# Patient Record
Sex: Female | Born: 1990 | Race: White | Hispanic: No | Marital: Married | Smoking: Never smoker
Health system: Southern US, Community
[De-identification: ages and names within clinical notes are randomized; demographics above are authoritative.]

## PROBLEM LIST (undated history)

## (undated) ENCOUNTER — Inpatient Hospital Stay (HOSPITAL_COMMUNITY): Payer: Self-pay

## (undated) DIAGNOSIS — N809 Endometriosis, unspecified: Secondary | ICD-10-CM

## (undated) DIAGNOSIS — H8090 Unspecified otosclerosis, unspecified ear: Secondary | ICD-10-CM

## (undated) DIAGNOSIS — E039 Hypothyroidism, unspecified: Secondary | ICD-10-CM

## (undated) DIAGNOSIS — R112 Nausea with vomiting, unspecified: Secondary | ICD-10-CM

## (undated) DIAGNOSIS — T8859XA Other complications of anesthesia, initial encounter: Secondary | ICD-10-CM

## (undated) DIAGNOSIS — Z9889 Other specified postprocedural states: Secondary | ICD-10-CM

## (undated) DIAGNOSIS — R000 Tachycardia, unspecified: Secondary | ICD-10-CM

## (undated) DIAGNOSIS — I471 Supraventricular tachycardia, unspecified: Secondary | ICD-10-CM

## (undated) DIAGNOSIS — Z789 Other specified health status: Secondary | ICD-10-CM

## (undated) DIAGNOSIS — R748 Abnormal levels of other serum enzymes: Secondary | ICD-10-CM

## (undated) DIAGNOSIS — R7303 Prediabetes: Secondary | ICD-10-CM

## (undated) DIAGNOSIS — F419 Anxiety disorder, unspecified: Secondary | ICD-10-CM

## (undated) DIAGNOSIS — M1990 Unspecified osteoarthritis, unspecified site: Secondary | ICD-10-CM

## (undated) HISTORY — DX: Nausea with vomiting, unspecified: R11.2

## (undated) HISTORY — DX: Tachycardia, unspecified: R00.0

## (undated) HISTORY — PX: NO PAST SURGERIES: SHX2092

## (undated) HISTORY — DX: Endometriosis, unspecified: N80.9

## (undated) HISTORY — PX: OTHER SURGICAL HISTORY: SHX169

## (undated) HISTORY — PX: LAPAROSCOPIC REMOVAL OF MESENTERIC MASS: SHX5917

## (undated) HISTORY — DX: Unspecified otosclerosis, unspecified ear: H80.90

---
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
## 2013-12-12 LAB — OB RESULTS CONSOLE HEPATITIS B SURFACE ANTIGEN: Hepatitis B Surface Ag: NEGATIVE

## 2013-12-12 LAB — OB RESULTS CONSOLE HIV ANTIBODY (ROUTINE TESTING): HIV: NONREACTIVE

## 2013-12-12 LAB — OB RESULTS CONSOLE GC/CHLAMYDIA
Chlamydia: NEGATIVE
Gonorrhea: NEGATIVE

## 2013-12-12 LAB — OB RESULTS CONSOLE ABO/RH: RH TYPE: POSITIVE

## 2013-12-12 LAB — OB RESULTS CONSOLE ANTIBODY SCREEN: ANTIBODY SCREEN: NEGATIVE

## 2013-12-12 LAB — OB RESULTS CONSOLE RPR: RPR: NONREACTIVE

## 2013-12-12 LAB — OB RESULTS CONSOLE RUBELLA ANTIBODY, IGM: Rubella: IMMUNE

## 2014-03-08 ENCOUNTER — Inpatient Hospital Stay (HOSPITAL_COMMUNITY): Admission: AD | Admit: 2014-03-08 | Payer: Self-pay | Source: Ambulatory Visit | Admitting: Obstetrics and Gynecology

## 2014-05-22 LAB — OB RESULTS CONSOLE GBS: STREP GROUP B AG: NEGATIVE

## 2014-06-12 ENCOUNTER — Encounter (HOSPITAL_COMMUNITY): Payer: Self-pay | Admitting: *Deleted

## 2014-06-12 ENCOUNTER — Inpatient Hospital Stay (HOSPITAL_COMMUNITY)
Admission: AD | Admit: 2014-06-12 | Discharge: 2014-06-16 | DRG: 765 | Disposition: A | Discharge status: Home / Self Care | Payer: PRIVATE HEALTH INSURANCE | Source: Ambulatory Visit | Attending: Obstetrics and Gynecology | Admitting: Obstetrics and Gynecology

## 2014-06-12 DIAGNOSIS — Z3A38 38 weeks gestation of pregnancy: Secondary | ICD-10-CM | POA: Diagnosis present

## 2014-06-12 DIAGNOSIS — O1493 Unspecified pre-eclampsia, third trimester: Secondary | ICD-10-CM | POA: Diagnosis present

## 2014-06-12 DIAGNOSIS — O133 Gestational [pregnancy-induced] hypertension without significant proteinuria, third trimester: Secondary | ICD-10-CM | POA: Diagnosis present

## 2014-06-12 DIAGNOSIS — Z9889 Other specified postprocedural states: Secondary | ICD-10-CM

## 2014-06-12 DIAGNOSIS — O139 Gestational [pregnancy-induced] hypertension without significant proteinuria, unspecified trimester: Secondary | ICD-10-CM | POA: Diagnosis present

## 2014-06-12 DIAGNOSIS — I10 Essential (primary) hypertension: Secondary | ICD-10-CM | POA: Diagnosis present

## 2014-06-12 HISTORY — DX: Other specified health status: Z78.9

## 2014-06-12 LAB — LACTATE DEHYDROGENASE: LDH: 141 U/L (ref 98–192)

## 2014-06-12 LAB — COMPREHENSIVE METABOLIC PANEL
ALBUMIN: 3.2 g/dL — AB (ref 3.5–5.0)
ALT: 14 U/L (ref 14–54)
ANION GAP: 10 (ref 5–15)
AST: 23 U/L (ref 15–41)
Alkaline Phosphatase: 172 U/L — ABNORMAL HIGH (ref 38–126)
BUN: 8 mg/dL (ref 6–20)
CALCIUM: 9.1 mg/dL (ref 8.9–10.3)
CO2: 20 mmol/L — AB (ref 22–32)
CREATININE: 0.71 mg/dL (ref 0.44–1.00)
Chloride: 105 mmol/L (ref 101–111)
GFR calc non Af Amer: >60 mL/min (ref >60)
Glucose, Bld: 127 mg/dL — ABNORMAL HIGH (ref 70–99)
Potassium: 3.7 mmol/L (ref 3.5–5.1)
Sodium: 135 mmol/L (ref 135–145)
Total Bilirubin: 0.1 mg/dL — ABNORMAL LOW (ref 0.3–1.2)
Total Protein: 6.7 g/dL (ref 6.5–8.1)

## 2014-06-12 LAB — CBC
HEMATOCRIT: 37.9 % (ref 36.0–46.0)
Hemoglobin: 13 g/dL (ref 12.0–15.0)
MCH: 31.6 pg (ref 26.0–34.0)
MCHC: 34.3 g/dL (ref 30.0–36.0)
MCV: 92 fL (ref 78.0–100.0)
Platelets: 160 10*3/uL (ref 150–400)
RBC: 4.12 MIL/uL (ref 3.87–5.11)
RDW: 13.5 % (ref 11.5–15.5)
WBC: 13.3 10*3/uL — ABNORMAL HIGH (ref 4.0–10.5)

## 2014-06-12 LAB — PROTEIN / CREATININE RATIO, URINE
Creatinine, Urine: 88 mg/dL
PROTEIN CREATININE RATIO: 0.31 mg/mg{creat} — AB (ref 0.00–0.15)
Total Protein, Urine: 27 mg/dL

## 2014-06-12 LAB — URIC ACID: Uric Acid, Serum: 6.2 mg/dL (ref 2.3–6.6)

## 2014-06-12 LAB — ABO/RH: ABO/RH(D): A POS

## 2014-06-12 LAB — TYPE AND SCREEN
ABO/RH(D): A POS
Antibody Screen: NEGATIVE

## 2014-06-12 MED ORDER — DIPHENHYDRAMINE HCL 50 MG/ML IJ SOLN: 12.5000 mg | INTRAMUSCULAR | Status: DC | PRN

## 2014-06-12 MED ORDER — OXYTOCIN 40 UNITS IN LACTATED RINGERS INFUSION - SIMPLE MED
1.0000 m[IU]/min | INTRAVENOUS | Status: DC
  Administered 2014-06-13: 2 m[IU]/min via INTRAVENOUS
  Administered 2014-06-13: 4 m[IU]/min via INTRAVENOUS
  Administered 2014-06-13: 8 m[IU]/min via INTRAVENOUS
  Administered 2014-06-13: 6 m[IU]/min via INTRAVENOUS
  Filled 2014-06-12: qty 1000

## 2014-06-12 MED ORDER — TERBUTALINE SULFATE 1 MG/ML IJ SOLN: 0.2500 mg | Freq: Once | INTRAMUSCULAR | Status: AC | PRN

## 2014-06-12 MED ORDER — OXYCODONE-ACETAMINOPHEN 5-325 MG PO TABS: 1.0000 | ORAL_TABLET | ORAL | Status: DC | PRN

## 2014-06-12 MED ORDER — LACTATED RINGERS IV SOLN
500.0000 mL | INTRAVENOUS | Status: DC | PRN
  Administered 2014-06-13: 500 mL via INTRAVENOUS

## 2014-06-12 MED ORDER — LIDOCAINE HCL (PF) 1 % IJ SOLN: 30.0000 mL | INTRAMUSCULAR | Status: DC | PRN

## 2014-06-12 MED ORDER — BUTORPHANOL TARTRATE 1 MG/ML IJ SOLN
1.0000 mg | INTRAMUSCULAR | Status: DC | PRN
  Administered 2014-06-13 (×2): 1 mg via INTRAVENOUS
  Filled 2014-06-12 (×2): qty 1

## 2014-06-12 MED ORDER — ACETAMINOPHEN 325 MG PO TABS
650.0000 mg | ORAL_TABLET | ORAL | Status: DC | PRN
  Administered 2014-06-13: 650 mg via ORAL
  Filled 2014-06-12: qty 2

## 2014-06-12 MED ORDER — OXYTOCIN BOLUS FROM INFUSION: 500.0000 mL | INTRAVENOUS | Status: DC

## 2014-06-12 MED ORDER — FENTANYL 2.5 MCG/ML BUPIVACAINE 1/10 % EPIDURAL INFUSION (WH - ANES)
14.0000 mL/h | INTRAMUSCULAR | Status: DC | PRN
  Administered 2014-06-13 (×3): 14 mL/h via EPIDURAL
  Filled 2014-06-12 (×2): qty 125

## 2014-06-12 MED ORDER — BUTORPHANOL TARTRATE 1 MG/ML IJ SOLN: 1.0000 mg | Freq: Once | INTRAMUSCULAR | Status: DC

## 2014-06-12 MED ORDER — EPHEDRINE 5 MG/ML INJ: 10.0000 mg | INTRAVENOUS | Status: DC | PRN

## 2014-06-12 MED ORDER — LACTATED RINGERS IV SOLN
INTRAVENOUS | Status: DC
  Administered 2014-06-12 – 2014-06-13 (×3): via INTRAVENOUS
  Administered 2014-06-13: 125 mL via INTRAVENOUS

## 2014-06-12 MED ORDER — CITRIC ACID-SODIUM CITRATE 334-500 MG/5ML PO SOLN
30.0000 mL | ORAL | Status: DC | PRN
  Administered 2014-06-14: 30 mL via ORAL
  Filled 2014-06-12: qty 15

## 2014-06-12 MED ORDER — ONDANSETRON HCL 4 MG/2ML IJ SOLN: 4.0000 mg | Freq: Four times a day (QID) | INTRAMUSCULAR | Status: DC | PRN

## 2014-06-12 MED ORDER — PHENYLEPHRINE 40 MCG/ML (10ML) SYRINGE FOR IV PUSH (FOR BLOOD PRESSURE SUPPORT)
80.0000 ug | PREFILLED_SYRINGE | INTRAVENOUS | Status: DC | PRN
  Filled 2014-06-12: qty 20

## 2014-06-12 MED ORDER — MISOPROSTOL 25 MCG QUARTER TABLET
25.0000 ug | ORAL_TABLET | ORAL | Status: DC | PRN
  Administered 2014-06-12: 25 ug via VAGINAL
  Filled 2014-06-12: qty 0.25

## 2014-06-12 MED ORDER — OXYCODONE-ACETAMINOPHEN 5-325 MG PO TABS: 2.0000 | ORAL_TABLET | ORAL | Status: DC | PRN

## 2014-06-12 MED ORDER — OXYTOCIN 40 UNITS IN LACTATED RINGERS INFUSION - SIMPLE MED: 62.5000 mL/h | INTRAVENOUS | Status: DC

## 2014-06-12 NOTE — Progress Notes (Signed)
Raven [name] is a 24 y.o. G1P0 at [redacted]w[redacted]d by LMP admitted for induction of labor due to Hypertension.  Subjective: Patient starting to feel uncomfortable  Objective: BP 125/84 mmHg  Pulse 95  Temp(Src) 98.4 F (36.9 C) (Oral)  Resp 20  Ht 5\' 4"  (1.626 m)  Wt 101.152 kg (223 lb)  BMI 38.26 kg/m2  LMP 09/16/2013      FHT:  FHR: 130 bpm, variability: moderate,  accelerations:  Present,  decelerations:  Absent UC:   regular, every 23 minutes SVE:   Dilation: Fingertip Effacement (%): 50 Station: -3 Exam by:: Ronni Rumble, RN  Labs: Lab Results  Component Value Date   WBC 13.3* 06/12/2014   HGB 13.0 06/12/2014   HCT 37.9 06/12/2014   MCV 92.0 06/12/2014   PLT 160 06/12/2014    Assessment / Plan: Induction of labor due to gestational hypertension Patient contracting too much to place another cytotec, so foley bulb  Placed. Will start pitocin if her ctx space out  Labor: latent stage Preeclampsia:  no signs or symptoms of toxicity Fetal Wellbeing:  Category I Pain Control:  Labor support without medications I/D:  n/a Anticipated MOD:  NSVD  Raven [name] STACIA 06/12/2014, 11:21 PM

## 2014-06-12 NOTE — Progress Notes (Signed)
Dr Alwyn Pea made aware of Protein/Creat. Ration results and other labs. No orders received. Will continue to monitor.

## 2014-06-12 NOTE — Progress Notes (Signed)
Dr Alwyn Pea made aware of patient contracting too much for next dose of cytotec. Will come to bedside to assess.

## 2014-06-12 NOTE — H&P (Signed)
Raven [name] is a 24 y.o. female G1P0 at 57 weeks and 3 days presenting from office for direct admission for elevated BPs  To 150s/ 100's.  Patient with elevated BPs on several prenatal visits 130's / 90s. Today she reports Feeling like "I am dying".  She reports a headache, no blurry vision, no scotomata, no RUQ pain.  She is having irregular ctx, no vaginal bleeding no leaking of fluid, normal fetal movements.  Maternal Medical History:  Reason for admission: Nausea.  Contractions: Onset was 3-5 hours ago.   Frequency: irregular.   Duration is approximately 60 seconds.   Perceived severity is moderate.    Fetal activity: Perceived fetal activity is normal.   Last perceived fetal movement was within the past 12 hours.    Prenatal complications: PIH.   Prenatal Complications - Diabetes: none.    OB History    Gravida Para Term Preterm AB TAB SAB Ectopic Multiple Living   1              Past Medical History  Diagnosis Date  . Medical history non-contributory    Past Surgical History  Procedure Laterality Date  . No past surgeries     Family History: family history is not on file. Social History:  reports that she has never smoked. She has never used smokeless tobacco. She reports that she does not drink alcohol or use illicit drugs.   Prenatal Transfer Tool  Maternal Diabetes: No Genetic Screening: Normal Maternal Ultrasounds/Referrals: Normal Fetal Ultrasounds or other Referrals:  Other:  Anatomy US normal  Maternal Substance Abuse:  No Significant Maternal Medications:  None Significant Maternal Lab Results:  Lab values include: Group B Strep negative Other Comments:  None  Review of Systems  Constitutional: Negative for fever and chills.  Eyes: Negative for blurred vision and double vision.  Respiratory: Negative for hemoptysis.   Cardiovascular: Negative for chest pain and palpitations.  Gastrointestinal: Negative for heartburn and nausea.   Genitourinary: Negative for dysuria and urgency.  Skin: Negative for itching and rash.  Neurological: Positive for headaches. Negative for dizziness.  All other systems reviewed and are negative.   Dilation: Fingertip Effacement (%): 50 Station: -3 Exam by:: L. Cresenzo, RN Blood pressure 130/94, pulse 99, temperature 99.1 F (37.3 C), temperature source Oral, resp. rate 18, height 5\' 4"  (1.626 m), weight 101.152 kg (223 lb), last menstrual period 09/16/2013. Maternal Exam:  Uterine Assessment: Contraction strength is mild.  Contraction duration is 40 seconds. Contraction frequency is irregular.   Abdomen: Patient reports no abdominal tenderness. Fundal height is 38 cm.   Estimated fetal weight is 3200 grams.   Fetal presentation: vertex  Introitus: Normal vulva. Normal vagina.  Ferning test: not done.  Nitrazine test: not done. Amniotic fluid character: not assessed.  Pelvis: adequate for delivery.   Cervix: Cervix evaluated by digital exam.   Finger tip / long / posterior  Fetal Exam Fetal Monitor Review: Mode: hand-held doppler probe.   Baseline rate: 130.  Variability: moderate (6-25 bpm).   Pattern: accelerations present and no decelerations.    Fetal State Assessment: Category I - tracings are normal.     Physical Exam  Vitals reviewed. Constitutional: She is oriented to person, place, and time. She appears well-developed and well-nourished.  HENT:  Head: Normocephalic and atraumatic.  Eyes: Pupils are equal, round, and reactive to light.  Neck: Normal range of motion.  Cardiovascular: Normal rate and regular rhythm.   Respiratory: Effort normal.  GI:  Soft.  Genitourinary: Vagina normal.  Musculoskeletal: Normal range of motion.  Neurological: She is alert and oriented to person, place, and time. She has normal reflexes.  Skin: Skin is warm.  Psychiatric: She has a normal mood and affect. Her behavior is normal.    Prenatal labs: ABO, Rh: --/--/A POS  (05/02 1645) Antibody: NEG (05/02 1645) Rubella: Immune (11/02 0000) RPR: Nonreactive (11/02 0000)  HBsAg: Negative (11/02 0000)  HIV: Non-reactive (11/02 0000)  GBS: Negative (04/11 0000)   Assessment/Plan: 24 yo G1P0 SIUP at 38 weeks 3 days with gestational hypertension, possible preeclampsia with severe features.  Admit to labor and delivery for induction of labor Misoprostol for cervical ripening then pitocin for augmentation.  PIH labs normal except for protein / creatinine ration 0.31 Will hold on magnesium sulfate for seizure prophylaxis for now.  Epidural on demand.    Raven [name], [place] 06/12/2014, 7:32 PM

## 2014-06-13 ENCOUNTER — Inpatient Hospital Stay (HOSPITAL_COMMUNITY): Payer: PRIVATE HEALTH INSURANCE | Admitting: Anesthesiology

## 2014-06-13 LAB — CBC WITH DIFFERENTIAL/PLATELET
BASOS ABS: 0 10*3/uL (ref 0.0–0.1)
BASOS PCT: 0 % (ref 0–1)
Eosinophils Absolute: 0 10*3/uL (ref 0.0–0.7)
Eosinophils Relative: 0 % (ref 0–5)
HEMATOCRIT: 35.8 % — AB (ref 36.0–46.0)
HEMOGLOBIN: 12 g/dL (ref 12.0–15.0)
Lymphocytes Relative: 15 % (ref 12–46)
Lymphs Abs: 1.8 10*3/uL (ref 0.7–4.0)
MCH: 31.4 pg (ref 26.0–34.0)
MCHC: 33.5 g/dL (ref 30.0–36.0)
MCV: 93.7 fL (ref 78.0–100.0)
MONO ABS: 0.9 10*3/uL (ref 0.1–1.0)
Monocytes Relative: 7 % (ref 3–12)
Neutro Abs: 9.9 10*3/uL — ABNORMAL HIGH (ref 1.7–7.7)
Neutrophils Relative %: 78 % — ABNORMAL HIGH (ref 43–77)
Platelets: 139 10*3/uL — ABNORMAL LOW (ref 150–400)
RBC: 3.82 MIL/uL — ABNORMAL LOW (ref 3.87–5.11)
RDW: 13.8 % (ref 11.5–15.5)
WBC: 12.6 10*3/uL — ABNORMAL HIGH (ref 4.0–10.5)

## 2014-06-13 LAB — RPR: RPR Ser Ql: NONREACTIVE

## 2014-06-13 MED ORDER — ZOLPIDEM TARTRATE 5 MG PO TABS
5.0000 mg | ORAL_TABLET | Freq: Once | ORAL | Status: AC
  Administered 2014-06-13: 5 mg via ORAL
  Filled 2014-06-13: qty 1

## 2014-06-13 MED ORDER — FENTANYL 2.5 MCG/ML BUPIVACAINE 1/10 % EPIDURAL INFUSION (WH - ANES): 14.0000 mL/h | INTRAMUSCULAR | Status: DC | PRN

## 2014-06-13 MED ORDER — LIDOCAINE HCL (PF) 1 % IJ SOLN
INTRAMUSCULAR | Status: DC | PRN
  Administered 2014-06-13: 10 mL

## 2014-06-13 NOTE — Anesthesia Procedure Notes (Signed)
Epidural Patient location during procedure: OB Start time: 06/13/2014 12:02 PM End time: 06/13/2014 12:17 PM  Staffing Anesthesiologist: Alexis Frock  Preanesthetic Checklist Completed: patient identified, site marked, surgical consent, pre-op evaluation, timeout performed, IV checked, risks and benefits discussed and monitors and equipment checked  Epidural Patient position: sitting Prep: site prepped and draped and DuraPrep Patient monitoring: heart rate, continuous pulse ox and blood pressure Approach: midline Location: L3-L4 Injection technique: LOR saline and LOR air  Needle:  Needle type: Tuohy  Needle gauge: 17 G Needle length: 9 cm and 9 Needle insertion depth: 6 cm Catheter type: closed end flexible Catheter size: 19 Gauge Catheter at skin depth: 14 cm Test dose: negative  Assessment Events: blood not aspirated, injection not painful, no injection resistance, negative IV test and no paresthesia  Additional Notes   Patient tolerated the insertion well without complications.Reason for block:procedure for pain

## 2014-06-13 NOTE — Progress Notes (Signed)
Removed foley bulb-  SVE ft to 1/50/high [place] bedside VTX  FHTs 120s gSTV, NST R Toco q 5  Will start pitocin.

## 2014-06-13 NOTE — Progress Notes (Signed)
RN Assuming care of patient

## 2014-06-13 NOTE — Progress Notes (Signed)
3/70/-2 SROM earlier at 10 am, clear NST R

## 2014-06-13 NOTE — Anesthesia Preprocedure Evaluation (Signed)
Anesthesia Evaluation  Patient identified by MRN, date of birth, ID band Patient awake and Patient confused    Reviewed: Allergy & Precautions, H&P , NPO status , Patient's Chart, lab work & pertinent test results  Airway Mallampati: II       Dental   Pulmonary  breath sounds clear to auscultation  Pulmonary exam normal       Cardiovascular Exercise Tolerance: Good hypertension, Rhythm:regular Rate:Normal     Neuro/Psych    GI/Hepatic   Endo/Other    Renal/GU      Musculoskeletal   Abdominal Normal abdominal exam  (+)   Peds  Hematology   Anesthesia Other Findings   Reproductive/Obstetrics (+) Pregnancy Mild HTN                             Anesthesia Physical Anesthesia Plan  ASA: II  Anesthesia Plan: Epidural   Post-op Pain Management:    Induction:   Airway Management Planned:   Additional Equipment:   Intra-op Plan:   Post-operative Plan:   Informed Consent: I have reviewed the patients History and Physical, chart, labs and discussed the procedure including the risks, benefits and alternatives for the proposed anesthesia with the patient or authorized representative who has indicated his/her understanding and acceptance.     Plan Discussed with:   Anesthesia Plan Comments:         Anesthesia Quick Evaluation

## 2014-06-14 ENCOUNTER — Encounter (HOSPITAL_COMMUNITY): Payer: Self-pay | Admitting: *Deleted

## 2014-06-14 ENCOUNTER — Encounter (HOSPITAL_COMMUNITY)
Admission: AD | Disposition: A | Discharge status: Home / Self Care | Payer: Self-pay | Source: Ambulatory Visit | Attending: Obstetrics and Gynecology

## 2014-06-14 DIAGNOSIS — Z9889 Other specified postprocedural states: Secondary | ICD-10-CM

## 2014-06-14 LAB — CBC
HCT: 33.3 % — ABNORMAL LOW (ref 36.0–46.0)
HCT: 29.8 % — ABNORMAL LOW (ref 36.0–46.0)
HEMOGLOBIN: 10.9 g/dL — AB (ref 12.0–15.0)
HEMOGLOBIN: 9.9 g/dL — AB (ref 12.0–15.0)
MCH: 31 pg (ref 26.0–34.0)
MCH: 31.4 pg (ref 26.0–34.0)
MCHC: 32.7 g/dL (ref 30.0–36.0)
MCHC: 33.2 g/dL (ref 30.0–36.0)
MCV: 94.6 fL (ref 78.0–100.0)
MCV: 94.6 fL (ref 78.0–100.0)
Platelets: 128 10*3/uL — ABNORMAL LOW (ref 150–400)
Platelets: 172 10*3/uL (ref 150–400)
RBC: 3.52 MIL/uL — AB (ref 3.87–5.11)
RBC: 3.15 MIL/uL — ABNORMAL LOW (ref 3.87–5.11)
RDW: 13.5 % (ref 11.5–15.5)
RDW: 13.6 % (ref 11.5–15.5)
WBC: 16.8 10*3/uL — AB (ref 4.0–10.5)
WBC: 18.8 10*3/uL — ABNORMAL HIGH (ref 4.0–10.5)

## 2014-06-14 SURGERY — Surgical Case
Anesthesia: Regional

## 2014-06-14 MED ORDER — FLEET ENEMA 7-19 GM/118ML RE ENEM: 1.0000 | ENEMA | Freq: Every day | RECTAL | Status: DC | PRN

## 2014-06-14 MED ORDER — NALBUPHINE HCL 10 MG/ML IJ SOLN: 5.0000 mg | Freq: Once | INTRAMUSCULAR | Status: AC | PRN

## 2014-06-14 MED ORDER — IBUPROFEN 600 MG PO TABS
600.0000 mg | ORAL_TABLET | Freq: Four times a day (QID) | ORAL | Status: DC
  Administered 2014-06-14 – 2014-06-16 (×9): 600 mg via ORAL
  Filled 2014-06-14 (×9): qty 1

## 2014-06-14 MED ORDER — MEPERIDINE HCL 25 MG/ML IJ SOLN
INTRAMUSCULAR | Status: AC
  Filled 2014-06-14: qty 1

## 2014-06-14 MED ORDER — ACETAMINOPHEN 325 MG PO TABS: 650.0000 mg | ORAL_TABLET | ORAL | Status: DC | PRN

## 2014-06-14 MED ORDER — OXYTOCIN 10 UNIT/ML IJ SOLN
40.0000 [IU] | INTRAVENOUS | Status: DC | PRN
  Administered 2014-06-14: 40 [IU] via INTRAVENOUS

## 2014-06-14 MED ORDER — DEXAMETHASONE SODIUM PHOSPHATE 10 MG/ML IJ SOLN
INTRAMUSCULAR | Status: DC | PRN
  Administered 2014-06-14: 10 mg via INTRAVENOUS

## 2014-06-14 MED ORDER — SIMETHICONE 80 MG PO CHEW
80.0000 mg | CHEWABLE_TABLET | ORAL | Status: DC
  Administered 2014-06-14 – 2014-06-16 (×2): 80 mg via ORAL
  Filled 2014-06-14 (×2): qty 1

## 2014-06-14 MED ORDER — ONDANSETRON HCL 4 MG/2ML IJ SOLN
INTRAMUSCULAR | Status: DC | PRN
  Administered 2014-06-14: 4 mg via INTRAVENOUS

## 2014-06-14 MED ORDER — LANOLIN HYDROUS EX OINT: 1.0000 "application " | TOPICAL_OINTMENT | CUTANEOUS | Status: DC | PRN

## 2014-06-14 MED ORDER — ONDANSETRON HCL 4 MG/2ML IJ SOLN: 4.0000 mg | Freq: Three times a day (TID) | INTRAMUSCULAR | Status: DC | PRN

## 2014-06-14 MED ORDER — SENNOSIDES-DOCUSATE SODIUM 8.6-50 MG PO TABS
2.0000 | ORAL_TABLET | ORAL | Status: DC
  Administered 2014-06-14 – 2014-06-16 (×2): 2 via ORAL
  Filled 2014-06-14 (×2): qty 2

## 2014-06-14 MED ORDER — NALBUPHINE HCL 10 MG/ML IJ SOLN: 5.0000 mg | INTRAMUSCULAR | Status: DC | PRN

## 2014-06-14 MED ORDER — MORPHINE SULFATE 0.5 MG/ML IJ SOLN
INTRAMUSCULAR | Status: AC
  Filled 2014-06-14: qty 10

## 2014-06-14 MED ORDER — SODIUM BICARBONATE 8.4 % IV SOLN
INTRAVENOUS | Status: AC
  Filled 2014-06-14: qty 50

## 2014-06-14 MED ORDER — OXYTOCIN 40 UNITS IN LACTATED RINGERS INFUSION - SIMPLE MED
62.5000 mL/h | INTRAVENOUS | Status: AC
Start: 2014-06-14 — End: 2014-06-14

## 2014-06-14 MED ORDER — MEPERIDINE HCL 25 MG/ML IJ SOLN
INTRAMUSCULAR | Status: DC | PRN
  Administered 2014-06-14 (×2): 12.5 mg via INTRAVENOUS

## 2014-06-14 MED ORDER — SIMETHICONE 80 MG PO CHEW
80.0000 mg | CHEWABLE_TABLET | Freq: Three times a day (TID) | ORAL | Status: DC
  Administered 2014-06-14 – 2014-06-16 (×6): 80 mg via ORAL
  Filled 2014-06-14 (×7): qty 1

## 2014-06-14 MED ORDER — OXYCODONE-ACETAMINOPHEN 5-325 MG PO TABS: 2.0000 | ORAL_TABLET | ORAL | Status: DC | PRN

## 2014-06-14 MED ORDER — METHYLERGONOVINE MALEATE 0.2 MG PO TABS: 0.2000 mg | ORAL_TABLET | ORAL | Status: DC | PRN

## 2014-06-14 MED ORDER — LIDOCAINE-EPINEPHRINE (PF) 2 %-1:200000 IJ SOLN
INTRAMUSCULAR | Status: AC
  Filled 2014-06-14: qty 20

## 2014-06-14 MED ORDER — OXYTOCIN 10 UNIT/ML IJ SOLN
INTRAMUSCULAR | Status: AC
  Filled 2014-06-14: qty 4

## 2014-06-14 MED ORDER — PHENYLEPHRINE 40 MCG/ML (10ML) SYRINGE FOR IV PUSH (FOR BLOOD PRESSURE SUPPORT)
PREFILLED_SYRINGE | INTRAVENOUS | Status: AC
  Filled 2014-06-14: qty 20

## 2014-06-14 MED ORDER — DIPHENHYDRAMINE HCL 25 MG PO CAPS: 25.0000 mg | ORAL_CAPSULE | Freq: Four times a day (QID) | ORAL | Status: DC | PRN

## 2014-06-14 MED ORDER — DEXTROSE 5 % IV SOLN
1.0000 ug/kg/h | INTRAVENOUS | Status: DC | PRN
  Filled 2014-06-14: qty 2

## 2014-06-14 MED ORDER — SODIUM CHLORIDE 0.9 % IJ SOLN: 3.0000 mL | INTRAMUSCULAR | Status: DC | PRN

## 2014-06-14 MED ORDER — DIPHENHYDRAMINE HCL 25 MG PO CAPS
25.0000 mg | ORAL_CAPSULE | ORAL | Status: DC | PRN
  Administered 2014-06-16: 25 mg via ORAL
  Filled 2014-06-14: qty 1

## 2014-06-14 MED ORDER — MENTHOL 3 MG MT LOZG: 1.0000 | LOZENGE | OROMUCOSAL | Status: DC | PRN

## 2014-06-14 MED ORDER — NALOXONE HCL 0.4 MG/ML IJ SOLN: 0.4000 mg | INTRAMUSCULAR | Status: DC | PRN

## 2014-06-14 MED ORDER — DEXAMETHASONE SODIUM PHOSPHATE 10 MG/ML IJ SOLN
INTRAMUSCULAR | Status: AC
  Filled 2014-06-14: qty 1

## 2014-06-14 MED ORDER — ONDANSETRON HCL 4 MG/2ML IJ SOLN
INTRAMUSCULAR | Status: AC
  Filled 2014-06-14: qty 2

## 2014-06-14 MED ORDER — MEPERIDINE HCL 25 MG/ML IJ SOLN: 6.2500 mg | INTRAMUSCULAR | Status: DC | PRN

## 2014-06-14 MED ORDER — OXYCODONE-ACETAMINOPHEN 5-325 MG PO TABS
1.0000 | ORAL_TABLET | ORAL | Status: DC | PRN
  Administered 2014-06-15 – 2014-06-16 (×4): 1 via ORAL
  Filled 2014-06-14 (×4): qty 1

## 2014-06-14 MED ORDER — SODIUM BICARBONATE 8.4 % IV SOLN
INTRAVENOUS | Status: DC | PRN
  Administered 2014-06-14: 6 mL via EPIDURAL
  Administered 2014-06-14: 4 mL via EPIDURAL
  Administered 2014-06-14: 5 mL via EPIDURAL

## 2014-06-14 MED ORDER — FENTANYL CITRATE (PF) 100 MCG/2ML IJ SOLN
25.0000 ug | INTRAMUSCULAR | Status: DC | PRN
  Administered 2014-06-14 (×2): 50 ug via INTRAVENOUS

## 2014-06-14 MED ORDER — LACTATED RINGERS IV SOLN
INTRAVENOUS | Status: DC | PRN
  Administered 2014-06-14: 03:00:00 via INTRAVENOUS

## 2014-06-14 MED ORDER — SCOPOLAMINE 1 MG/3DAYS TD PT72
MEDICATED_PATCH | TRANSDERMAL | Status: DC | PRN
  Administered 2014-06-14: 1 via TRANSDERMAL

## 2014-06-14 MED ORDER — FENTANYL CITRATE (PF) 100 MCG/2ML IJ SOLN
INTRAMUSCULAR | Status: AC
  Administered 2014-06-14: 50 ug via INTRAVENOUS
  Filled 2014-06-14: qty 2

## 2014-06-14 MED ORDER — LACTATED RINGERS IV SOLN
INTRAVENOUS | Status: DC
  Administered 2014-06-14: 17:00:00 via INTRAVENOUS

## 2014-06-14 MED ORDER — WITCH HAZEL-GLYCERIN EX PADS: 1.0000 "application " | MEDICATED_PAD | CUTANEOUS | Status: DC | PRN

## 2014-06-14 MED ORDER — TETANUS-DIPHTH-ACELL PERTUSSIS 5-2.5-18.5 LF-MCG/0.5 IM SUSP: 0.5000 mL | Freq: Once | INTRAMUSCULAR | Status: DC

## 2014-06-14 MED ORDER — PRENATAL MULTIVITAMIN CH
1.0000 | ORAL_TABLET | Freq: Every day | ORAL | Status: DC
  Administered 2014-06-14 – 2014-06-15 (×2): 1 via ORAL
  Filled 2014-06-14 (×3): qty 1

## 2014-06-14 MED ORDER — FERROUS SULFATE 325 (65 FE) MG PO TABS
325.0000 mg | ORAL_TABLET | Freq: Two times a day (BID) | ORAL | Status: DC
  Administered 2014-06-14 – 2014-06-15 (×4): 325 mg via ORAL
  Filled 2014-06-14 (×5): qty 1

## 2014-06-14 MED ORDER — MEASLES, MUMPS & RUBELLA VAC ~~LOC~~ INJ
0.5000 mL | INJECTION | Freq: Once | SUBCUTANEOUS | Status: DC
  Filled 2014-06-14: qty 0.5

## 2014-06-14 MED ORDER — ONDANSETRON HCL 4 MG/2ML IJ SOLN: 4.0000 mg | Freq: Once | INTRAMUSCULAR | Status: DC | PRN

## 2014-06-14 MED ORDER — METHYLERGONOVINE MALEATE 0.2 MG/ML IJ SOLN: 0.2000 mg | INTRAMUSCULAR | Status: DC | PRN

## 2014-06-14 MED ORDER — SCOPOLAMINE 1 MG/3DAYS TD PT72
MEDICATED_PATCH | TRANSDERMAL | Status: AC
  Filled 2014-06-14: qty 1

## 2014-06-14 MED ORDER — CEFAZOLIN SODIUM-DEXTROSE 2-3 GM-% IV SOLR
INTRAVENOUS | Status: AC
  Filled 2014-06-14: qty 50

## 2014-06-14 MED ORDER — PHENYLEPHRINE HCL 10 MG/ML IJ SOLN
INTRAMUSCULAR | Status: DC | PRN
  Administered 2014-06-14 (×2): 80 ug via INTRAVENOUS

## 2014-06-14 MED ORDER — LACTATED RINGERS IV SOLN
INTRAVENOUS | Status: DC | PRN
  Administered 2014-06-14 (×3): via INTRAVENOUS

## 2014-06-14 MED ORDER — MORPHINE SULFATE (PF) 0.5 MG/ML IJ SOLN
INTRAMUSCULAR | Status: DC | PRN
  Administered 2014-06-14: 1 mg via INTRAVENOUS
  Administered 2014-06-14: 4 mg via EPIDURAL

## 2014-06-14 MED ORDER — SIMETHICONE 80 MG PO CHEW: 80.0000 mg | CHEWABLE_TABLET | ORAL | Status: DC | PRN

## 2014-06-14 MED ORDER — DIPHENHYDRAMINE HCL 50 MG/ML IJ SOLN: 12.5000 mg | INTRAMUSCULAR | Status: DC | PRN

## 2014-06-14 MED ORDER — FAMOTIDINE 20 MG PO TABS
20.0000 mg | ORAL_TABLET | Freq: Two times a day (BID) | ORAL | Status: DC
  Administered 2014-06-14 – 2014-06-15 (×4): 20 mg via ORAL
  Filled 2014-06-14 (×4): qty 1

## 2014-06-14 MED ORDER — PHENYLEPHRINE 8 MG IN D5W 100 ML (0.08MG/ML) PREMIX OPTIME
INJECTION | INTRAVENOUS | Status: DC | PRN
  Administered 2014-06-14: 50 ug/min via INTRAVENOUS

## 2014-06-14 MED ORDER — DIBUCAINE 1 % RE OINT: 1.0000 "application " | TOPICAL_OINTMENT | RECTAL | Status: DC | PRN

## 2014-06-14 MED ORDER — BISACODYL 10 MG RE SUPP: 10.0000 mg | Freq: Every day | RECTAL | Status: DC | PRN

## 2014-06-14 MED ORDER — SCOPOLAMINE 1 MG/3DAYS TD PT72
1.0000 | MEDICATED_PATCH | Freq: Once | TRANSDERMAL | Status: DC
  Filled 2014-06-14: qty 1

## 2014-06-14 MED ORDER — ZOLPIDEM TARTRATE 5 MG PO TABS: 5.0000 mg | ORAL_TABLET | Freq: Every evening | ORAL | Status: DC | PRN

## 2014-06-14 SURGICAL SUPPLY — 33 items
APL SKNCLS STERI-STRIP NONHPOA (GAUZE/BANDAGES/DRESSINGS) ×1
BENZOIN TINCTURE PRP APPL 2/3 (GAUZE/BANDAGES/DRESSINGS) ×3 IMPLANT
CLAMP CORD UMBIL (MISCELLANEOUS) IMPLANT
CLOSURE STERI STRIP 1/2 X4 (GAUZE/BANDAGES/DRESSINGS) ×2 IMPLANT
CLOTH BEACON ORANGE TIMEOUT ST (SAFETY) ×3 IMPLANT
DRAPE SHEET LG 3/4 BI-LAMINATE (DRAPES) IMPLANT
DRSG OPSITE POSTOP 4X10 (GAUZE/BANDAGES/DRESSINGS) ×3 IMPLANT
DURAPREP 26ML APPLICATOR (WOUND CARE) ×3 IMPLANT
ELECT REM PT RETURN 9FT ADLT (ELECTROSURGICAL) ×3
ELECTRODE REM PT RTRN 9FT ADLT (ELECTROSURGICAL) ×1 IMPLANT
EXTRACTOR VACUUM BELL STYLE (SUCTIONS) IMPLANT
GLOVE BIO SURGEON STRL SZ7 (GLOVE) ×3 IMPLANT
GOWN STRL REUS W/TWL LRG LVL3 (GOWN DISPOSABLE) ×6 IMPLANT
KIT ABG SYR 3ML LUER SLIP (SYRINGE) IMPLANT
NDL HYPO 25X5/8 SAFETYGLIDE (NEEDLE) IMPLANT
NEEDLE HYPO 25X5/8 SAFETYGLIDE (NEEDLE) IMPLANT
NS IRRIG 1000ML POUR BTL (IV SOLUTION) ×3 IMPLANT
PACK C SECTION WH (CUSTOM PROCEDURE TRAY) ×3 IMPLANT
PAD OB MATERNITY 4.3X12.25 (PERSONAL CARE ITEMS) ×3 IMPLANT
RTRCTR C-SECT PINK 25CM LRG (MISCELLANEOUS) ×3 IMPLANT
STAPLER VISISTAT 35W (STAPLE) IMPLANT
STRIP CLOSURE SKIN 1/2X4 (GAUZE/BANDAGES/DRESSINGS) ×2 IMPLANT
SUT MNCRL 0 VIOLET CTX 36 (SUTURE) ×2 IMPLANT
SUT MONOCRYL 0 CTX 36 (SUTURE) ×4
SUT PDS AB 0 CTX 60 (SUTURE) IMPLANT
SUT PLAIN 2 0 XLH (SUTURE) IMPLANT
SUT VIC AB 0 CT1 27 (SUTURE) ×6
SUT VIC AB 0 CT1 27XBRD ANBCTR (SUTURE) ×2 IMPLANT
SUT VIC AB 2-0 CT1 27 (SUTURE) ×3
SUT VIC AB 2-0 CT1 TAPERPNT 27 (SUTURE) ×1 IMPLANT
SUT VIC AB 4-0 KS 27 (SUTURE) ×3 IMPLANT
TOWEL OR 17X24 6PK STRL BLUE (TOWEL DISPOSABLE) ×3 IMPLANT
TRAY FOLEY CATH SILVER 14FR (SET/KITS/TRAYS/PACK) ×3 IMPLANT

## 2014-06-14 NOTE — Progress Notes (Signed)
FHTs had been reassuring but for last hour have changed.  Now 150s with decrease long term variability and persistent late decels.  SVE is still 3-4.  All R/B/Alt d/w pt about LTCS for nonreassuing FHTs remote from vaginal delivery and she agrees to proceed.

## 2014-06-14 NOTE — Lactation Note (Signed)
This note was copied from the chart of Raven [name]. Lactation Consultation Note  Patient Name: [name] [id number] Date: 06/14/2014 Reason for consult: Initial assessment;Difficult latch RN reports baby is not sustaining a latch. LC assisted mom with positioning and latching baby. Baby is tongue thrusting and sucking his tongue/lips. Baby has difficulty sustaining a good suckling pattern. Mom nipple/aerola is very compressible and with tea cup hold baby would latch off/on and give few good suckling bursts then come off the breast. Tried #20 nipple shield but baby could not obtain depth with nipple shield. Tried several positions including laid back but baby still had trouble sustaining depth with the latch. Baby nursed off/on for 15-20 minutes.  Suck training demonstrated, Mom given shells to wear and advised to pre-pump/hand express prior to latch. Mom has colostrum present with hand expression. Encouraged to BF with feeding ques. If baby is not latching advised Mom to start pumping, she has her own DEBP to use. Advised to pump every 3 hours for 15 minutes. Mom did report this feeding baby showed more interest at the breast. Advised Mom to keep baby STS when she can. Ask for assist with feedings till baby latching consistently. Lactation brochure left for review, advised of OP services and support group. RN advised Mom will need assist tonight.   Maternal Data Has patient been taught Hand Expression?: Yes Does the patient have breastfeeding experience prior to this delivery?: No  Feeding Feeding Type: Breast Fed Length of feed: 15 min (off/on)  LATCH Score/Interventions Latch: Repeated attempts needed to sustain latch, nipple held in mouth throughout feeding, stimulation needed to elicit sucking reflex. Intervention(s): Adjust position;Assist with latch;Breast massage;Breast compression  Audible Swallowing: None  Type of Nipple: Everted at rest and after stimulation (short  shaft bilateral, left slightly inverted) Intervention(s): Shells;Hand pump  Comfort (Breast/Nipple): Soft / non-tender     Hold (Positioning): Full assist, staff holds infant at breast Intervention(s): Breastfeeding basics reviewed;Support Pillows;Position options;Skin to skin  LATCH Score: 5  Lactation Tools Discussed/Used Tools: Pump;Shells;Nipple Shields Nipple shield size: 20 Shell Type: Inverted Breast pump type: Manual WIC Program: No   Consult Status Consult Status: Follow-up Date: 06/15/14 Follow-up type: In-patient    Katrine Coho 06/14/2014, 8:28 PM

## 2014-06-14 NOTE — Progress Notes (Addendum)
Called Dr. Rogue Bussing d/t trying to get patient up to bathroom and pt is still dizzy after 14 hours post delivery.  Patient had PIH during this pregnancy and her BPs have been between the 120s/70s all day.  Reported this to Dr. Rogue Bussing and she ordered a stat CBC for now.  Urine output is good, patient is not orthostatic and has good feeling in her legs, just dizzy and unsteady on her feet. Patient has no other concerns at this time.  Will call Dr. Rogue Bussing with stat CBC results. Explained to Dr. Rogue Bussing that I did not pull foley d/t unsteady gait and dizziness, she stated it was okay to leave in until morning if need be.

## 2014-06-14 NOTE — Addendum Note (Signed)
Addendum  created 06/14/14 1413 by Asher Muir, CRNA   Modules edited: Charges VN

## 2014-06-14 NOTE — Transfer of Care (Signed)
Immediate Anesthesia Transfer of Care Note  Patient: Raven [name]  Procedure(s) Performed: Procedure(s): CESAREAN SECTION (N/A)  Patient Location: PACU  Anesthesia Type:Epidural  Level of Consciousness: awake, alert , oriented and patient cooperative  Airway & Oxygen Therapy: Patient Spontanous Breathing  Post-op Assessment: Report given to RN and Post -op Vital signs reviewed and stable  Post vital signs: Reviewed and stable  Last Vitals:  Filed Vitals:   06/14/14 0251  BP: 117/71  Pulse: 116  Temp:   Resp:     Complications: No apparent anesthesia complications

## 2014-06-14 NOTE — Op Note (Signed)
06/12/2014 - 06/14/2014  3:41 AM  PATIENT:  Raven [name]  24 y.o. female  PRE-OPERATIVE DIAGNOSIS:  Non reassuring fetal heart tone remote from vaginal delivery  POST-OPERATIVE DIAGNOSIS:  Primary Cesearan Section  PROCEDURE:  Procedure(s): CESAREAN SECTION (N/A)  SURGEON:  Surgeon(s) and Role:    * Bobbye Charleston, MD - Primary   ANESTHESIA:   epidural  EBL:  Total I/O In: 1700 [I.V.:1700] Out: 650 [Urine:100; Blood:550]  SPECIMEN:  No Specimen  DISPOSITION OF SPECIMEN:  N/A  COUNTS:  YES  TOURNIQUET:  * No tourniquets in log *  DICTATION: .Note written in EPIC  PLAN OF CARE: Admit to inpatient   PATIENT DISPOSITION:  PACU - hemodynamically stable.   Delay start of Pharmacological VTE agent (>24hrs) due to surgical blood loss or risk of bleeding: not applicable  Complications:  none Medications:  Ancef, Pitocin Findings:  Baby female, Apgars 8,9, weight P.   Normal tubes, ovaries and uterus seen.  Baby was skin to skin with mother after birth in the OR.  Technique:  After adequate epidural anesthesia was achieved, the patient was prepped and draped in usual sterile fashion.  A foley catheter was used to drain the bladder.  A pfannanstiel incision was made with the scalpel and carried down to the fascia with the bovie cautery. The fascia was incised in the midline with the scalpel and carried in a transverse curvilinear manner bilaterally.  The fascia was reflected superiorly and inferiorly off the rectus muscles and the muscles split in the midline.  A bowel free portion of the peritoneum was entered bluntly and then extended in a superior and inferior manner with good visualization of the bowel and bladder.  The Alexis instrument was then placed and the vesico-uterine fascia tented up and incised in a transverse curvilinear manner.  A 2 cm transverse incision was made in the upper portion of the lower uterine segment until the amnion was exposed.   The incision was  extended transversely in a blunt manner.  Clear fluid was noted and the baby delivered in the vertex presentation without complication.  The baby was bulb suctioned and the cord was clamped and cut.  The baby was then handed to awaiting Neonatology.  The placenta was then delivered manually and the uterus cleared of all debris.  The uterine incision was then closed with a running lock stitch of 0 monocryl.  An imbricating layer of 0 monocryl was closed as well. Excellent hemostasis of the uterine incision was achieved and the abdomen was cleared with irrigation.  The peritoneum was closed with a running stitch of 2-0 vicryl.  This incorporated the rectus muscles as a separate layer.  The fascia was then closed with a running stitch of 0 vicryl.  The subcutaneous layer was closed with interrupted  stitches of 2-0 plain gut.  The skin was closed with 4-0 vicryl on a Keith needle and steri-strips.  The patient tolerated the procedure well and was returned to the recovery room in stable condition.  All counts were correct times three.  Tron Flythe A

## 2014-06-14 NOTE — Brief Op Note (Signed)
06/12/2014 - 06/14/2014  3:41 AM  PATIENT:  Raven [name]  24 y.o. female  PRE-OPERATIVE DIAGNOSIS:  Non reassuring fetal heart tone remote from vaginal delivery  POST-OPERATIVE DIAGNOSIS:  Primary Cesearan Section  PROCEDURE:  Procedure(s): CESAREAN SECTION (N/A)  SURGEON:  Surgeon(s) and Role:    * Bobbye Charleston, MD - Primary   ANESTHESIA:   epidural  EBL:  Total I/O In: 1700 [I.V.:1700] Out: 650 [Urine:100; Blood:550]  SPECIMEN:  No Specimen  DISPOSITION OF SPECIMEN:  N/A  COUNTS:  YES  TOURNIQUET:  * No tourniquets in log *  DICTATION: .Note written in EPIC  PLAN OF CARE: Admit to inpatient   PATIENT DISPOSITION:  PACU - hemodynamically stable.   Delay start of Pharmacological VTE agent (>24hrs) due to surgical blood loss or risk of bleeding: not applicable  Complications:  none Medications:  Ancef, Pitocin Findings:  Baby female, Apgars 8,9, weight P.   Normal tubes, ovaries and uterus seen.  Baby was skin to skin with mother after birth in the OR.  Technique:  After adequate epidural anesthesia was achieved, the patient was prepped and draped in usual sterile fashion.  A foley catheter was used to drain the bladder.  A pfannanstiel incision was made with the scalpel and carried down to the fascia with the bovie cautery. The fascia was incised in the midline with the scalpel and carried in a transverse curvilinear manner bilaterally.  The fascia was reflected superiorly and inferiorly off the rectus muscles and the muscles split in the midline.  A bowel free portion of the peritoneum was entered bluntly and then extended in a superior and inferior manner with good visualization of the bowel and bladder.  The Alexis instrument was then placed and the vesico-uterine fascia tented up and incised in a transverse curvilinear manner.  A 2 cm transverse incision was made in the upper portion of the lower uterine segment until the amnion was exposed.   The incision was  extended transversely in a blunt manner.  Clear fluid was noted and the baby delivered in the vertex presentation without complication.  The baby was bulb suctioned and the cord was clamped and cut.  The baby was then handed to awaiting Neonatology.  The placenta was then delivered manually and the uterus cleared of all debris.  The uterine incision was then closed with a running lock stitch of 0 monocryl.  An imbricating layer of 0 monocryl was closed as well. Excellent hemostasis of the uterine incision was achieved and the abdomen was cleared with irrigation.  The peritoneum was closed with a running stitch of 2-0 vicryl.  This incorporated the rectus muscles as a separate layer.  The fascia was then closed with a running stitch of 0 vicryl.  The subcutaneous layer was closed with interrupted  stitches of 2-0 plain gut.  The skin was closed with 4-0 vicryl on a Keith needle and steri-strips.  The patient tolerated the procedure well and was returned to the recovery room in stable condition.  All counts were correct times three.  Berdie Malter A

## 2014-06-14 NOTE — Addendum Note (Signed)
Addendum  created 06/14/14 1412 by Asher Muir, CRNA   Modules edited: Notes Section   Notes Section:  File: [id number]

## 2014-06-14 NOTE — Progress Notes (Signed)
POD#0 Pt sleeping, spoke with family members, they state she is doing well. Advised to call RN who will call me if any issues today.  Filed Vitals:   06/14/14 0530 06/14/14 0635 06/14/14 0730 06/14/14 0830  BP: 120/86 122/76 117/67 115/69  Pulse: 104 122 122 111  Temp: 98.7 F (37.1 C) 98.6 F (37 C) 99.5 F (37.5 C) 100 F (37.8 C)  TempSrc: Oral Oral Oral Oral  Resp: 20 18 18 20   Height:      Weight:      SpO2: 96% 96% 96% 95%    Lab Results  Component Value Date   WBC 16.8* 06/14/2014   HGB 10.9* 06/14/2014   HCT 33.3* 06/14/2014   MCV 94.6 06/14/2014   PLT 128* 06/14/2014    [name]

## 2014-06-14 NOTE — Anesthesia Postprocedure Evaluation (Signed)
  Anesthesia Post-op Note  Patient: Raven [name]  Procedure(s) Performed: Procedure(s) (LRB): CESAREAN SECTION (N/A)  Patient Location: PACU  Anesthesia Type: Epidural  Level of Consciousness: awake and alert   Airway and Oxygen Therapy: Patient Spontanous Breathing  Post-op Pain: mild  Post-op Assessment: Post-op Vital signs reviewed, Patient's Cardiovascular Status Stable, Respiratory Function Stable, Patent Airway and No signs of Nausea or vomiting  Last Vitals:  Filed Vitals:   06/14/14 0530  BP: 120/86  Pulse: 104  Temp: 37.1 C  Resp: 20    Post-op Vital Signs: stable   Complications: No apparent anesthesia complications

## 2014-06-14 NOTE — Anesthesia Postprocedure Evaluation (Signed)
Anesthesia Post Note  Patient: Raven [name]  Procedure(s) Performed: Procedure(s) (LRB): CESAREAN SECTION (N/A)  Anesthesia type: Epidural  Patient location: Mother/Baby  Post pain: Pain level controlled  Post assessment: Post-op Vital signs reviewed  Last Vitals:  Filed Vitals:   06/14/14 0830  BP: 115/69  Pulse: 111  Temp: 37.8 C  Resp: 20    Post vital signs: Reviewed  Level of consciousness:alert  Complications: No apparent anesthesia complications

## 2014-06-15 ENCOUNTER — Encounter (HOSPITAL_COMMUNITY): Payer: Self-pay | Admitting: Obstetrics and Gynecology

## 2014-06-15 LAB — BIRTH TISSUE RECOVERY COLLECTION (PLACENTA DONATION)

## 2014-06-15 NOTE — Progress Notes (Signed)
Subjective: Postpartum Day 1: Cesarean Delivery Patient reports incisional pain, tolerating PO, + flatus and no problems voiding.    Objective: Vital signs in last 24 hours: Temp:  [97.7 F (36.5 C)-99.1 F (37.3 C)] 98 F (36.7 C) (05/05 0507) Pulse Rate:  [87-115] 100 (05/05 0507) Resp:  [18-20] 20 (05/05 0507) BP: (105-129)/(52-82) 110/52 mmHg (05/05 0507) SpO2:  [97 %-100 %] 97 % (05/05 0507)  Physical Exam:  General: alert, cooperative and no distress Lochia: appropriate Uterine Fundus: firm Incision: healing well, small amount of serous drainage on dressing, no dehiscence, no significant erythema DVT Evaluation: No evidence of DVT seen on physical exam.   Recent Labs  06/14/14 0415 06/14/14 1804  HGB 10.9* 9.9*  HCT 33.3* 29.8*    Assessment/Plan: Status post Cesarean section. Doing well postoperatively.  Continue current care. Will have honeycomb dressing changed Circumcision for baby today if cleared by Peds or tomorrow  [place], [place] 06/15/2014, 9:11 AM

## 2014-06-15 NOTE — Progress Notes (Signed)
Attended 'Well After Birth' group class which presented discharge care information for both Mom and Baby. 

## 2014-06-15 NOTE — Lactation Note (Signed)
This note was copied from the chart of [place]. Lactation Consultation Note  Patient Name: Raven [name] [id number] Date: 06/15/2014 Reason for consult: Follow-up assessment;Difficult latch  RN asked for LC to observe baby at the breast.  Mom seated leaning back in chair with baby in football hold.  Little pillow support under baby, and he was almost in an upright position.  No breast support, and baby sucking on nipple shield, pushing shield tip in and out of mouth.  Moved Mom to bed, and adding more pillow support with explanation.  Rolled up cloth diaper placed under breast for support.  Manual pumped for a couple minutes, nipple pulled out, and good amount of colostrum expressed.  Attempted to latch baby without a nipple shield, but baby slipping onto nipple.  Told Mom that [facility] felt that baby would grow into being able to latch without using a nipple shield.  Initiated the 20 mm nipple shield, and helped Mom to use firm breast support and compression.  Showed FOB how to un-tuck lower lip.  Baby fed with multiple swallowing.  Mom stated that the latch was much more comfortable.  Lots of teaching done.  To follow up in am.  Mom to call prn.  Encouraged continued skin to skin, and cue based feedings. Consult Status Consult Status: Follow-up Date: 06/16/14 Follow-up type: In-patient    Broadus John 06/15/2014, 5:01 PM

## 2014-06-15 NOTE — Lactation Note (Signed)
This note was copied from the chart of [place]. Lactation Consultation Note Baby wouldn't latch d/t tongue thrusting. Mom has flat nipples, has shells, and hand pump. Has own personal DEBP. Showed them how it worked and cleaning. Mom has large breast. Has been doing lots of STS. Hand expression demonstrated and hand expressed 19ml. Fitted mom w/#20 NS Inserted colostrum into NS and encouraged the "C" hold for latching/ mom has large breast. Baby latched well w/o tongue thrusting. Heard swallows. Mom had worried baby hadn't been feeding.  Applied shells to assist nipple to evert.  Patient Name: [name] [id number] Date: 06/15/2014 Reason for consult: Follow-up assessment;Difficult latch   Maternal Data    Feeding Feeding Type: Breast Fed Length of feed: 15 min (still BF)  LATCH Score/Interventions Latch: Grasps breast easily, tongue down, lips flanged, rhythmical sucking. Intervention(s): Adjust position;Assist with latch;Breast massage;Breast compression  Audible Swallowing: A few with stimulation Intervention(s): Skin to skin;Hand expression Intervention(s): Hand expression;Alternate breast massage  Type of Nipple: Flat Intervention(s): Hand pump;Double electric pump;Shells  Comfort (Breast/Nipple): Soft / non-tender     Hold (Positioning): Assistance needed to correctly position infant at breast and maintain latch. Intervention(s): Breastfeeding basics reviewed;Support Pillows;Position options;Skin to skin  LATCH Score: 7  Lactation Tools Discussed/Used Tools: Shells;Nipple [place] Fuel;Pump Nipple shield size: 20;16 Shell Type: Inverted Breast pump type: Double-Electric Breast Pump (personal DEBP) Pump Review: Setup, frequency, and cleaning;Milk Storage Initiated by:: RN Date initiated:: 06/15/14   Consult Status Consult Status: Follow-up Date: 06/16/14 Follow-up type: In-patient    Raven [name], Elta Gu[place]oupe 06/15/2014, 6:18 AM

## 2014-06-16 MED ORDER — OXYCODONE-ACETAMINOPHEN 5-325 MG PO TABS: 2.0000 | ORAL_TABLET | ORAL | Status: DC | PRN

## 2014-06-16 NOTE — Progress Notes (Signed)
POD#2 Pt without complaints. She would like to go home today. B/Ps are good.  IMP/ Doing well Plan./ Will discharge to home.

## 2014-06-16 NOTE — Discharge Summary (Signed)
Obstetric Discharge Summary Reason for Admission: induction of labor Prenatal Procedures: NST and ultrasound Intrapartum Procedures: cesarean: low cervical, transverse Postpartum Procedures: none Complications-Operative and Postpartum: none HEMOGLOBIN  Date Value Ref Range Status  06/14/2014 9.9* 12.0 - 15.0 g/dL Final   HCT  Date Value Ref Range Status  06/14/2014 29.8* 36.0 - 46.0 % Final    Physical Exam:  General: alert Lochia: appropriate Uterine Fundus: firm Incision: healing well DVT Evaluation: No evidence of DVT seen on physical exam.  Discharge Diagnoses: Preelampsia and non reassuring FHTS  Discharge Information: Date: 06/16/2014 Activity: pelvic rest Diet: routine Medications: PNV, Ibuprofen and Percocet Condition: stable Instructions: refer to practice specific booklet Discharge to: home Follow-up Information    Follow up with HORVATH,MICHELLE A, MD In 1 month.   Specialty:  Obstetrics and Gynecology   Contact information:   [address] [place] [address] [phone]       Newborn Data: Live born female  Birth Weight: 6 lb 11.1 oz (3036 g) APGAR: 8, 9  Home with mother.  [place] E 06/16/2014, 8:23 AM

## 2014-06-16 NOTE — Lactation Note (Signed)
This note was copied from the chart of [place]. Called to Mom's room for inquiry into supplementation.  Mom very upset and tearful about baby's weight-loss, unsuccessful feedings, & worry that he isn't getting anything.  Mom's DEBP from home had been partially set up earlier in the day & we finished setting that up earlier in the night.  I had instructed Mom to try and latch the baby and then to post-pump with each feeding followed by hand expression, giving any colostrum to the baby.  Mom states she tried that earlier and that the baby wouldn't latch.  I explained LEAD, and the different options for supplementing, including finger feeding and putting formula into the nipple shield with a curved tip syringe.  She stated "I just want to give him a bottle".  I explained formula feeding amounts and guidelines and encouraged Mom to continue to try and latch the baby to the breast first, then pump and give whatever colostrum she gets back to the baby, with formula being the last option.

## 2014-06-16 NOTE — Lactation Note (Signed)
This note was copied from the chart of [facility]. Lactation Consultation Note: Mother states that infant is feeding well. She states she is using a #20 nipple shield. She is seeing small amts of colostrum in the shield. Mother is pumping every 2-3 hours with her own electric pump. Mother is supplementing infant with formula. Parents given supplemental guidelines. Advised to increase amts of formula given until mothers milk is in . Reviewed need to breastfeed infant 8-12 times in 24 hours. Advised in cue base feeding and cluster feeding. Mother informed of outpatient visit. Mother states she will phone when she gets home. Discussed treatment for severe engorgement. Mother receptive to all teaching.   Patient Name: Raven [name] SELTR'V Date: 06/16/2014     Maternal Data    Feeding Feeding Type: Formula  LATCH Score/Interventions                      Lactation Tools Discussed/Used     Consult Status      Darla Lesches 06/16/2014, 12:36 PM

## 2014-06-17 ENCOUNTER — Inpatient Hospital Stay (HOSPITAL_COMMUNITY)
Admission: AD | Admit: 2014-06-17 | Discharge: 2014-06-17 | Disposition: A | Discharge status: Home / Self Care | Payer: PRIVATE HEALTH INSURANCE | Source: Ambulatory Visit | Attending: Obstetrics and Gynecology | Admitting: Obstetrics and Gynecology

## 2014-06-17 ENCOUNTER — Encounter (HOSPITAL_COMMUNITY): Payer: Self-pay | Admitting: *Deleted

## 2014-06-17 DIAGNOSIS — D649 Anemia, unspecified: Secondary | ICD-10-CM | POA: Diagnosis not present

## 2014-06-17 DIAGNOSIS — N99821 Postprocedural hemorrhage and hematoma of a genitourinary system organ or structure following other procedure: Secondary | ICD-10-CM | POA: Diagnosis not present

## 2014-06-17 DIAGNOSIS — O9081 Anemia of the puerperium: Secondary | ICD-10-CM | POA: Insufficient documentation

## 2014-06-17 DIAGNOSIS — IMO0002 Reserved for concepts with insufficient information to code with codable children: Secondary | ICD-10-CM

## 2014-06-17 LAB — CBC
HCT: 26.6 % — ABNORMAL LOW (ref 36.0–46.0)
Hemoglobin: 8.6 g/dL — ABNORMAL LOW (ref 12.0–15.0)
MCH: 30.7 pg (ref 26.0–34.0)
MCHC: 32.3 g/dL (ref 30.0–36.0)
MCV: 95 fL (ref 78.0–100.0)
Platelets: 155 10*3/uL (ref 150–400)
RBC: 2.8 MIL/uL — ABNORMAL LOW (ref 3.87–5.11)
RDW: 13.4 % (ref 11.5–15.5)
WBC: 8.6 10*3/uL (ref 4.0–10.5)

## 2014-06-17 MED ORDER — KETOROLAC TROMETHAMINE 60 MG/2ML IM SOLN
60.0000 mg | Freq: Once | INTRAMUSCULAR | Status: AC
  Administered 2014-06-17: 60 mg via INTRAMUSCULAR
  Filled 2014-06-17: qty 2

## 2014-06-17 MED ORDER — METHYLERGONOVINE MALEATE 0.2 MG PO TABS: 0.2000 mg | ORAL_TABLET | Freq: Four times a day (QID) | ORAL | Status: DC

## 2014-06-17 MED ORDER — OXYCODONE-ACETAMINOPHEN 5-325 MG PO TABS
2.0000 | ORAL_TABLET | Freq: Once | ORAL | Status: AC
  Administered 2014-06-17: 2 via ORAL
  Filled 2014-06-17: qty 2

## 2014-06-17 MED ORDER — METHYLERGONOVINE MALEATE 0.2 MG PO TABS
0.2000 mg | ORAL_TABLET | Freq: Once | ORAL | Status: AC
  Administered 2014-06-17: 0.2 mg via ORAL
  Filled 2014-06-17: qty 1

## 2014-06-17 MED ORDER — OXYCODONE-ACETAMINOPHEN 5-325 MG PO TABS: 1.0000 | ORAL_TABLET | Freq: Once | ORAL | Status: DC

## 2014-06-17 MED ORDER — IBUPROFEN 600 MG PO TABS: 600.0000 mg | ORAL_TABLET | Freq: Once | ORAL | Status: DC

## 2014-06-17 MED ORDER — LACTATED RINGERS IV BOLUS (SEPSIS)
1000.0000 mL | Freq: Once | INTRAVENOUS | Status: AC
  Administered 2014-06-17: 1000 mL via INTRAVENOUS

## 2014-06-17 NOTE — MAU Provider Note (Signed)
History     CSN: [id number]  Arrival date and time: 06/17/14 0175   First Provider Initiated Contact with Patient 06/17/14 0304      Chief Complaint  Patient presents with  . Vaginal Bleeding   HPI   Ms Raven [name] is a 24 y.o. female G1P1000 status post primary cesarean section on 5/4 presents with vaginal bleeding that she noticed 1 hour prior to coming in. She has had very minimal bleeding since the surgery. She got up to use the bathroom and noted several blood clots in the toilet.  Since the bleeding she has felt very dizzy and light headed; laying on her back makes it worse.  She is due to take pain medication and states that her pain is getting really intense in her lower abdomen. She has been taking ibuprofen and percocet at home with relief.     OB History    Gravida Para Term Preterm AB TAB SAB Ectopic Multiple Living   1 1 1       0 0      Past Medical History  Diagnosis Date  . Medical history non-contributory     Past Surgical History  Procedure Laterality Date  . No past surgeries    . Cesarean section N/A 06/14/2014    Procedure: CESAREAN SECTION;  Surgeon: Bobbye Charleston, MD;  Location: [facility];  Service: Obstetrics;  Laterality: N/A;    History reviewed. No pertinent family history.  History  Substance Use Topics  . Smoking status: Never Smoker   . Smokeless tobacco: Never Used  . Alcohol Use: No    Allergies:  Allergies  Allergen Reactions  . Sulfamethoxazole-Trimethoprim Rash    Prescriptions prior to admission  Medication Sig Dispense Refill Last Dose  . ibuprofen (ADVIL,MOTRIN) 800 MG tablet Take 800 mg by mouth every 8 (eight) hours as needed.   06/16/2014 at Unknown time  . oxyCODONE-acetaminophen (PERCOCET/ROXICET) 5-325 MG per tablet Take 2 tablets by mouth every 4 (four) hours as needed (for pain scale greater than 7). 30 tablet 0 06/16/2014 at Unknown time  . Prenatal Vit-Fe Fumarate-FA (MULTIVITAMIN-PRENATAL) 27-0.8 MG TABS tablet  Take 1 tablet by mouth daily at 12 noon.   06/16/2014 at Unknown time  . ranitidine (ZANTAC) 150 MG tablet Take 150 mg by mouth 2 (two) times daily.   06/12/2014 at 0700   Results for orders placed or performed during the hospital encounter of 06/17/14 (from the past 48 hour(s))  CBC     Status: Abnormal   Collection Time: 06/17/14  3:55 AM  Result Value Ref Range   WBC 8.6 4.0 - 10.5 K/uL   RBC 2.80 (L) 3.87 - 5.11 MIL/uL   Hemoglobin 8.6 (L) 12.0 - 15.0 g/dL   HCT 26.6 (L) 36.0 - 46.0 %   MCV 95.0 78.0 - 100.0 fL   MCH 30.7 26.0 - 34.0 pg   MCHC 32.3 30.0 - 36.0 g/dL   RDW 13.4 11.5 - 15.5 %   Platelets 155 150 - 400 K/uL     Review of Systems  Constitutional: Positive for malaise/fatigue. Negative for fever and chills.  Gastrointestinal: Positive for abdominal pain (Lower abdominal pain; bilateral ).  Musculoskeletal: Positive for back pain (Lower back pain; bilateral ).  Neurological: Positive for dizziness and weakness.   Physical Exam   Blood pressure 124/85, pulse 129, temperature 98.4 F (36.9 C), temperature source Oral, resp. rate 20, last menstrual period 09/16/2013, unknown if currently breastfeeding.  Physical Exam  Constitutional:  She is oriented to person, place, and time. She appears well-developed and well-nourished. No distress.  HENT:  Head: Normocephalic.  Respiratory: Effort normal.  GI: Soft.  Genitourinary:  Cervix closed, small amount of dark red blood noted on exam glove. 1/4 of pad with dark red blood   Musculoskeletal: Normal range of motion.  Neurological: She is alert and oriented to person, place, and time.  Skin: She is not diaphoretic.  Psychiatric: Her mood appears anxious.    MAU Course  Procedures  None  MDM Percocet 2 tabs Toradol 60 mg   Discussed patients hgb with Dr. Ouida Sills at [phone]: Methergin 0.2 in MAU 1 liter bolus of LR   Significant pain relief from medication per the patient.   Assessment and Plan   A:  1.  Postoperative vaginal bleeding   2. Anemia, unspecified anemia type     P:  Discharge home in stable condition Continue pain medication at home RX: Methergin times 5 doses only Bleeding precautions Return to MAU as needed, if symptoms worsen  Follow up with OB as scheduled  Continue prenatal vitamins with Iron  Lezlie Lye, NP 06/19/2014 8:38 AM

## 2014-06-17 NOTE — MAU Note (Signed)
Pt had primary c section on 06/14/14 and was [facility]14.  Pt presents to MAU now with c/o heavy vaginal bleeding that started around 0200 this morning when she went to the bathroom and noticed large clots.  Pt states feeling dizzy as she walked to room 7 in MAU.

## 2014-06-25 ENCOUNTER — Encounter (HOSPITAL_COMMUNITY): Payer: Self-pay | Admitting: Emergency Medicine

## 2014-06-25 ENCOUNTER — Emergency Department (HOSPITAL_COMMUNITY)
Admission: EM | Admit: 2014-06-25 | Discharge: 2014-06-26 | Disposition: A | Discharge status: Home / Self Care | Payer: PRIVATE HEALTH INSURANCE | Attending: Emergency Medicine | Admitting: Emergency Medicine

## 2014-06-25 DIAGNOSIS — R112 Nausea with vomiting, unspecified: Secondary | ICD-10-CM | POA: Diagnosis present

## 2014-06-25 DIAGNOSIS — K2970 Gastritis, unspecified, without bleeding: Secondary | ICD-10-CM | POA: Diagnosis not present

## 2014-06-25 DIAGNOSIS — Z3202 Encounter for pregnancy test, result negative: Secondary | ICD-10-CM | POA: Insufficient documentation

## 2014-06-25 DIAGNOSIS — Z79899 Other long term (current) drug therapy: Secondary | ICD-10-CM | POA: Diagnosis not present

## 2014-06-25 LAB — CBC WITH DIFFERENTIAL/PLATELET
Basophils Absolute: 0 10*3/uL (ref 0.0–0.1)
Basophils Relative: 0 % (ref 0–1)
Eosinophils Absolute: 0.1 10*3/uL (ref 0.0–0.7)
Eosinophils Relative: 1 % (ref 0–5)
HCT: 38.5 % (ref 36.0–46.0)
Hemoglobin: 12.3 g/dL (ref 12.0–15.0)
LYMPHS ABS: 2.8 10*3/uL (ref 0.7–4.0)
Lymphocytes Relative: 25 % (ref 12–46)
MCH: 29.6 pg (ref 26.0–34.0)
MCHC: 31.9 g/dL (ref 30.0–36.0)
MCV: 92.8 fL (ref 78.0–100.0)
MONO ABS: 0.5 10*3/uL (ref 0.1–1.0)
Monocytes Relative: 5 % (ref 3–12)
Neutro Abs: 7.5 10*3/uL (ref 1.7–7.7)
Neutrophils Relative %: 69 % (ref 43–77)
Platelets: 407 10*3/uL — ABNORMAL HIGH (ref 150–400)
RBC: 4.15 MIL/uL (ref 3.87–5.11)
RDW: 13.1 % (ref 11.5–15.5)
WBC: 11 10*3/uL — ABNORMAL HIGH (ref 4.0–10.5)

## 2014-06-25 LAB — COMPREHENSIVE METABOLIC PANEL
ALK PHOS: 106 U/L (ref 38–126)
ALT: 17 U/L (ref 14–54)
ANION GAP: 12 (ref 5–15)
AST: 16 U/L (ref 15–41)
Albumin: 3.6 g/dL (ref 3.5–5.0)
BILIRUBIN TOTAL: 0.2 mg/dL — AB (ref 0.3–1.2)
BUN: 12 mg/dL (ref 6–20)
CALCIUM: 9.4 mg/dL (ref 8.9–10.3)
CO2: 26 mmol/L (ref 22–32)
CREATININE: 0.95 mg/dL (ref 0.44–1.00)
Chloride: 98 mmol/L — ABNORMAL LOW (ref 101–111)
GFR calc Af Amer: >60 mL/min (ref >60)
GFR calc non Af Amer: >60 mL/min (ref >60)
Glucose, Bld: 98 mg/dL (ref 65–99)
Potassium: 3.8 mmol/L (ref 3.5–5.1)
Sodium: 136 mmol/L (ref 135–145)
Total Protein: 7.5 g/dL (ref 6.5–8.1)

## 2014-06-25 LAB — URINALYSIS, ROUTINE W REFLEX MICROSCOPIC
Bilirubin Urine: NEGATIVE
Glucose, UA: NEGATIVE mg/dL
KETONES UR: NEGATIVE mg/dL
Nitrite: NEGATIVE
Protein, ur: 30 mg/dL — AB
SPECIFIC GRAVITY, URINE: 1.025 (ref 1.005–1.030)
Urobilinogen, UA: 0.2 mg/dL (ref 0.0–1.0)
pH: 7.5 (ref 5.0–8.0)

## 2014-06-25 LAB — URINE MICROSCOPIC-ADD ON

## 2014-06-25 LAB — POC URINE PREG, ED: Preg Test, Ur: NEGATIVE

## 2014-06-25 LAB — LIPASE, BLOOD: Lipase: 24 U/L (ref 22–51)

## 2014-06-25 MED ORDER — SODIUM CHLORIDE 0.9 % IV SOLN
1000.0000 mL | Freq: Once | INTRAVENOUS | Status: AC
  Administered 2014-06-25: 1000 mL via INTRAVENOUS

## 2014-06-25 MED ORDER — SODIUM CHLORIDE 0.9 % IV SOLN
1000.0000 mL | Freq: Once | INTRAVENOUS | Status: AC
  Administered 2014-06-26: 1000 mL via INTRAVENOUS

## 2014-06-25 MED ORDER — SODIUM CHLORIDE 0.9 % IV SOLN
1000.0000 mL | INTRAVENOUS | Status: DC
  Administered 2014-06-26: 1000 mL via INTRAVENOUS

## 2014-06-25 NOTE — ED Notes (Addendum)
Patient with abdominal pain, nausea and vomiting for the last 5 days.  She is 11 days post partum via cesarean.  The pain is on the right upper abdomen.  Patient seen at [facility] two days.

## 2014-06-25 NOTE — ED Provider Notes (Signed)
CSN: [id number]     Arrival date & time 06/25/14  1927 History  This chart was scribed for Linton Flemings, MD by Mercy Moore, ED scribe.  This patient was seen in room D30C/D30C and the patient's care was started at 11:36 PM.   Chief Complaint  Patient presents with  . Nausea  . Emesis   The history is provided by the patient. No language interpreter was used.   HPI Comments: Raven [name] is a 24 y.o. female who presents to the Emergency Department complaining of nausea and vomiting since a C section 11 days ago. Patient states she has been unable to tolerate solids or liquids without vomiting for the past 5 days. Patient reports constant stabbing pain in her right ribs with vomiting and reports radiation of her pain into her shoulder blades intermittently. Patient reports fever of 101F six days ago, none since. Patient shares that she has been taking Zofran, without relief. Patient evaluated previously at [facility]; at visit patient given injection of Rocephin for suspicion of bladder/kidney infection due to blood in her urine. Patient additionally treated with Phenergan: prescription called in by her GYN, who advised that she report to ED for evaluation.  Patient denies urinary symptoms. Patient denies family history of gall bladder disease.   Past Medical History  Diagnosis Date  . Medical history non-contributory    Past Surgical History  Procedure Laterality Date  . No past surgeries    . Cesarean section N/A 06/14/2014    Procedure: CESAREAN SECTION;  Surgeon: Bobbye Charleston, MD;  Location: [facility];  Service: Obstetrics;  Laterality: N/A;   No family history on file. History  Substance Use Topics  . Smoking status: Never Smoker   . Smokeless tobacco: Never Used  . Alcohol Use: No   OB History    Gravida Para Term Preterm AB TAB SAB Ectopic Multiple Living   1 1 1       0 0     Review of Systems  Constitutional: Negative for fever.  HENT: Negative for congestion and sore throat.    Eyes: Negative for pain.  Respiratory: Negative for cough, shortness of breath and wheezing.   Cardiovascular: Negative for chest pain.  Gastrointestinal: Positive for nausea, vomiting and abdominal pain. Negative for diarrhea.  Genitourinary: Negative for dysuria.  Musculoskeletal: Negative for neck pain.  Skin: Negative for rash.  Allergic/Immunologic: Negative for immunocompromised state.  Neurological: Negative for headaches.  Hematological: Negative for adenopathy.  Psychiatric/Behavioral: Negative for behavioral problems.      Allergies  Sulfamethoxazole-trimethoprim  Home Medications   Prior to Admission medications   Medication Sig Start Date End Date Taking? Authorizing Provider  ibuprofen (ADVIL,MOTRIN) 800 MG tablet Take 800 mg by mouth every 8 (eight) hours as needed.    Historical Provider, MD  methylergonovine (METHERGINE) 0.2 MG tablet Take 1 tablet (0.2 mg total) by mouth every 6 (six) hours. 06/17/14   Lezlie Lye, NP  oxyCODONE-acetaminophen (PERCOCET/ROXICET) 5-325 MG per tablet Take 2 tablets by mouth every 4 (four) hours as needed (for pain scale greater than 7). 06/16/14   Evely Gainey Millers, MD  Prenatal Vit-Fe Fumarate-FA (MULTIVITAMIN-PRENATAL) 27-0.8 MG TABS tablet Take 1 tablet by mouth daily at 12 noon.    Historical Provider, MD  ranitidine (ZANTAC) 150 MG tablet Take 150 mg by mouth 2 (two) times daily.    Historical Provider, MD   Triage Vitals: BP 125/87 mmHg  Pulse 111  Temp(Src) 98.4 F (36.9 C) (Oral)  Resp  18  Ht 5\' 4"  (1.626 m)  Wt 195 lb 2 oz (88.508 kg)  BMI 33.48 kg/m2  SpO2 100%  LMP 09/16/2013 Physical Exam  Constitutional: She is oriented to person, place, and time. She appears well-developed and well-nourished.  HENT:  Head: Normocephalic and atraumatic.  Nose: Nose normal.  Dry mucous membranes  Eyes: Conjunctivae and EOM are normal. Pupils are equal, round, and reactive to light.  Neck: Normal range of motion. Neck supple. No  JVD present. No tracheal deviation present. No thyromegaly present.  Cardiovascular: Normal rate, regular rhythm, normal heart sounds and intact distal pulses.  Exam reveals no gallop and no friction rub.   No murmur heard. Pulmonary/Chest: Effort normal and breath sounds normal. No stridor. No respiratory distress. She has no wheezes. She has no rales. She exhibits no tenderness.  Abdominal: Soft. Bowel sounds are normal. She exhibits no distension and no mass. There is no tenderness. There is no rebound and no guarding.  C-section incision clean, dry and intact.  Patient has moderate tenderness across lower abdomen.  No right upper quadrant tenderness.  Musculoskeletal: Normal range of motion. She exhibits no edema or tenderness.  Lymphadenopathy:    She has no cervical adenopathy.  Neurological: She is alert and oriented to person, place, and time. She displays normal reflexes. She exhibits normal muscle tone. Coordination normal.  Skin: Skin is warm and dry. No rash noted. No erythema. No pallor.  Psychiatric: She has a normal mood and affect. Her behavior is normal. Judgment and thought content normal.  Nursing note and vitals reviewed.   ED Course  Procedures (including critical care time)  COORDINATION OF CARE: 11:41 PM- Discussed treatment plan with patient at bedside and patient agreed to plan.   Labs Review Labs Reviewed  CBC WITH DIFFERENTIAL/PLATELET - Abnormal; Notable for the following:    WBC 11.0 (*)    Platelets 407 (*)    All other components within normal limits  COMPREHENSIVE METABOLIC PANEL - Abnormal; Notable for the following:    Chloride 98 (*)    Total Bilirubin 0.2 (*)    All other components within normal limits  URINALYSIS, ROUTINE W REFLEX MICROSCOPIC - Abnormal; Notable for the following:    APPearance CLOUDY (*)    Hgb urine dipstick LARGE (*)    Protein, ur 30 (*)    Leukocytes, UA MODERATE (*)    All other components within normal limits  URINE  MICROSCOPIC-ADD ON - Abnormal; Notable for the following:    Squamous Epithelial / LPF FEW (*)    Bacteria, UA FEW (*)    All other components within normal limits  URINE CULTURE  LIPASE, BLOOD  POC URINE PREG, ED    Imaging Review US Abdomen Limited  06/26/2014   CLINICAL DATA:  Nausea, vomiting, RIGHT upper quadrant pain for 11 days. Status post Caesarean section 11 days ago.  EXAM: US ABDOMEN LIMITED - RIGHT UPPER QUADRANT  COMPARISON:  None.  FINDINGS: Gallbladder:  No gallstones or wall thickening visualized. No sonographic Murphy sign noted.  Common bile duct:  Diameter: 4.3 mm  Liver:  No focal lesion identified. Within normal limits in parenchymal echogenicity. Hepatopetal portal vein.  IMPRESSION: Normal RIGHT upper quadrant ultrasound.   Electronically Signed   By: Elon Alas   On: 06/26/2014 02:39     EKG Interpretation None      MDM   Final diagnoses:  Nausea and vomiting, vomiting of unspecified type  Gastritis   24 year old female  11 days postpartum from C-section who presents with nausea and vomiting with eating, right upper quadrant pain.  Concern for cholelithiasis versus cholecystitis.  Patient appears clinically dehydrated.  Urine is contaminated.  Given that she is currently having lochia.  Will get in andout for culture.  Patient was seen by urgent care and given a shot of Rocephin no antibiotics since that time.  No fevers or chills.  Plan for right upper quadrant ultrasound for further evaluation.   I personally performed the services described in this documentation, which was scribed in my presence. The recorded information has been reviewed and is accurate.   U/s without gallbladder pathology.  Pt has tolerated PO here.  Will start on protonix, carafate.   Linton Flemings, MD 06/26/14 [phone]

## 2014-06-26 ENCOUNTER — Emergency Department (HOSPITAL_COMMUNITY): Payer: PRIVATE HEALTH INSURANCE

## 2014-06-26 LAB — URINE CULTURE
COLONY COUNT: NO GROWTH
CULTURE: NO GROWTH

## 2014-06-26 MED ORDER — ONDANSETRON 8 MG PO TBDP: 8.0000 mg | ORAL_TABLET | Freq: Three times a day (TID) | ORAL | Status: DC | PRN

## 2014-06-26 MED ORDER — PANTOPRAZOLE SODIUM 40 MG IV SOLR
40.0000 mg | Freq: Once | INTRAVENOUS | Status: AC
  Administered 2014-06-26: 40 mg via INTRAVENOUS
  Filled 2014-06-26: qty 40

## 2014-06-26 MED ORDER — SUCRALFATE 1 G PO TABS: 1.0000 g | ORAL_TABLET | Freq: Four times a day (QID) | ORAL | Status: DC

## 2014-06-26 MED ORDER — PANTOPRAZOLE SODIUM 20 MG PO TBEC: 40.0000 mg | DELAYED_RELEASE_TABLET | Freq: Every day | ORAL | Status: DC

## 2014-06-26 NOTE — ED Notes (Signed)
Protonix given as ordered, food given, pt feeling very nauseated at this time, MD to be notified.

## 2014-06-26 NOTE — ED Notes (Signed)
Patient transported to Ultrasound 

## 2014-06-26 NOTE — Discharge Instructions (Signed)
Take medications as prescribed.  Stick to a bland diet until feeling better.  Eat small meals until you are feeling better.  Plain pasta, chicken, no spicy or acidic foods.   Gastritis, Adult Gastritis is soreness and swelling (inflammation) of the lining of the stomach. Gastritis can develop as a sudden onset (acute) or long-term (chronic) condition. If gastritis is not treated, it can lead to stomach bleeding and ulcers. CAUSES  Gastritis occurs when the stomach lining is weak or damaged. Digestive juices from the stomach then inflame the weakened stomach lining. The stomach lining may be weak or damaged due to viral or bacterial infections. One common bacterial infection is the Helicobacter pylori infection. Gastritis can also result from excessive alcohol consumption, taking certain medicines, or having too much acid in the stomach.  SYMPTOMS  In some cases, there are no symptoms. When symptoms are present, they may include:  Pain or a burning sensation in the upper abdomen.  Nausea.  Vomiting.  An uncomfortable feeling of fullness after eating. DIAGNOSIS  Your caregiver may suspect you have gastritis based on your symptoms and a physical exam. To determine the cause of your gastritis, your caregiver may perform the following:  Blood or stool tests to check for the H pylori bacterium.  Gastroscopy. A thin, flexible tube (endoscope) is passed down the esophagus and into the stomach. The endoscope has a light and camera on the end. Your caregiver uses the endoscope to view the inside of the stomach.  Taking a tissue sample (biopsy) from the stomach to examine under a microscope. TREATMENT  Depending on the cause of your gastritis, medicines may be prescribed. If you have a bacterial infection, such as an H pylori infection, antibiotics may be given. If your gastritis is caused by too much acid in the stomach, H2 blockers or antacids may be given. Your caregiver may recommend that you stop  taking aspirin, ibuprofen, or other nonsteroidal anti-inflammatory drugs (NSAIDs). HOME CARE INSTRUCTIONS  Only take over-the-counter or prescription medicines as directed by your caregiver.  If you were given antibiotic medicines, take them as directed. Finish them even if you start to feel better.  Drink enough fluids to keep your urine clear or pale yellow.  Avoid foods and drinks that make your symptoms worse, such as:  Caffeine or alcoholic drinks.  Chocolate.  Peppermint or mint flavorings.  Garlic and onions.  Spicy foods.  Citrus fruits, such as oranges, lemons, or limes.  Tomato-based foods such as sauce, chili, salsa, and pizza.  Fried and fatty foods.  Eat small, frequent meals instead of large meals. SEEK IMMEDIATE MEDICAL CARE IF:   You have black or dark red stools.  You vomit blood or material that looks like coffee grounds.  You are unable to keep fluids down.  Your abdominal pain gets worse.  You have a fever.  You do not feel better after 1 week.  You have any other questions or concerns. MAKE SURE YOU:  Understand these instructions.  Will watch your condition.  Will get help right away if you are not doing well or get worse. Document Released: 01/21/2001 Document Revised: 07/29/2011 Document Reviewed: 03/12/2011 [facility] Patient Information 2015 [place], [place]. This information is not intended to replace advice given to you by your health care provider. Make sure you discuss any questions you have with your health care provider.  Nausea and Vomiting Nausea is a sick feeling that often comes before throwing up (vomiting). Vomiting is a reflex where stomach  contents come out of your mouth. Vomiting can cause severe loss of body fluids (dehydration). Children and elderly adults can become dehydrated quickly, especially if they also have diarrhea. Nausea and vomiting are symptoms of a condition or disease. It is important to find the cause of  your symptoms. CAUSES   Direct irritation of the stomach lining. This irritation can result from increased acid production (gastroesophageal reflux disease), infection, food poisoning, taking certain medicines (such as nonsteroidal anti-inflammatory drugs), alcohol use, or tobacco use.  Signals from the brain.These signals could be caused by a headache, heat exposure, an inner ear disturbance, increased pressure in the brain from injury, infection, a tumor, or a concussion, pain, emotional stimulus, or metabolic problems.  An obstruction in the gastrointestinal tract (bowel obstruction).  Illnesses such as diabetes, hepatitis, gallbladder problems, appendicitis, kidney problems, cancer, sepsis, atypical symptoms of a heart attack, or eating disorders.  Medical treatments such as chemotherapy and radiation.  Receiving medicine that makes you sleep (general anesthetic) during surgery. DIAGNOSIS Your caregiver may ask for tests to be done if the problems do not improve after a few days. Tests may also be done if symptoms are severe or if the reason for the nausea and vomiting is not clear. Tests may include:  Urine tests.  Blood tests.  Stool tests.  Cultures (to look for evidence of infection).  X-rays or other imaging studies. Test results can help your caregiver make decisions about treatment or the need for additional tests. TREATMENT You need to stay well hydrated. Drink frequently but in small amounts.You may wish to drink water, sports drinks, clear broth, or eat frozen ice pops or gelatin dessert to help stay hydrated.When you eat, eating slowly may help prevent nausea.There are also some antinausea medicines that may help prevent nausea. HOME CARE INSTRUCTIONS   Take all medicine as directed by your caregiver.  If you do not have an appetite, do not force yourself to eat. However, you must continue to drink fluids.  If you have an appetite, eat a normal diet unless your  caregiver tells you differently.  Eat a variety of complex carbohydrates (rice, wheat, potatoes, bread), lean meats, yogurt, fruits, and vegetables.  Avoid high-fat foods because they are more difficult to digest.  Drink enough water and fluids to keep your urine clear or pale yellow.  If you are dehydrated, ask your caregiver for specific rehydration instructions. Signs of dehydration may include:  Severe thirst.  Dry lips and mouth.  Dizziness.  Dark urine.  Decreasing urine frequency and amount.  Confusion.  Rapid breathing or pulse. SEEK IMMEDIATE MEDICAL CARE IF:   You have blood or brown flecks (like coffee grounds) in your vomit.  You have black or bloody stools.  You have a severe headache or stiff neck.  You are confused.  You have severe abdominal pain.  You have chest pain or trouble breathing.  You do not urinate at least once every 8 hours.  You develop cold or clammy skin.  You continue to vomit for longer than 24 to 48 hours.  You have a fever. MAKE SURE YOU:   Understand these instructions.  Will watch your condition.  Will get help right away if you are not doing well or get worse. Document Released: 01/27/2005 Document Revised: 04/21/2011 Document Reviewed: 06/26/2010 [facility] Patient Information 2015 [place], [place]. This information is not intended to replace advice given to you by your health care provider. Make sure you discuss any questions you have with  your health care provider.

## 2015-08-05 DIAGNOSIS — M25572 Pain in left ankle and joints of left foot: Secondary | ICD-10-CM | POA: Diagnosis not present

## 2015-08-15 DIAGNOSIS — M25572 Pain in left ankle and joints of left foot: Secondary | ICD-10-CM | POA: Diagnosis not present

## 2015-09-14 DIAGNOSIS — M25572 Pain in left ankle and joints of left foot: Secondary | ICD-10-CM | POA: Diagnosis not present

## 2015-12-05 DIAGNOSIS — M25572 Pain in left ankle and joints of left foot: Secondary | ICD-10-CM | POA: Diagnosis not present

## 2016-01-21 ENCOUNTER — Encounter (HOSPITAL_COMMUNITY): Payer: Self-pay | Admitting: Emergency Medicine

## 2016-01-21 ENCOUNTER — Ambulatory Visit (HOSPITAL_COMMUNITY)
Admission: EM | Admit: 2016-01-21 | Discharge: 2016-01-21 | Disposition: A | Discharge status: Home / Self Care | Payer: 59 | Attending: Family Medicine | Admitting: Family Medicine

## 2016-01-21 DIAGNOSIS — J040 Acute laryngitis: Secondary | ICD-10-CM | POA: Diagnosis not present

## 2016-01-21 MED ORDER — CHLORHEXIDINE GLUCONATE 0.12 % MT SOLN: 15.0000 mL | Freq: Two times a day (BID) | OROMUCOSAL | 0 refills | Status: DC

## 2016-01-21 NOTE — ED Provider Notes (Signed)
CSN: [id number]     Arrival date & time 01/21/16  1226 History   First MD Initiated Contact with Patient 01/21/16 1411     Chief Complaint  Patient presents with  . URI   (Consider location/radiation/quality/duration/timing/severity/associated sxs/prior Treatment) HPI  [name] is a 25 yo female with no significant PMH who presents with los of voice and general fatigue. Reports she had a cough about 2 weeks ago which resolved. But is still having some nasal congestion with yellow/green nasal discharge. About three days ago, she started to lose her voice and reports of feeling very tired. She also has some chest congestion and "had not had a good cough to cough up sputum"; sometime she is able to cough up some material, but no blood in her sputum. She has been using Muccinex DM but more recently Muccinex every 4 hours which has not been helping. No fevers, persistent cough, or shortness of breath, or sinus pressure/pain. Sometimes she gets a "tickle in her throat".  Eating and drinking well. She has an infant who is currently ill with bronchiolitis in the hospital. No other sick contacts.   Past Medical History:  Diagnosis Date  . Medical history non-contributory    Past Surgical History:  Procedure Laterality Date  . CESAREAN SECTION N/A 06/14/2014   Procedure: CESAREAN SECTION;  Surgeon: Bobbye Charleston, MD;  Location: [facility];  Service: Obstetrics;  Laterality: N/A;  . NO PAST SURGERIES     No family history on file. Social History  Substance Use Topics  . Smoking status: Never Smoker  . Smokeless tobacco: Never Used  . Alcohol use No   OB History    Gravida Para Term Preterm AB Living   1 1 1      0   SAB TAB Ectopic Multiple Live Births         0       Review of Systems: as noted above  Allergies  Sulfamethoxazole-trimethoprim  Home Medications   Prior to Admission medications   Medication Sig Start Date End Date Taking? Authorizing Provider  chlorhexidine (PERIDEX)  0.12 % solution Use as directed 15 mLs in the mouth or throat 2 (two) times daily. 01/21/16   Smiley Houseman, MD  pantoprazole (PROTONIX) 20 MG tablet Take 2 tablets (40 mg total) by mouth daily. 06/26/14   Linton Flemings, MD  Prenatal Vit-Fe Fumarate-FA (MULTIVITAMIN-PRENATAL) 27-0.8 MG TABS tablet Take 1 tablet by mouth daily at 12 noon.    Historical Provider, MD  sucralfate (CARAFATE) 1 G tablet Take 1 tablet (1 g total) by mouth 4 (four) times daily. 06/26/14   Linton Flemings, MD   Meds Ordered and Administered this Visit  Medications - No data to display  BP 130/93 (BP Location: Left Arm)   Pulse 105   Temp 99 F (37.2 C) (Oral)   SpO2 99%  No data found.   Physical Exam  Constitutional: She is oriented to person, place, and time. She appears well-developed and well-nourished. No distress.  HENT:  Nose: Nose normal.  Mouth/Throat: Oropharynx is clear and moist. No oropharyngeal exudate.  TMs normal bilaterally   Eyes: Conjunctivae are normal.  Neck: Normal range of motion. Neck supple.  Cardiovascular: Normal rate, regular rhythm and normal heart sounds.  Exam reveals no gallop and no friction rub.   No murmur heard. Pulmonary/Chest: Effort normal and breath sounds normal. No respiratory distress. She has no wheezes. She has no rales.  Lymphadenopathy:    She has no  cervical adenopathy.  Neurological: She is alert and oriented to person, place, and time.  Skin: Skin is warm and dry. Capillary refill takes less than 2 seconds. She is not diaphoretic.  Psychiatric: She has a normal mood and affect. Her behavior is normal.    Urgent Care Course   Clinical Course    Symptoms likely consistent with laryngitis which is likely viral in nature. Vitals are stable and no symptoms/signs of pneumonia on physical exam. Will prescribe Peridex BID x 5 days.   Procedures   Labs Review Labs Reviewed - No data to display  Imaging Review No results found.   MDM   1. Laryngitis    Likely viral in etiology. Vitals are stable and no symptoms/signs of pneumonia on physical exam. Will prescribe Peridex BID x 5 days. Return precautions discussed.   Presentation, exam, assessment and plan discussed with Dr. Joseph Art.   Smiley Houseman, MD PGY 2 Family Medicine    Smiley Houseman, MD 01/21/16 [phone]

## 2016-01-21 NOTE — Discharge Instructions (Signed)
Your symptoms are likely consistent with laryngitis which is most commonly caused by a viral infection. You can try Peridex (gargle and spit out twice a day for 5 days) which will help with loosening mucus. If you have difficulty breathing, please seek immediate medical attention.

## 2016-01-21 NOTE — ED Triage Notes (Signed)
Patient has had a cough for 2 weeks.  Non-productive cough.  Patient has lost voice for 2 days.  Denies fever.   Feeling bad in general .    Patient has a child admitted for bronchiolitis.

## 2016-03-15 IMAGING — US US ABDOMEN LIMITED
1 series · 14 of 25 positions shown · non-contrast
Comparison: None.

CLINICAL DATA: Nausea, vomiting, RIGHT upper quadrant pain for 11
days. Status post Caesarean section 11 days ago.

EXAM:
US ABDOMEN LIMITED - RIGHT UPPER QUADRANT

[Series 1: us abdomen limited · 0.27mm/px · 14 of 38 slices shown]
[im 1/38]
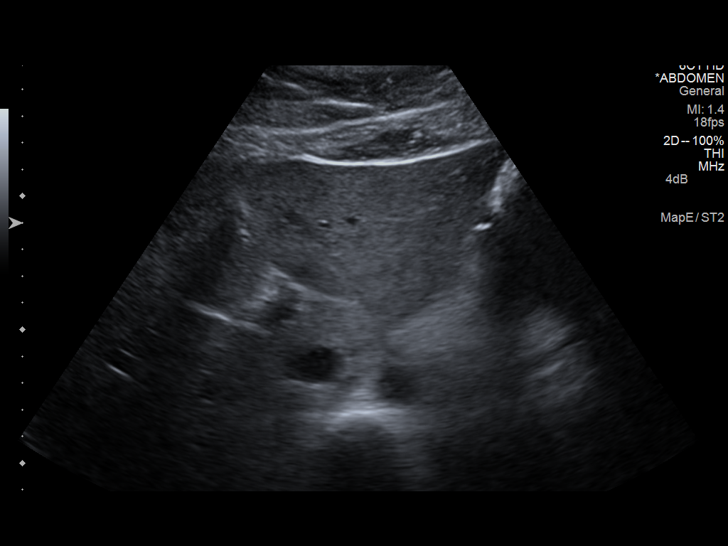
[im 4/38]
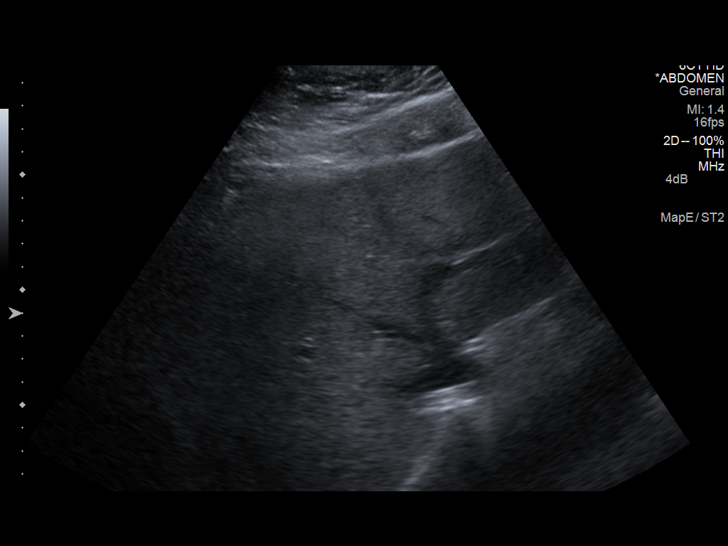
[im 7/38]
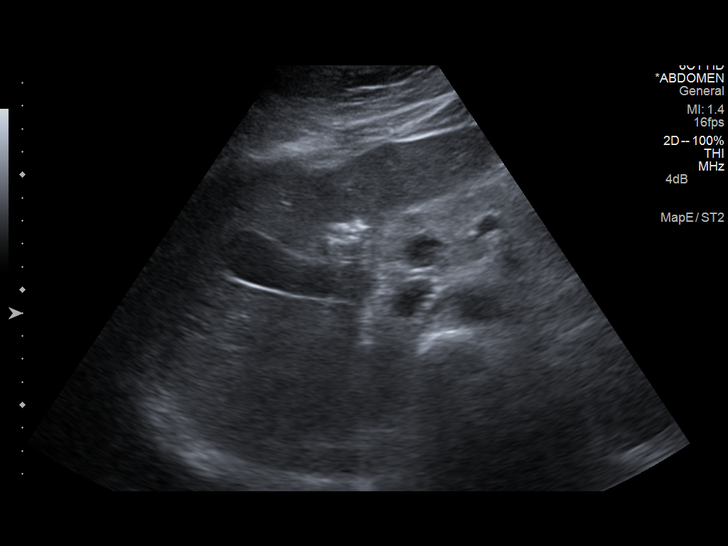
[im 10/38]
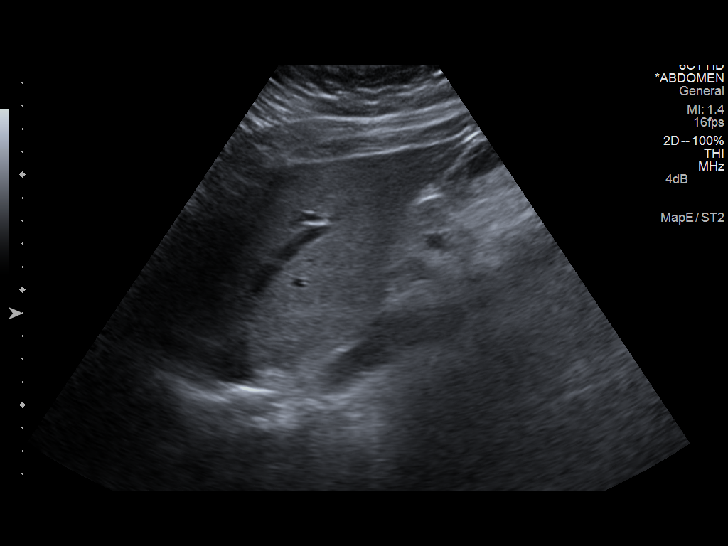
[im 13/38]
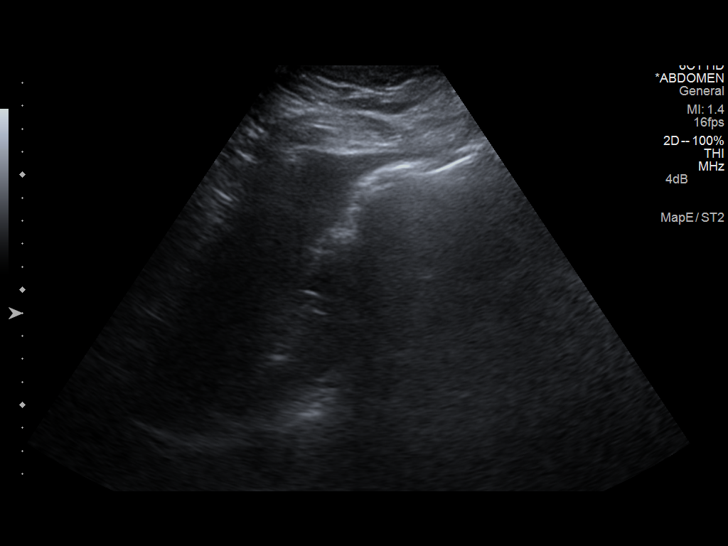
[im 14/38]
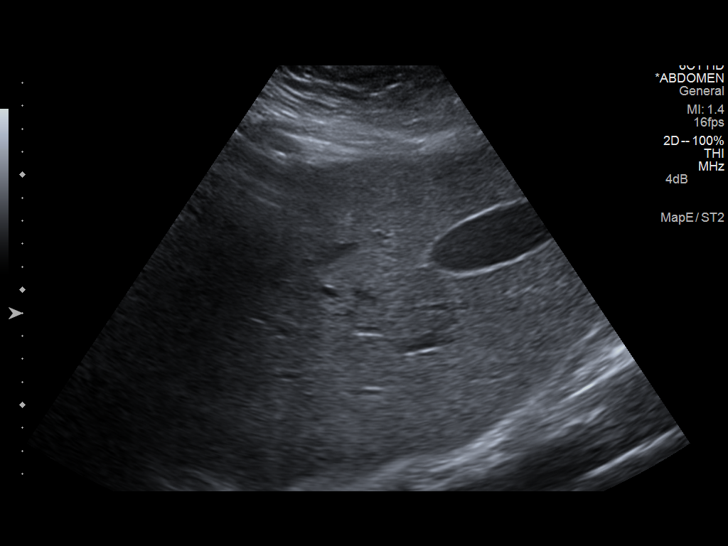
[im 17/38]
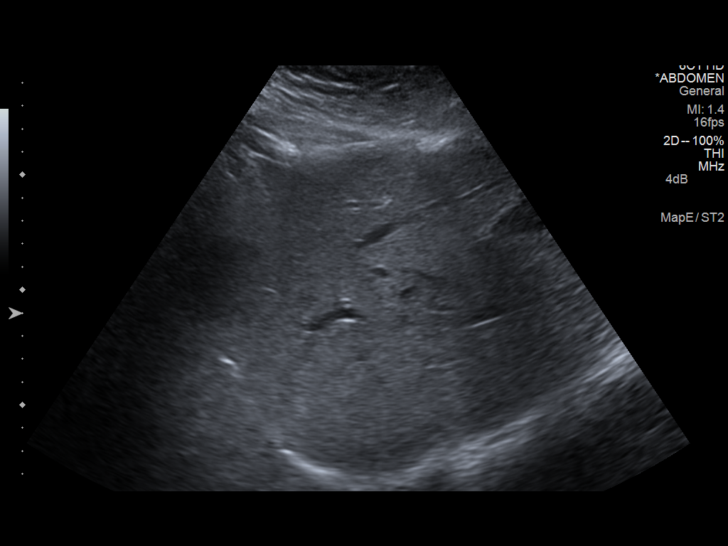
[im 21/38]
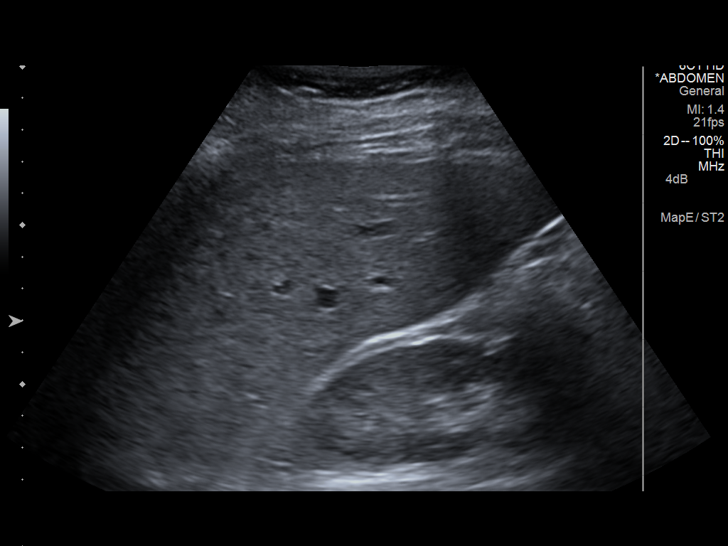
[im 24/38]
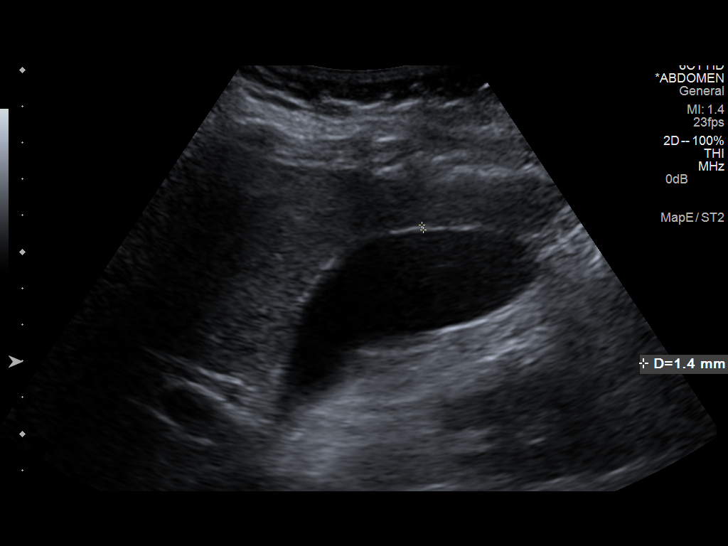
[im 25/38]
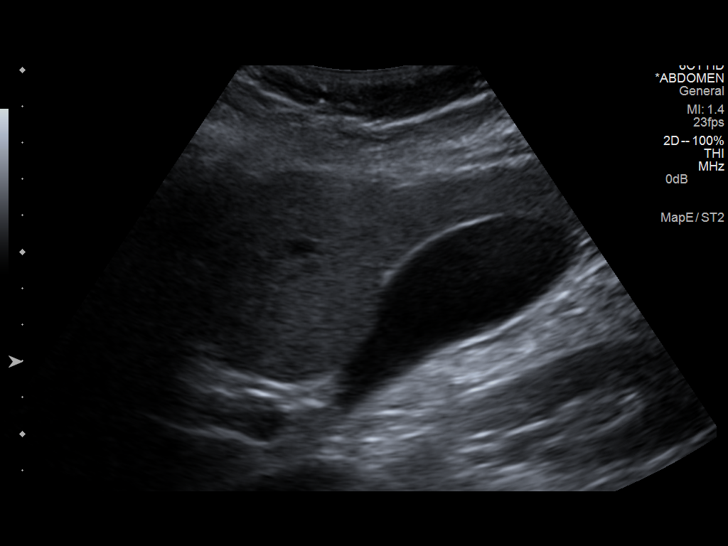
[im 28/38]
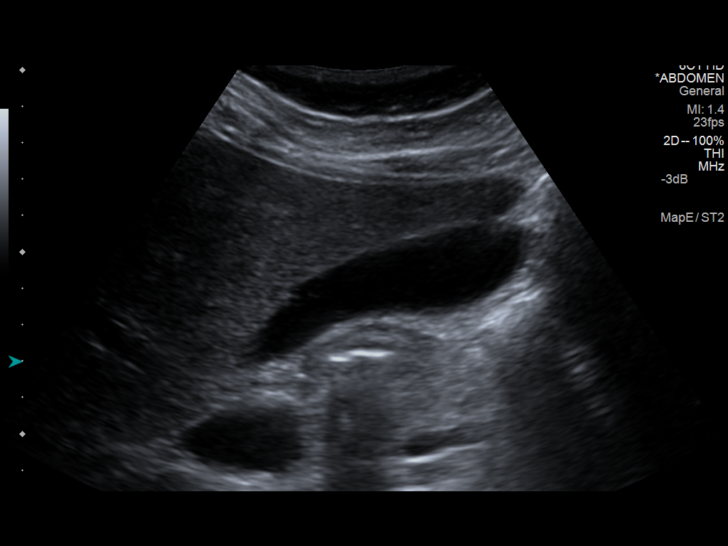
[im 31/38]
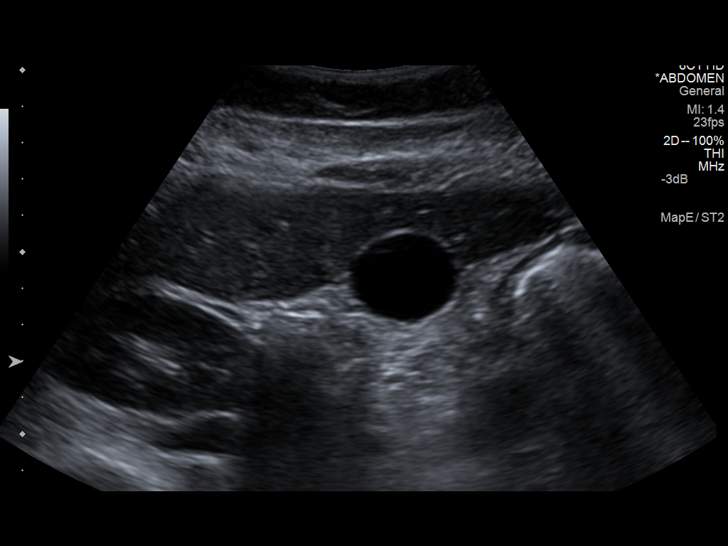
[im 34/38]
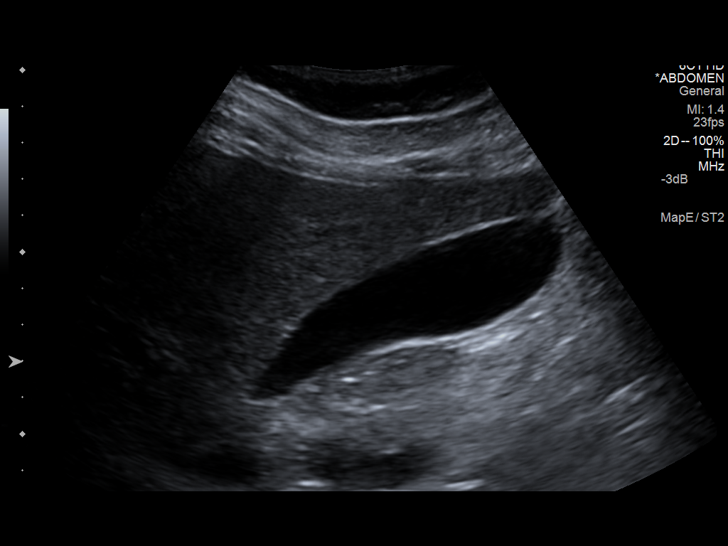
[im 38/38]
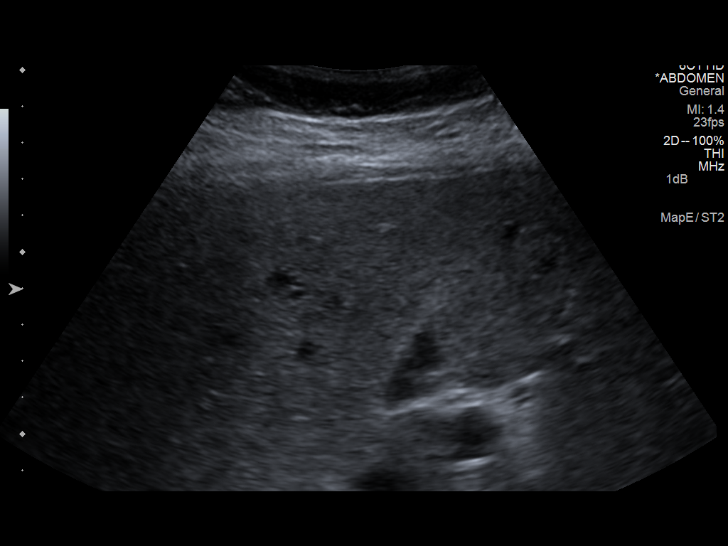

[14 of 25 positions shown; findings below may reference images not displayed]

FINDINGS: Gallbladder:

No gallstones or wall thickening visualized. No sonographic Murphy
sign noted.

Common bile duct:

Diameter: 4.3 mm

Liver:

No focal lesion identified. Within normal limits in parenchymal
echogenicity. Hepatopetal portal vein.
IMPRESSION: Normal RIGHT upper quadrant ultrasound.

By: Maelane Hichem

## 2016-05-08 ENCOUNTER — Ambulatory Visit (INDEPENDENT_AMBULATORY_CARE_PROVIDER_SITE_OTHER): Payer: 59

## 2016-05-08 ENCOUNTER — Encounter: Payer: Self-pay | Admitting: Podiatry

## 2016-05-08 ENCOUNTER — Ambulatory Visit (INDEPENDENT_AMBULATORY_CARE_PROVIDER_SITE_OTHER): Payer: 59 | Admitting: Podiatry

## 2016-05-08 DIAGNOSIS — M779 Enthesopathy, unspecified: Secondary | ICD-10-CM

## 2016-05-08 DIAGNOSIS — D3613 Benign neoplasm of peripheral nerves and autonomic nervous system of lower limb, including hip: Secondary | ICD-10-CM | POA: Diagnosis not present

## 2016-05-08 NOTE — Progress Notes (Signed)
   Subjective:    Patient ID: Raven [name], female    DOB: 05-Oct-1990, 26 y.o.   MRN: [id number]  HPI: She presents today for chief complaint of forefoot left she's is been her for about a year name injuries sometimes is sensitive to touch dorsally and along the anterior ankle she states that it radiates from her toes up to her leg. She is tracked she was treated with a cortisone injection in the surgical type shoe she's tried Voltaren gel to axis she states that it feels like he continues to get worse. She really makes it worse.    Review of Systems  Musculoskeletal: Positive for arthralgias and gait problem.  All other systems reviewed and are negative.      Objective:   Physical Exam: Vital signs are stable she is alert and oriented 3. Pulses are palpable. Neurologic sensorium is intact. Deep tendon reflexes are intact. Muscle strength is 5 over 5 dorsiflexion plantar flexors and inverters everters MUSCULATURES intact. Orthopedic evaluation and strength all joints distal to the ankle range of motion without crepitation. She has a palpable Mulder's click to the third interdigital space of the left foot with pain and range of motion of the second metatarsophalangeal joint. Radiographs taken today demonstrate elongated metatarsal otherwise no major osseous abnormalities or fractures no malalignments.          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  Plan: At this point I feel that we should start with dehydrated alcohol to the third interdigital space of the left foot. This is performed today she tolerated the procedure well and I will follow-up with her in 3 weeks for her second dose.

## 2016-06-03 ENCOUNTER — Ambulatory Visit: Payer: 59 | Admitting: Podiatry

## 2016-06-05 ENCOUNTER — Encounter (INDEPENDENT_AMBULATORY_CARE_PROVIDER_SITE_OTHER): Payer: 59 | Admitting: Podiatry

## 2016-06-05 NOTE — Progress Notes (Signed)
This encounter was created in error - please disregard.

## 2016-07-08 ENCOUNTER — Ambulatory Visit: Payer: 59 | Admitting: Podiatry

## 2016-07-18 ENCOUNTER — Telehealth: Payer: 59 | Admitting: Nurse Practitioner

## 2016-07-18 DIAGNOSIS — S20469A Insect bite (nonvenomous) of unspecified back wall of thorax, initial encounter: Secondary | ICD-10-CM

## 2016-07-18 DIAGNOSIS — W57XXXA Bitten or stung by nonvenomous insect and other nonvenomous arthropods, initial encounter: Secondary | ICD-10-CM

## 2016-07-18 MED ORDER — DOXYCYCLINE HYCLATE 100 MG PO TABS: 100.0000 mg | ORAL_TABLET | Freq: Two times a day (BID) | ORAL | 0 refills | Status: DC

## 2016-07-18 NOTE — Progress Notes (Signed)
Thank you for describing your tick bite, Here is how we plan to help! Based on the information that you shared with me it looks like you have An infected or complicated tick bite that requires a longer course of antibiotics and will need for you to schedule a follow-up visit with a provider.  In most cases a tick bite is painless and does not itch.  Most tick bites in which the tick is quickly removed do not require prescriptions. Ticks can transmit several diseases if they are infected and remain attacked to your skin. Therefore the length that the tick was attached and any symptoms you have experienced after the bite are import to accurately develop your custom treatment plan. In most cases a single dose of doxycycline may prevent the development of a more serious condition.  Based on your information I have Provided a home care guide for tick bites  and  instructions on when to call for help. and Your symptoms indicate that you need a longer course of antibiotics and a follow up visit with a provider. I have sent doxycycline 100 mg twice a day for 21 days to the pharmacy that you selected. You will need to schedule a follow up visit with your provider. If you do not have a primary care provider you may use our telehealth physicians on the web at [facility].  Which ticks  are associated with illness?  The Wood Tick (dog tick) is the size of a watermelon seed and can sometimes transmit [facility] spotted fever and [place] tick fever.   The Deer Tick (black-legged tick) is between the size of a poppy seed (pin head) and an apple seed, and can sometimes transmit Lyme disease.  A brown to black tick with a white splotch on its back is likely a female Amblyomma americanum ([place] tick). This tick has been associated with [place] Tick Associated illness ( STARI)  Lyme disease has become the most common tick-borne illness in the [place]. The risk of Lyme disease following a  recognized deer tick bite is estimated to be 1%.  The majority of cases of Lyme disease start with a bull's eye rash at the site of the tick bite. The rash can occur days to weeks (typically 7-10 days) after a tick bite. Treatment with antibiotics is indicated if this rash appears. Flu-like symptoms may accompany the rash, including: fever, chills, headaches, muscle aches, and fatigue. Removing ticks promptly may prevent tick borne disease.  What can be used to prevent Tick Bites?   Insect repellant with at leas 20% DEET.  Wearing long pants with sock and shoes.  Avoiding tall grass and heavily wooded areas.  Checking your skin after being outdoors.  Shower with a washcloth after outdoor exposures.  HOME CARE ADVICE FOR TICK BITE  1. Wood Tick Removal:  o Use a pair of tweezers and grasp the wood tick close to the skin (on its head). Pull the wood tick straight upward without twisting or crushing it. Maintain a steady pressure until it releases its grip.   o If tweezers aren't available, use fingers, a loop of thread around the jaws, or a needle between the jaws for traction.  o Note: covering the tick with petroleum jelly, nail polish or rubbing alcohol doesn't work. Neither does touching the tick with a hot or cold object. 2. Tiny Deer Tick Removal:   o Needs to be scraped off with a knife blade or credit card edge. o Place  tick in a sealed container (e.g. glass jar, zip lock plastic bag), in case your doctor wants to see it. 3. Tick's Head Removal:  o If the wood tick's head breaks off in the skin, it must be removed. Clean the skin. Then use a sterile needle to uncover the head and lift it out or scrape it off.  o If a very small piece of the head remains, the skin will eventually slough it off. 4. Antibiotic Ointment:  o Wash the wound and your hands with soap and water after removal to prevent catching any tick disease.  Apply an over the counter antibiotic ointment (e.g.  bacitracin) to the bite once. 5. Expected Course: Tick bites normally don't itch or hurt. That's why they often go unnoticed. 6. Call Your Doctor If:  o You can't remove the tick or the tick's head o Fever, a severe head ache, or rash occur in the next 2 weeks o Bite begins to look infected o Lyme's disease is common in your area o You have not had a tetanus in the last 10 years o Your current symptoms become worse    MAKE SURE YOU   Understand these instructions.  Will watch your condition.  Will get help right away if you are not doing well or get worse.   Thank you for choosing an e-visit.  Your e-visit answers were reviewed by a board certified advanced clinical practitioner to complete your personal care plan. Depending upon the condition, your plan could have included both over the counter or prescription medications. Please review your pharmacy choice. If there is a problem you may use MyChart messaging to have the prescription routed to another pharmacy. Your safety is important to us. If you have drug allergies check your prescription carefully.   You can use MyChart to ask questions about today's visit, request a non-urgent call back, or ask for a work or school excuse for 24 hours related to this e-Visit. If it has been greater than 24 hours you will need to follow up with your provider, or enter a new e-Visit to address those concerns.  You will get an email in the next two days asking about your experience. I hope  that your e-visit has been valuable and will speed your recovery  

## 2016-08-04 ENCOUNTER — Encounter: Payer: Self-pay | Admitting: Physician Assistant

## 2016-08-04 ENCOUNTER — Ambulatory Visit (INDEPENDENT_AMBULATORY_CARE_PROVIDER_SITE_OTHER): Payer: 59 | Admitting: Physician Assistant

## 2016-08-04 VITALS — BP 113/76 | HR 99 | Temp 98.0°F | Ht 64.0 in | Wt 228.0 lb

## 2016-08-04 DIAGNOSIS — L03312 Cellulitis of back [any part except buttock]: Secondary | ICD-10-CM | POA: Diagnosis not present

## 2016-08-04 NOTE — Patient Instructions (Signed)

## 2016-08-05 NOTE — Progress Notes (Signed)
BP 113/76   Pulse 99   Temp 98 F (36.7 C) (Oral)   Ht 5\' 4"  (1.626 m)   Wt 228 lb (103.4 kg)   BMI 39.14 kg/m    Subjective:    Patient ID: Raven [name], female    DOB: Jun 08, 1990, 26 y.o.   MRN: [id number]  HPI: EVADNA DONAGHY is a 26 y.o. female presenting on 08/04/2016 for [place] patient to be established. She was treated through online provider through [facility]. She is generally very healthy and just needs to be established with a health care provider. She is finishing a course of antibiotic for recent tick bite and cellulitis.  Relevant past medical, surgical, family and social history reviewed and updated as indicated. Allergies and medications reviewed and updated.  Past Medical History:  Diagnosis Date  . Medical history non-contributory     Past Surgical History:  Procedure Laterality Date  . CESAREAN SECTION N/A 06/14/2014   Procedure: CESAREAN SECTION;  Surgeon: Bobbye Charleston, MD;  Location: [facility];  Service: Obstetrics;  Laterality: N/A;  . NO PAST SURGERIES      Review of Systems  Constitutional: Negative.  Negative for activity change, fatigue and fever.  HENT: Negative.   Eyes: Negative.   Respiratory: Negative.  Negative for cough.   Cardiovascular: Negative.  Negative for chest pain.  Gastrointestinal: Negative.  Negative for abdominal pain.  Endocrine: Negative.   Genitourinary: Negative.  Negative for dysuria.  Musculoskeletal: Negative.   Skin: Negative.   Neurological: Negative.     Allergies as of 08/04/2016      Reactions   Sulfamethoxazole-trimethoprim Rash      Medication List       Accurate as of 08/04/16 11:59 PM. Always use your most recent med list.          doxycycline 100 MG tablet Commonly known as:  VIBRA-TABS Take 1 tablet (100 mg total) by mouth 2 (two) times daily. 1 po bid          Objective:    BP 113/76   Pulse 99   Temp 98 F (36.7 C) (Oral)   Ht 5\' 4"  (1.626 m)   Wt 228 lb  (103.4 kg)   BMI 39.14 kg/m   Allergies  Allergen Reactions  . Sulfamethoxazole-Trimethoprim Rash    Physical Exam  Constitutional: She is oriented to person, place, and time. She appears well-developed and well-nourished.  HENT:  Head: Normocephalic and atraumatic.  Right Ear: Tympanic membrane, external ear and ear canal normal.  Left Ear: Tympanic membrane, external ear and ear canal normal.  Nose: Nose normal. No rhinorrhea.  Mouth/Throat: Oropharynx is clear and moist and mucous membranes are normal. No oropharyngeal exudate or posterior oropharyngeal erythema.  Eyes: Conjunctivae and EOM are normal. Pupils are equal, round, and reactive to light.  Neck: Normal range of motion. Neck supple.  Cardiovascular: Normal rate, regular rhythm, normal heart sounds and intact distal pulses.   Pulmonary/Chest: Effort normal and breath sounds normal.  Abdominal: Soft. Bowel sounds are normal.  Neurological: She is alert and oriented to person, place, and time. She has normal reflexes.  Skin: Skin is warm and dry. No rash noted.  Psychiatric: She has a normal mood and affect. Her behavior is normal. Judgment and thought content normal.  Nursing note and vitals reviewed.       Assessment & Plan:   1. Cellulitis of back except buttock   Current Outpatient Prescriptions:  .  doxycycline (VIBRA-TABS) 100 MG tablet, Take 1 tablet (100 mg total) by mouth 2 (two) times daily. 1 po bid, Disp: 42 tablet, Rfl: 0  Continue all other maintenance medications as listed above.  Follow up plan: Return if symptoms worsen or fail to improve.  Educational handout given for health maintenance  Terald Sleeper PA-C [facility] [address]  [place], [place] [address] [phone]   08/05/2016, 9:50 PM

## 2016-09-23 ENCOUNTER — Ambulatory Visit (INDEPENDENT_AMBULATORY_CARE_PROVIDER_SITE_OTHER): Payer: 59 | Admitting: Physician Assistant

## 2016-09-23 ENCOUNTER — Encounter: Payer: Self-pay | Admitting: Physician Assistant

## 2016-09-23 VITALS — BP 119/78 | HR 101 | Temp 97.7°F | Ht 64.0 in | Wt 229.8 lb

## 2016-09-23 DIAGNOSIS — R635 Abnormal weight gain: Secondary | ICD-10-CM

## 2016-09-23 DIAGNOSIS — R002 Palpitations: Secondary | ICD-10-CM | POA: Diagnosis not present

## 2016-09-23 MED ORDER — PHENTERMINE HCL 37.5 MG PO CAPS: 37.5000 mg | ORAL_CAPSULE | ORAL | 1 refills | Status: DC

## 2016-09-23 NOTE — Progress Notes (Signed)
BP 119/78   Pulse (!) 101   Temp 97.7 F (36.5 C) (Oral)   Ht 5\' 4"  (1.626 m)   Wt 229 lb 12.8 oz (104.2 kg)   BMI 39.45 kg/m    Subjective:    Patient ID: Raven [name], female    DOB: 10/31/1990, 26 y.o.   MRN: [id number]  HPI: Raven [name] is a 26 y.o. female presenting on 09/23/2016 for Weight Loss (discuss weight loss options); Palpitations; and Excessive Sweating (Patient states that she broke out into a sweat while at work)  Patient comes in for weight gain and would like to work on weight loss program. In the past she has taken phentermine. She will take Advil as weight but then quickly gain it back. She has always had difficulty with her weight. Last year she was doing cross fit but had to exercise a lot and he corrected keep weight off. We have discussed [facility]. She is looking to going to their seminar and being established there. She had questions about Saxenda. I told her that we have to go through several attempts in order to get the insurance to pay for. But that it is possible in the future. She is given a work on increasing protein in her diet and decreasing calories overall. She does have a strong family history of hypothyroidism. We will check labs today.  Relevant past medical, surgical, family and social history reviewed and updated as indicated. Allergies and medications reviewed and updated.  Past Medical History:  Diagnosis Date  . Medical history non-contributory     Past Surgical History:  Procedure Laterality Date  . CESAREAN SECTION N/A 06/14/2014   Procedure: CESAREAN SECTION;  Surgeon: Bobbye Charleston, MD;  Location: [facility];  Service: Obstetrics;  Laterality: N/A;  . NO PAST SURGERIES      Review of Systems  Constitutional: Positive for fatigue and unexpected weight change. Negative for activity change and fever.  HENT: Negative.   Eyes: Negative.   Respiratory: Negative.  Negative for cough, chest  tightness and wheezing.   Cardiovascular: Positive for palpitations. Negative for chest pain and leg swelling.  Gastrointestinal: Negative.  Negative for abdominal pain.  Endocrine: Negative.   Genitourinary: Negative.  Negative for dysuria.  Musculoskeletal: Negative.  Negative for arthralgias.  Skin: Negative.   Neurological: Negative.     Allergies as of 09/23/2016      Reactions   Sulfamethoxazole-trimethoprim Rash      Medication List       Accurate as of 09/23/16  2:10 PM. Always use your most recent med list.          phentermine 37.5 MG capsule Take 1 capsule (37.5 mg total) by mouth every morning.          Objective:    BP 119/78   Pulse (!) 101   Temp 97.7 F (36.5 C) (Oral)   Ht 5\' 4"  (1.626 m)   Wt 229 lb 12.8 oz (104.2 kg)   BMI 39.45 kg/m   Allergies  Allergen Reactions  . Sulfamethoxazole-Trimethoprim Rash    Physical Exam  Constitutional: She is oriented to person, place, and time. She appears well-developed and well-nourished.  HENT:  Head: Normocephalic and atraumatic.  Right Ear: Tympanic membrane, external ear and ear canal normal.  Left Ear: Tympanic membrane, external ear and ear canal normal.  Nose: Nose normal. No rhinorrhea.  Mouth/Throat: Oropharynx is clear and moist and mucous membranes are normal. No  oropharyngeal exudate or posterior oropharyngeal erythema.  Eyes: Pupils are equal, round, and reactive to light. Conjunctivae and EOM are normal.  Neck: Normal range of motion. Neck supple.  Cardiovascular: Normal rate, regular rhythm, normal heart sounds and intact distal pulses.   Pulmonary/Chest: Effort normal and breath sounds normal.  Abdominal: Soft. Bowel sounds are normal.  Neurological: She is alert and oriented to person, place, and time. She has normal reflexes.  Skin: Skin is warm and dry. No rash noted.  Psychiatric: She has a normal mood and affect. Her behavior is normal. Judgment and thought content normal.  Nursing  note and vitals reviewed.       Assessment & Plan:   1. Weight gain  - phentermine 37.5 MG capsule; Take 1 capsule (37.5 mg total) by mouth every morning.  Dispense: 30 capsule; Refill: 1 - TSH  2. Palpitation  - TSH    Current Outpatient Prescriptions:  .  phentermine 37.5 MG capsule, Take 1 capsule (37.5 mg total) by mouth every morning., Disp: 30 capsule, Rfl: 1 Continue all other maintenance medications as listed above.  Follow up plan: Return in about 4 weeks (around 10/21/2016) for recheck.  Educational handout given for 1500 calorie diet  Terald Sleeper PA-C [facility] [address]  [place], [place] [address] [phone]   09/23/2016, 2:10 PM

## 2016-09-23 NOTE — Patient Instructions (Signed)
Breakfast: eggs 2-3 Or greek yogurt low fat DANNON 1 slice delightful [employer] bread  Lunch: 2 slice [employer] delightfully bread       Or Nature's Own Light 4 ounces chicken, turkey, roast beef 1 slice Thin sliced cheese Sargento Mustard ok 1 piece of fruit  Supper: 6 ounces lean meat 2 cups raw/cooked veg or 1 cup pintos, corn lima  3 snacks 100 calories or less  

## 2016-09-24 LAB — TSH: TSH: 1.78 u[IU]/mL (ref 0.450–4.500)

## 2016-10-28 ENCOUNTER — Encounter: Payer: Self-pay | Admitting: Physician Assistant

## 2016-10-28 ENCOUNTER — Ambulatory Visit: Payer: 59 | Admitting: Physician Assistant

## 2017-04-06 DIAGNOSIS — Z01419 Encounter for gynecological examination (general) (routine) without abnormal findings: Secondary | ICD-10-CM | POA: Diagnosis not present

## 2017-04-06 DIAGNOSIS — Z124 Encounter for screening for malignant neoplasm of cervix: Secondary | ICD-10-CM | POA: Diagnosis not present

## 2017-04-30 DIAGNOSIS — R222 Localized swelling, mass and lump, trunk: Secondary | ICD-10-CM | POA: Diagnosis not present

## 2017-05-05 ENCOUNTER — Other Ambulatory Visit: Payer: Self-pay | Admitting: Obstetrics and Gynecology

## 2017-05-05 DIAGNOSIS — M7989 Other specified soft tissue disorders: Secondary | ICD-10-CM

## 2017-05-25 ENCOUNTER — Ambulatory Visit
Admission: RE | Admit: 2017-05-25 | Discharge: 2017-05-25 | Disposition: A | Discharge status: Home / Self Care | Payer: 59 | Source: Ambulatory Visit | Attending: Obstetrics and Gynecology | Admitting: Obstetrics and Gynecology

## 2017-05-25 DIAGNOSIS — M7989 Other specified soft tissue disorders: Secondary | ICD-10-CM

## 2017-05-25 DIAGNOSIS — N6002 Solitary cyst of left breast: Secondary | ICD-10-CM | POA: Diagnosis not present

## 2017-06-17 DIAGNOSIS — H52223 Regular astigmatism, bilateral: Secondary | ICD-10-CM | POA: Diagnosis not present

## 2017-07-21 ENCOUNTER — Ambulatory Visit (INDEPENDENT_AMBULATORY_CARE_PROVIDER_SITE_OTHER): Payer: 59 | Admitting: Physician Assistant

## 2017-07-21 ENCOUNTER — Ambulatory Visit: Payer: 59 | Admitting: Physician Assistant

## 2017-07-21 ENCOUNTER — Encounter: Payer: Self-pay | Admitting: Physician Assistant

## 2017-07-21 VITALS — BP 115/84 | HR 101 | Ht 64.0 in | Wt 230.8 lb

## 2017-07-21 DIAGNOSIS — Z0000 Encounter for general adult medical examination without abnormal findings: Secondary | ICD-10-CM

## 2017-07-22 NOTE — Progress Notes (Signed)
..   BP 115/84   Pulse (!) 101   Ht 5\' 4"  (1.626 m)   Wt 230 lb 12.8 oz (104.7 kg)   BMI 39.62 kg/m    Subjective:    Patient ID: Raven [name], female    DOB: Aug 02, 1990, 27 y.o.   MRN: [id number]  HPI: Raven [name] is a 27 y.o. female presenting on 07/21/2017 for Annual Exam  Patient comes in for her annual exam.  She needs up to keep her insurance cheaper.  She reports that she is not having any difficulty at this time.  She has an IUD in place and is tolerating it well.  She is planning to go to a weight loss and bariatric meeting at [facility].  We are reporting that in the past she has tried weight watchers on multiple occasions.  She has never been checked for insulin resistance or an A1c lately.  She does have parents with strong history for diabetes and cardiac disease.  Past Medical History:  Diagnosis Date  . Medical history non-contributory    Relevant past medical, surgical, family and social history reviewed and updated as indicated. Interim medical history since our last visit reviewed. Allergies and medications reviewed and updated. DATA REVIEWED: CHART IN EPIC  Family History reviewed for pertinent findings.  Review of Systems  Constitutional: Positive for unexpected weight change. Negative for activity change, fatigue and fever.  HENT: Negative.   Eyes: Negative.   Respiratory: Negative.  Negative for cough.   Cardiovascular: Negative.  Negative for chest pain.  Gastrointestinal: Negative.  Negative for abdominal pain.  Endocrine: Negative.  Negative for cold intolerance, polydipsia, polyphagia and polyuria.  Genitourinary: Negative.  Negative for dysuria.  Musculoskeletal: Negative.   Skin: Negative.   Neurological: Negative.     Allergies as of 07/21/2017      Reactions   Sulfamethoxazole-trimethoprim Rash      Medication List    as of 07/21/2017 11:59 PM   You have not been prescribed any medications.          Objective:    BP 115/84   Pulse (!) 101   Ht 5\' 4"  (1.626 m)   Wt 230 lb 12.8 oz (104.7 kg)   BMI 39.62 kg/m   Allergies  Allergen Reactions  . Sulfamethoxazole-Trimethoprim Rash    Wt Readings from Last 3 Encounters:  07/21/17 230 lb 12.8 oz (104.7 kg)  09/23/16 229 lb 12.8 oz (104.2 kg)  08/04/16 228 lb (103.4 kg)    Physical Exam  Constitutional: She is oriented to person, place, and time. She appears well-developed and well-nourished.  HENT:  Head: Normocephalic and atraumatic.  Right Ear: Tympanic membrane, external ear and ear canal normal.  Left Ear: Tympanic membrane, external ear and ear canal normal.  Nose: Nose normal. No rhinorrhea.  Mouth/Throat: Oropharynx is clear and moist and mucous membranes are normal. No oropharyngeal exudate or posterior oropharyngeal erythema.  Eyes: Pupils are equal, round, and reactive to light. Conjunctivae and EOM are normal.  Neck: Normal range of motion. Neck supple.  Cardiovascular: Normal rate, regular rhythm, normal heart sounds and intact distal pulses.  Pulmonary/Chest: Effort normal and breath sounds normal.  Abdominal: Soft. Bowel sounds are normal.  Neurological: She is alert and oriented to person, place, and time. She has normal reflexes.  Skin: Skin is warm and dry. No rash noted.  Psychiatric: She has a normal mood and affect. Her behavior is normal. Judgment and thought content normal.  Results for orders placed or performed in visit on 09/23/16  TSH  Result Value Ref Range   TSH 1.780 0.450 - 4.500 uIU/mL      Assessment & Plan:   1. Well adult health check Weight loss and exercise encouraged. Can come for well labs. Considering A1c and serum insulin   Continue all other maintenance medications as listed above.  Follow up plan: No follow-ups on file.  Educational handout given for [place] PA-C [facility] [address]  [place], [place]  [address] [phone]   07/22/2017, 1:53 PM

## 2017-10-21 ENCOUNTER — Other Ambulatory Visit (HOSPITAL_COMMUNITY): Payer: Self-pay | Admitting: Surgery

## 2017-10-22 ENCOUNTER — Other Ambulatory Visit (HOSPITAL_COMMUNITY): Payer: Self-pay | Admitting: Surgery

## 2017-10-22 DIAGNOSIS — Z01818 Encounter for other preprocedural examination: Secondary | ICD-10-CM

## 2017-10-26 ENCOUNTER — Ambulatory Visit (HOSPITAL_COMMUNITY)
Admission: RE | Admit: 2017-10-26 | Discharge: 2017-10-26 | Disposition: A | Discharge status: Home / Self Care | Payer: 59 | Source: Ambulatory Visit | Attending: Surgery | Admitting: Surgery

## 2017-10-26 DIAGNOSIS — Z01810 Encounter for preprocedural cardiovascular examination: Secondary | ICD-10-CM | POA: Diagnosis not present

## 2017-10-28 ENCOUNTER — Ambulatory Visit (HOSPITAL_COMMUNITY)
Admission: RE | Admit: 2017-10-28 | Discharge: 2017-10-28 | Disposition: A | Discharge status: Home / Self Care | Payer: 59 | Source: Ambulatory Visit | Attending: Surgery | Admitting: Surgery

## 2017-10-28 DIAGNOSIS — K219 Gastro-esophageal reflux disease without esophagitis: Secondary | ICD-10-CM | POA: Insufficient documentation

## 2017-10-28 DIAGNOSIS — K449 Diaphragmatic hernia without obstruction or gangrene: Secondary | ICD-10-CM | POA: Insufficient documentation

## 2017-10-28 DIAGNOSIS — Z01818 Encounter for other preprocedural examination: Secondary | ICD-10-CM | POA: Diagnosis not present

## 2017-10-28 DIAGNOSIS — J219 Acute bronchiolitis, unspecified: Secondary | ICD-10-CM | POA: Diagnosis not present

## 2017-11-01 ENCOUNTER — Ambulatory Visit (INDEPENDENT_AMBULATORY_CARE_PROVIDER_SITE_OTHER): Payer: 59

## 2017-11-01 ENCOUNTER — Encounter (HOSPITAL_COMMUNITY): Payer: Self-pay | Admitting: Emergency Medicine

## 2017-11-01 ENCOUNTER — Ambulatory Visit (HOSPITAL_COMMUNITY)
Admission: EM | Admit: 2017-11-01 | Discharge: 2017-11-01 | Disposition: A | Discharge status: Home / Self Care | Payer: 59 | Attending: Family Medicine | Admitting: Family Medicine

## 2017-11-01 DIAGNOSIS — M25531 Pain in right wrist: Secondary | ICD-10-CM

## 2017-11-01 DIAGNOSIS — M25631 Stiffness of right wrist, not elsewhere classified: Secondary | ICD-10-CM

## 2017-11-01 MED ORDER — PREDNISONE 20 MG PO TABS: ORAL_TABLET | ORAL | 0 refills | Status: DC

## 2017-11-01 NOTE — ED Triage Notes (Signed)
Pt here for right wrist pain x 2 days; pt denies obvious injury

## 2017-11-01 NOTE — ED Provider Notes (Signed)
  MRN: [id number] DOB: 11/18/1990  Subjective:   Raven [name] is a 27 y.o. female presenting for 2 day history of right wrist pain over volar surface. Pain is constant dull sensation, worsened with extension of wrist, elicits a sharp sensation then. Has intermittent tingling of 4th-5th fingers over palmar surface. Denies trauma, warmth, swelling, redness. Has tried ibuprofen, lidocaine ointment, otc topical menthol. She is not currently taking any medications. Has a history of ganglion cyst. Denies history of carpal tunnel syndrome. 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 Denies neck pain.  reports that she has never smoked. She has never used smokeless tobacco. She reports that she does not drink alcohol or use drugs.     Allergies  Allergen Reactions  . Sulfamethoxazole-Trimethoprim Rash    Past Medical History:  Diagnosis Date  . Medical history non-contributory      Past Surgical History:  Procedure Laterality Date  . CESAREAN SECTION N/A 06/14/2014   Procedure: CESAREAN SECTION;  Surgeon: Bobbye Charleston, MD;  Location: [facility];  Service: Obstetrics;  Laterality: N/A;  . NO PAST SURGERIES      Objective:   Vitals: BP 136/90 (BP Location: Left Arm)   Pulse (!) 105   Temp 98.1 F (36.7 C) (Oral)   Resp 18   SpO2 100%   Physical Exam  Constitutional: She is oriented to person, place, and time. She appears well-developed and well-nourished.  Cardiovascular: Normal rate.  Pulmonary/Chest: Effort normal.  Musculoskeletal:       Right wrist: She exhibits decreased range of motion (radial flexion), tenderness (over area depicted) and bony tenderness. She exhibits no swelling, no effusion, no crepitus, no deformity and no laceration.       Arms: Neurological: She is alert and oriented to person, place, and time.    Dg Wrist Complete Right  Result Date: 11/01/2017 CLINICAL DATA:  Right wrist pain 3 days ago.   No known injury. EXAM: RIGHT WRIST - COMPLETE 3+ VIEW COMPARISON:  None. FINDINGS: There is no evidence of fracture or dislocation. There is no evidence of arthropathy or other focal bone abnormality. Soft tissues are unremarkable. IMPRESSION: Negative. Electronically Signed   By: Misty Stanley M.D.   On: 11/01/2017 17:56    Assessment and Plan :   Right wrist pain  Wrist stiffness, right  Given patient's severity of pain, desire to get back to work rather quickly, we decided to use a short steroid course. We did not have a viable wrist brace in clinic and therefore patient will use one of her own. Counseled patient on potential for adverse effects with medications prescribed today, patient verbalized understanding. Return-to-clinic precautions discussed, patient verbalized understanding.    Jaynee Eagles, PA-C 11/01/17 1815

## 2017-11-18 ENCOUNTER — Ambulatory Visit: Payer: Self-pay | Admitting: Skilled Nursing Facility1

## 2017-12-30 ENCOUNTER — Encounter: Payer: Self-pay | Admitting: Physician Assistant

## 2018-01-01 ENCOUNTER — Other Ambulatory Visit: Payer: Self-pay | Admitting: Physician Assistant

## 2018-01-01 MED ORDER — SCOPOLAMINE 1 MG/3DAYS TD PT72: 1.0000 | MEDICATED_PATCH | TRANSDERMAL | 0 refills | Status: DC

## 2018-01-01 MED ORDER — ONDANSETRON 8 MG PO TBDP: 8.0000 mg | ORAL_TABLET | Freq: Three times a day (TID) | ORAL | 0 refills | Status: DC | PRN

## 2018-01-27 ENCOUNTER — Ambulatory Visit: Payer: Self-pay | Admitting: Psychiatry

## 2018-04-23 ENCOUNTER — Encounter: Payer: Self-pay | Admitting: Physician Assistant

## 2018-04-23 ENCOUNTER — Other Ambulatory Visit: Payer: Self-pay | Admitting: Physician Assistant

## 2018-04-23 DIAGNOSIS — H9193 Unspecified hearing loss, bilateral: Secondary | ICD-10-CM

## 2018-04-27 ENCOUNTER — Other Ambulatory Visit: Payer: Self-pay | Admitting: Physician Assistant

## 2018-04-27 DIAGNOSIS — H9193 Unspecified hearing loss, bilateral: Secondary | ICD-10-CM

## 2018-05-16 ENCOUNTER — Encounter: Payer: Self-pay | Admitting: Physician Assistant

## 2018-05-17 ENCOUNTER — Other Ambulatory Visit: Payer: Self-pay | Admitting: Physician Assistant

## 2018-05-17 DIAGNOSIS — N644 Mastodynia: Secondary | ICD-10-CM

## 2018-07-26 ENCOUNTER — Ambulatory Visit (INDEPENDENT_AMBULATORY_CARE_PROVIDER_SITE_OTHER): Payer: 59 | Admitting: Otolaryngology

## 2018-07-26 DIAGNOSIS — H8003 Otosclerosis involving oval window, nonobliterative, bilateral: Secondary | ICD-10-CM

## 2018-07-26 DIAGNOSIS — H903 Sensorineural hearing loss, bilateral: Secondary | ICD-10-CM

## 2018-07-26 DIAGNOSIS — H900 Conductive hearing loss, bilateral: Secondary | ICD-10-CM

## 2018-07-27 ENCOUNTER — Encounter: Payer: Self-pay | Admitting: Physician Assistant

## 2018-07-28 ENCOUNTER — Other Ambulatory Visit: Payer: Self-pay | Admitting: Physician Assistant

## 2018-07-28 DIAGNOSIS — H9193 Unspecified hearing loss, bilateral: Secondary | ICD-10-CM

## 2018-08-05 ENCOUNTER — Encounter: Payer: Self-pay | Admitting: Physician Assistant

## 2018-08-06 ENCOUNTER — Telehealth: Payer: Self-pay | Admitting: Physician Assistant

## 2018-08-11 ENCOUNTER — Other Ambulatory Visit: Payer: Self-pay | Admitting: Physician Assistant

## 2018-08-11 DIAGNOSIS — H8093 Unspecified otosclerosis, bilateral: Secondary | ICD-10-CM

## 2018-08-23 ENCOUNTER — Encounter: Payer: Self-pay | Admitting: Physician Assistant

## 2018-09-02 ENCOUNTER — Encounter: Payer: Self-pay | Admitting: Physician Assistant

## 2018-09-03 ENCOUNTER — Ambulatory Visit (INDEPENDENT_AMBULATORY_CARE_PROVIDER_SITE_OTHER): Payer: No Typology Code available for payment source | Admitting: Physician Assistant

## 2018-09-03 DIAGNOSIS — F329 Major depressive disorder, single episode, unspecified: Secondary | ICD-10-CM | POA: Diagnosis not present

## 2018-09-03 DIAGNOSIS — F419 Anxiety disorder, unspecified: Secondary | ICD-10-CM

## 2018-09-03 DIAGNOSIS — H8090 Unspecified otosclerosis, unspecified ear: Secondary | ICD-10-CM | POA: Insufficient documentation

## 2018-09-03 DIAGNOSIS — H8093 Unspecified otosclerosis, bilateral: Secondary | ICD-10-CM | POA: Diagnosis not present

## 2018-09-03 DIAGNOSIS — F32A Depression, unspecified: Secondary | ICD-10-CM

## 2018-09-03 MED ORDER — ESCITALOPRAM OXALATE 10 MG PO TABS: 10.0000 mg | ORAL_TABLET | Freq: Every day | ORAL | 1 refills | Status: DC

## 2018-09-03 NOTE — Progress Notes (Signed)
      Telephone visit  Subjective: CC: Stress PCP: Terald Sleeper, PA-C IOX:[id number] Raven [name] is a 28 y.o. female calls for telephone consult today. Patient provides verbal consent for consult held via phone.  Patient is identified with 2 separate identifiers.  At this time the entire area is on COVID-19 social distancing and stay home orders are in place.  Patient is of higher risk and therefore we are performing this by a virtual method.  Location of patient: home Location of provider: HOME Others present for call: No  The patient is having a visit related to the large amount of stress that she is under.  She has just had 2 grandparents passed away within 2 months of each other.  She has been diagnosed with otosclerosis, probably bilateral.  She is not sure what is going to happen with the treatment.  She had had very low hours through COVID-19 restrictions.  She was having to use a lot of vacation time.  She is just extremely anxious over a lot of things.  Other people have noticed her being more on edge.  She reports that several years ago after she delivered her child she did take Lexapro for depression.  She states that she tolerated it very well.  At this time she would like to restart it if at all possible.  We have discussed that this sounds like a very good plan and that if she has any difficulties with the please let us know.   ROS: Per HPI  Allergies  Allergen Reactions  . Sulfamethoxazole-Trimethoprim Rash   Past Medical History:  Diagnosis Date  . Medical history non-contributory     Current Outpatient Medications:  .  ondansetron (ZOFRAN ODT) 8 MG disintegrating tablet, Take 1 tablet (8 mg total) by mouth every 8 (eight) hours as needed for nausea or vomiting., Disp: 20 tablet, Rfl: 0 .  predniSONE (DELTASONE) 20 MG tablet, Take 2 tablets daily with breakfast., Disp: 10 tablet, Rfl: 0 .  scopolamine (TRANSDERM-SCOP, 1.5 MG,) 1 MG/3DAYS, Place 1 patch (1.5 mg total)  onto the skin every 3 (three) days., Disp: 3 patch, Rfl: 0 .  escitalopram (LEXAPRO) 10 MG tablet, Take 1 tablet (10 mg total) by mouth daily., Disp: 90 tablet, Rfl: 1  Assessment/ Plan: 28 y.o. female   1. Anxiety and depression - escitalopram (LEXAPRO) 10 MG tablet; Take 1 tablet (10 mg total) by mouth daily.  Dispense: 90 tablet; Refill: 1  2. Otosclerosis of both ears Continue with otolaryngology at [facility]    No follow-ups on file.  Continue all other maintenance medications as listed above.  Start time: 3:08PM End time: 3:17 PM  Meds ordered this encounter  Medications  . escitalopram (LEXAPRO) 10 MG tablet    Sig: Take 1 tablet (10 mg total) by mouth daily.    Dispense:  90 tablet    Refill:  1    Order Specific Question:   Supervising Provider    Answer:   [name] [[id number]]    Particia Nearing PA-C [facility] [phone]

## 2018-09-17 ENCOUNTER — Encounter: Payer: 59 | Admitting: Physician Assistant

## 2018-09-20 ENCOUNTER — Encounter: Payer: Self-pay | Admitting: Physician Assistant

## 2018-09-21 ENCOUNTER — Other Ambulatory Visit: Payer: Self-pay | Admitting: Physician Assistant

## 2018-09-21 DIAGNOSIS — M79643 Pain in unspecified hand: Secondary | ICD-10-CM

## 2018-09-22 ENCOUNTER — Encounter: Payer: Self-pay | Admitting: Physician Assistant

## 2018-09-26 ENCOUNTER — Encounter: Payer: Self-pay | Admitting: Physician Assistant

## 2018-09-27 ENCOUNTER — Other Ambulatory Visit: Payer: Self-pay | Admitting: Physician Assistant

## 2018-09-27 DIAGNOSIS — H8093 Unspecified otosclerosis, bilateral: Secondary | ICD-10-CM

## 2018-09-27 NOTE — Progress Notes (Signed)
oto

## 2018-09-28 ENCOUNTER — Telehealth: Payer: Self-pay | Admitting: Physician Assistant

## 2018-09-28 NOTE — Telephone Encounter (Signed)
Dr. Redmond Baseman office has referral and appointment is scheduled for 10/07/18.  Per their office we do not need to do anything else.

## 2018-09-28 NOTE — Telephone Encounter (Signed)
Thank you so much.  We had sone so much for her on every step that I was trying to be really proactive

## 2018-10-06 DIAGNOSIS — H90A31 Mixed conductive and sensorineural hearing loss, unilateral, right ear with restricted hearing on the contralateral side: Secondary | ICD-10-CM | POA: Insufficient documentation

## 2018-10-06 DIAGNOSIS — H9012 Conductive hearing loss, unilateral, left ear, with unrestricted hearing on the contralateral side: Secondary | ICD-10-CM | POA: Insufficient documentation

## 2018-10-12 DIAGNOSIS — N809 Endometriosis, unspecified: Secondary | ICD-10-CM | POA: Insufficient documentation

## 2018-10-29 ENCOUNTER — Other Ambulatory Visit: Payer: Self-pay

## 2018-11-01 ENCOUNTER — Encounter: Payer: Self-pay | Admitting: Physician Assistant

## 2018-11-01 ENCOUNTER — Other Ambulatory Visit: Payer: Self-pay

## 2018-11-01 ENCOUNTER — Ambulatory Visit (INDEPENDENT_AMBULATORY_CARE_PROVIDER_SITE_OTHER): Payer: No Typology Code available for payment source | Admitting: Physician Assistant

## 2018-11-01 VITALS — BP 117/81 | HR 96 | Temp 98.4°F | Ht 64.0 in | Wt 234.4 lb

## 2018-11-01 DIAGNOSIS — Z0001 Encounter for general adult medical examination with abnormal findings: Secondary | ICD-10-CM

## 2018-11-01 DIAGNOSIS — H8093 Unspecified otosclerosis, bilateral: Secondary | ICD-10-CM

## 2018-11-01 DIAGNOSIS — H9193 Unspecified hearing loss, bilateral: Secondary | ICD-10-CM | POA: Diagnosis not present

## 2018-11-01 DIAGNOSIS — F419 Anxiety disorder, unspecified: Secondary | ICD-10-CM | POA: Diagnosis not present

## 2018-11-01 DIAGNOSIS — Z0000 Encounter for general adult medical examination without abnormal findings: Secondary | ICD-10-CM | POA: Insufficient documentation

## 2018-11-01 DIAGNOSIS — F329 Major depressive disorder, single episode, unspecified: Secondary | ICD-10-CM

## 2018-11-01 MED ORDER — ESCITALOPRAM OXALATE 10 MG PO TABS: 10.0000 mg | ORAL_TABLET | Freq: Every day | ORAL | 1 refills | Status: DC

## 2018-11-01 NOTE — Patient Instructions (Signed)
Health Maintenance, Female Adopting a healthy lifestyle and getting preventive care are important in promoting health and wellness. Ask your health care provider about:  The right schedule for you to have regular tests and exams.  Things you can do on your own to prevent diseases and keep yourself healthy. What should I know about diet, weight, and exercise? Eat a healthy diet   Eat a diet that includes plenty of vegetables, fruits, low-fat dairy products, and lean protein.  Do not eat a lot of foods that are high in solid fats, added sugars, or sodium. Maintain a healthy weight Body mass index (BMI) is used to identify weight problems. It estimates body fat based on height and weight. Your health care provider can help determine your BMI and help you achieve or maintain a healthy weight. Get regular exercise Get regular exercise. This is one of the most important things you can do for your health. Most adults should:  Exercise for at least 150 minutes each week. The exercise should increase your heart rate and make you sweat (moderate-intensity exercise).  Do strengthening exercises at least twice a week. This is in addition to the moderate-intensity exercise.  Spend less time sitting. Even light physical activity can be beneficial. Watch cholesterol and blood lipids Have your blood tested for lipids and cholesterol at 29 years of age, then have this test every 5 years. Have your cholesterol levels checked more often if:  Your lipid or cholesterol levels are high.  You are older than 29 years of age.  You are at high risk for heart disease. What should I know about cancer screening? Depending on your health history and family history, you may need to have cancer screening at various ages. This may include screening for:  Breast cancer.  Cervical cancer.  Colorectal cancer.  Skin cancer.  Lung cancer. What should I know about heart disease, diabetes, and high blood  pressure? Blood pressure and heart disease  High blood pressure causes heart disease and increases the risk of stroke. This is more likely to develop in people who have high blood pressure readings, are of African descent, or are overweight.  Have your blood pressure checked: ? Every 3-5 years if you are 18-39 years of age. ? Every year if you are 40 years old or older. Diabetes Have regular diabetes screenings. This checks your fasting blood sugar level. Have the screening done:  Once every three years after age 40 if you are at a normal weight and have a low risk for diabetes.  More often and at a younger age if you are overweight or have a high risk for diabetes. What should I know about preventing infection? Hepatitis B If you have a higher risk for hepatitis B, you should be screened for this virus. Talk with your health care provider to find out if you are at risk for hepatitis B infection. Hepatitis C Testing is recommended for:  Everyone born from 1945 through 1965.  Anyone with known risk factors for hepatitis C. Sexually transmitted infections (STIs)  Get screened for STIs, including gonorrhea and chlamydia, if: ? You are sexually active and are younger than 29 years of age. ? You are older than 29 years of age and your health care provider tells you that you are at risk for this type of infection. ? Your sexual activity has changed since you were last screened, and you are at increased risk for chlamydia or gonorrhea. Ask your health care provider if   you are at risk.  Ask your health care provider about whether you are at high risk for HIV. Your health care provider may recommend a prescription medicine to help prevent HIV infection. If you choose to take medicine to prevent HIV, you should first get tested for HIV. You should then be tested every 3 months for as long as you are taking the medicine. Pregnancy  If you are about to stop having your period (premenopausal) and  you may become pregnant, seek counseling before you get pregnant.  Take 400 to 800 micrograms (mcg) of folic acid every day if you become pregnant.  Ask for birth control (contraception) if you want to prevent pregnancy. Osteoporosis and menopause Osteoporosis is a disease in which the bones lose minerals and strength with aging. This can result in bone fractures. If you are 65 years old or older, or if you are at risk for osteoporosis and fractures, ask your health care provider if you should:  Be screened for bone loss.  Take a calcium or vitamin D supplement to lower your risk of fractures.  Be given hormone replacement therapy (HRT) to treat symptoms of menopause. Follow these instructions at home: Lifestyle  Do not use any products that contain nicotine or tobacco, such as cigarettes, e-cigarettes, and chewing tobacco. If you need help quitting, ask your health care provider.  Do not use street drugs.  Do not share needles.  Ask your health care provider for help if you need support or information about quitting drugs. Alcohol use  Do not drink alcohol if: ? Your health care provider tells you not to drink. ? You are pregnant, may be pregnant, or are planning to become pregnant.  If you drink alcohol: ? Limit how much you use to 0-1 drink a day. ? Limit intake if you are breastfeeding.  Be aware of how much alcohol is in your drink. In the [place] one drink equals one 12 oz bottle of beer (355 mL), one 5 oz glass of wine (148 mL), or one 1 oz glass of hard liquor (44 mL). General instructions  Schedule regular health, dental, and eye exams.  Stay current with your vaccines.  Tell your health care provider if: ? You often feel depressed. ? You have ever been abused or do not feel safe at home. Summary  Adopting a healthy lifestyle and getting preventive care are important in promoting health and wellness.  Follow your health care provider's instructions about healthy  diet, exercising, and getting tested or screened for diseases.  Follow your health care provider's instructions on monitoring your cholesterol and blood pressure. This information is not intended to replace advice given to you by your health care provider. Make sure you discuss any questions you have with your health care provider. Document Released: 08/12/2010 Document Revised: 01/20/2018 Document Reviewed: 01/20/2018 Elsevier Patient Education  2020 [employer]  

## 2018-11-01 NOTE — Progress Notes (Signed)
BP 117/81   Pulse 96   Temp 98.4 F (36.9 C) (Temporal)   Ht 5\' 4"  (1.626 m)   Wt 234 lb 6.4 oz (106.3 kg)   SpO2 99%   BMI 40.23 kg/m    Subjective:    Patient ID: Raven [name], female    DOB: [birth year], 28 y.o.   MRN: [id number]  HPI: Raven [name] is a 28 y.o. female presenting on 11/01/2018 for Annual Exam  This patient comes in for annual well physical examination. All medications are reviewed today. There are no reports of any problems with the medications. All of the medical conditions are reviewed and updated.  Lab work is reviewed and will be ordered as medically necessary. There are no new problems reported with today's visit.  Patient reports doing well overall.   She is waiting until December 2020 to have surgery on her otosclerosis.  She is still currently working as a Marine scientist in Building services engineer.  Because of the COVID restrictions, her department was very restricted for about 2 months.  And she was out of work a lot.  Depression screen [facility] 2/9 11/01/2018 07/21/2017 09/23/2016 08/04/2016  Decreased Interest 0 0 0 0  Down, Depressed, Hopeless 0 0 0 0  PHQ - 2 Score 0 0 0 0     Past Medical History:  Diagnosis Date  . Medical history non-contributory    Relevant past medical, surgical, family and social history reviewed and updated as indicated. Interim medical history since our last visit reviewed. Allergies and medications reviewed and updated. DATA REVIEWED: CHART IN EPIC  Family History reviewed for pertinent findings.  Review of Systems  Constitutional: Negative.  Negative for activity change, fatigue and fever.  HENT: Positive for hearing loss.   Eyes: Negative.   Respiratory: Negative.  Negative for cough.   Cardiovascular: Negative.  Negative for chest pain.  Gastrointestinal: Negative.  Negative for abdominal pain.  Endocrine: Negative.   Genitourinary: Negative.  Negative for dysuria.  Musculoskeletal: Negative.   Skin: Negative.    Neurological: Negative.   Psychiatric/Behavioral: Positive for dysphoric mood.    Allergies as of 11/01/2018      Reactions   Sulfamethoxazole-trimethoprim Rash      Medication List       Accurate as of November 01, 2018  5:11 PM. If you have any questions, ask your nurse or doctor.        STOP taking these medications   ondansetron 8 MG disintegrating tablet Commonly known as: Zofran ODT Stopped by: Terald Sleeper, PA-C   predniSONE 20 MG tablet Commonly known as: DELTASONE Stopped by: Terald Sleeper, PA-C   scopolamine 1 MG/3DAYS Commonly known as: Transderm-Scop (1.5 MG) Stopped by: Terald Sleeper, PA-C     TAKE these medications   escitalopram 10 MG tablet Commonly known as: Lexapro Take 1 tablet (10 mg total) by mouth daily.   Mirena (52 MG) 20 MCG/24HR IUD Generic drug: levonorgestrel Mirena 20 mcg/24 hours (5 yrs) 52 mg intrauterine device          Objective:    BP 117/81   Pulse 96   Temp 98.4 F (36.9 C) (Temporal)   Ht 5\' 4"  (1.626 m)   Wt 234 lb 6.4 oz (106.3 kg)   SpO2 99%   BMI 40.23 kg/m   Allergies  Allergen Reactions  . Sulfamethoxazole-Trimethoprim Rash    Wt Readings from Last 3 Encounters:  11/01/18 234 lb 6.4 oz (106.3 kg)  07/21/17  230 lb 12.8 oz (104.7 kg)  09/23/16 229 lb 12.8 oz (104.2 kg)    Physical Exam Constitutional:      Appearance: She is well-developed.  HENT:     Head: Normocephalic and atraumatic.     Right Ear: Tympanic membrane, ear canal and external ear normal.     Left Ear: Tympanic membrane, ear canal and external ear normal.     Nose: Nose normal. No rhinorrhea.     Mouth/Throat:     Pharynx: No oropharyngeal exudate or posterior oropharyngeal erythema.  Eyes:     Conjunctiva/sclera: Conjunctivae normal.     Pupils: Pupils are equal, round, and reactive to light.  Neck:     Musculoskeletal: Normal range of motion and neck supple.  Cardiovascular:     Rate and Rhythm: Normal rate and regular rhythm.      Heart sounds: Normal heart sounds.  Pulmonary:     Effort: Pulmonary effort is normal.     Breath sounds: Normal breath sounds.  Abdominal:     General: Bowel sounds are normal.     Palpations: Abdomen is soft.  Skin:    General: Skin is warm and dry.     Findings: No rash.  Neurological:     Mental Status: She is alert and oriented to person, place, and time.     Deep Tendon Reflexes: Reflexes are normal and symmetric.  Psychiatric:        Behavior: Behavior normal.        Thought Content: Thought content normal.        Judgment: Judgment normal.         Assessment & Plan:   1. Well adult exam Healthy habits discussed  2. Anxiety and depression - escitalopram (LEXAPRO) 10 MG tablet; Take 1 tablet (10 mg total) by mouth daily.  Dispense: 90 tablet; Refill: 1  3. Bilateral hearing loss, unspecified hearing loss type Surgery December 2020  4. Otosclerosis of both ears Surgery December 2020    Continue all other maintenance medications as listed above.  Follow up plan: No follow-ups on file.  Educational handout given for health maintenance  Terald Sleeper PA-C [facility] [address]  [place], [place] [address] [phone]   11/01/2018, 5:11 PM

## 2018-11-11 ENCOUNTER — Encounter: Payer: Self-pay | Admitting: Physician Assistant

## 2018-11-12 ENCOUNTER — Other Ambulatory Visit: Payer: Self-pay | Admitting: Physician Assistant

## 2018-11-12 DIAGNOSIS — M6530 Trigger finger, unspecified finger: Secondary | ICD-10-CM

## 2018-12-09 MED FILL — ESCITALOPRAM 10 MG TABLET: 10 | 90 days supply | Qty: 90 | Fill #0

## 2019-01-19 ENCOUNTER — Encounter: Payer: Self-pay | Admitting: Physician Assistant

## 2019-01-20 ENCOUNTER — Other Ambulatory Visit: Payer: Self-pay | Admitting: Physician Assistant

## 2019-01-20 MED ORDER — DOXYCYCLINE HYCLATE 100 MG PO TABS: 100.0000 mg | ORAL_TABLET | Freq: Two times a day (BID) | ORAL | 5 refills | Status: DC

## 2019-01-24 ENCOUNTER — Ambulatory Visit (INDEPENDENT_AMBULATORY_CARE_PROVIDER_SITE_OTHER): Payer: No Typology Code available for payment source | Admitting: Family Medicine

## 2019-01-24 ENCOUNTER — Encounter: Payer: Self-pay | Admitting: Family Medicine

## 2019-01-24 DIAGNOSIS — R1032 Left lower quadrant pain: Secondary | ICD-10-CM | POA: Diagnosis not present

## 2019-01-24 NOTE — Progress Notes (Signed)
   Virtual Visit via Telephone Note  I connected with Raven [name] on 01/24/19 at 10:06 AM by telephone and verified that I am speaking with the correct person using two identifiers. Raven [name] is currently located at home and nobody is currently with her during this visit. The provider, Loman Brooklyn, FNP is located in their home at time of visit.  I discussed the limitations, risks, security and privacy concerns of performing an evaluation and management service by telephone and the availability of in person appointments. I also discussed with the patient that there may be a patient responsible charge related to this service. The patient expressed understanding and agreed to proceed.  Subjective: PCP: Terald Sleeper, PA-C  Chief Complaint  Patient presents with  . Hernia   Patient reports Sunday a week ago she was moving furniture and the following day she noticed some left lower quadrant abdominal pain.  She reports it did get a little better throughout the week.  Over the weekend she reports it started to bother her a little more.  Initially her stomach hurt when pressure was applied but now it is not as tender.  She feels like she can feel something in her stomach but is not sure what.  Does not feel the pain is as low down as her ovary.  Does report she feels like the pain radiates to her vagina.  Patient is experiencing some mild nausea but feels it is related to the pain.  No vomiting.   ROS: Per HPI  Current Outpatient Medications:  .  doxycycline (VIBRA-TABS) 100 MG tablet, Take 1 tablet (100 mg total) by mouth 2 (two) times daily. Then maintain on one tab QD, Disp: 40 tablet, Rfl: 5 .  escitalopram (LEXAPRO) 10 MG tablet, Take 1 tablet (10 mg total) by mouth daily., Disp: 90 tablet, Rfl: 1 .  levonorgestrel (MIRENA, 52 MG,) 20 MCG/24HR IUD, Mirena 20 mcg/24 hours (5 yrs) 52 mg intrauterine device, Disp: , Rfl:   Allergies  Allergen Reactions  .  Sulfamethoxazole-Trimethoprim Rash   Past Medical History:  Diagnosis Date  . Medical history non-contributory     Observations/Objective: A&O  No respiratory distress or wheezing audible over the phone Mood, judgement, and thought processes all WNL  Assessment and Plan: 1. Left lower quadrant abdominal pain - Encouraged patient to take it easy and take Ibuprofen TID for several days.  If pain does not subside encouraged her to come in for an in person office visit for hands on assessment.   Follow Up Instructions:  I discussed the assessment and treatment plan with the patient. The patient was provided an opportunity to ask questions and all were answered. The patient agreed with the plan and demonstrated an understanding of the instructions.   The patient was advised to call back or seek an in-person evaluation if the symptoms worsen or if the condition fails to improve as anticipated.  The above assessment and management plan was discussed with the patient. The patient verbalized understanding of and has agreed to the management plan. Patient is aware to call the clinic if symptoms persist or worsen. Patient is aware when to return to the clinic for a follow-up visit. Patient educated on when it is appropriate to go to the emergency department.   Time call ended: 10:13 AM  I provided 11 minutes of non-face-to-face time during this encounter.  Hendricks Limes, MSN, APRN, FNP-C [facility] 01/24/19

## 2019-02-04 ENCOUNTER — Encounter (HOSPITAL_COMMUNITY): Payer: Self-pay | Admitting: *Deleted

## 2019-02-04 ENCOUNTER — Emergency Department (HOSPITAL_COMMUNITY)
Admission: EM | Admit: 2019-02-04 | Discharge: 2019-02-04 | Disposition: A | Discharge status: Home / Self Care | Payer: No Typology Code available for payment source | Attending: Emergency Medicine | Admitting: Emergency Medicine

## 2019-02-04 ENCOUNTER — Other Ambulatory Visit: Payer: Self-pay

## 2019-02-04 DIAGNOSIS — R42 Dizziness and giddiness: Secondary | ICD-10-CM | POA: Insufficient documentation

## 2019-02-04 DIAGNOSIS — Z79899 Other long term (current) drug therapy: Secondary | ICD-10-CM | POA: Diagnosis not present

## 2019-02-04 DIAGNOSIS — R112 Nausea with vomiting, unspecified: Secondary | ICD-10-CM | POA: Insufficient documentation

## 2019-02-04 LAB — CBC
HCT: 48.3 % — ABNORMAL HIGH (ref 36.0–46.0)
Hemoglobin: 15.7 g/dL — ABNORMAL HIGH (ref 12.0–15.0)
MCH: 30.9 pg (ref 26.0–34.0)
MCHC: 32.5 g/dL (ref 30.0–36.0)
MCV: 95.1 fL (ref 80.0–100.0)
Platelets: 264 10*3/uL (ref 150–400)
RBC: 5.08 MIL/uL (ref 3.87–5.11)
RDW: 11.9 % (ref 11.5–15.5)
WBC: 11.7 10*3/uL — ABNORMAL HIGH (ref 4.0–10.5)
nRBC: 0 % (ref 0.0–0.2)

## 2019-02-04 LAB — BASIC METABOLIC PANEL
Anion gap: 10 (ref 5–15)
BUN: 12 mg/dL (ref 6–20)
CO2: 23 mmol/L (ref 22–32)
Calcium: 9.5 mg/dL (ref 8.9–10.3)
Chloride: 101 mmol/L (ref 98–111)
Creatinine, Ser: 0.87 mg/dL (ref 0.44–1.00)
GFR calc Af Amer: >60 mL/min (ref >60)
GFR calc non Af Amer: >60 mL/min (ref >60)
Glucose, Bld: 103 mg/dL — ABNORMAL HIGH (ref 70–99)
Potassium: 4.2 mmol/L (ref 3.5–5.1)
Sodium: 134 mmol/L — ABNORMAL LOW (ref 135–145)

## 2019-02-04 MED ORDER — ONDANSETRON 8 MG PO TBDP
8.0000 mg | ORAL_TABLET | Freq: Once | ORAL | Status: DC
  Filled 2019-02-04: qty 1

## 2019-02-04 MED ORDER — ONDANSETRON 4 MG PO TBDP: 4.0000 mg | ORAL_TABLET | Freq: Three times a day (TID) | ORAL | 0 refills | Status: DC | PRN

## 2019-02-04 MED ORDER — PROMETHAZINE HCL 25 MG/ML IJ SOLN
25.0000 mg | Freq: Once | INTRAMUSCULAR | Status: DC
  Filled 2019-02-04: qty 1

## 2019-02-04 MED ORDER — PROMETHAZINE HCL 25 MG/ML IJ SOLN
25.0000 mg | Freq: Once | INTRAMUSCULAR | Status: AC
  Administered 2019-02-04: 25 mg via INTRAVENOUS
  Filled 2019-02-04: qty 1

## 2019-02-04 MED ORDER — MECLIZINE HCL 12.5 MG PO TABS: 25.0000 mg | ORAL_TABLET | Freq: Once | ORAL | Status: DC

## 2019-02-04 MED ORDER — PROMETHAZINE HCL 25 MG PO TABS: 25.0000 mg | ORAL_TABLET | Freq: Four times a day (QID) | ORAL | 0 refills | Status: DC | PRN

## 2019-02-04 MED ORDER — PROMETHAZINE HCL 25 MG RE SUPP: 25.0000 mg | Freq: Four times a day (QID) | RECTAL | 0 refills | Status: DC | PRN

## 2019-02-04 MED ORDER — SODIUM CHLORIDE 0.9 % IV BOLUS
1000.0000 mL | Freq: Once | INTRAVENOUS | Status: AC
  Administered 2019-02-04: 12:00:00 1000 mL via INTRAVENOUS

## 2019-02-04 MED ORDER — DIAZEPAM 5 MG/ML IJ SOLN
2.5000 mg | Freq: Once | INTRAMUSCULAR | Status: AC
  Administered 2019-02-04: 2.5 mg via INTRAVENOUS
  Filled 2019-02-04: qty 2

## 2019-02-04 MED ORDER — MECLIZINE HCL 25 MG PO TABS: 25.0000 mg | ORAL_TABLET | Freq: Three times a day (TID) | ORAL | 0 refills | Status: DC | PRN

## 2019-02-04 MED ORDER — PROMETHAZINE HCL 12.5 MG PO TABS
25.0000 mg | ORAL_TABLET | Freq: Once | ORAL | Status: DC
  Filled 2019-02-04: qty 2

## 2019-02-04 NOTE — ED Provider Notes (Signed)
[facility] EMERGENCY DEPARTMENT Provider Note   CSN: [id number] Arrival date & time: 02/04/19  [id number]     History Chief Complaint  Patient presents with  . Emesis    Raven [name] is a 28 y.o. female.  HPI   This very pleasant 28 year old female has a history of some hearing loss secondary to otosclerosis and required surgical resection of her left middle ear which occurred recently.  For the last several days she has had some persistent but progressive nausea, vertigo and has been vomiting repeatedly.  She has been trying over-the-counter medications, she has tried without any significant relief and continues to feel sick.  She has constant vertigo which does not seem to be positional.  She does not think she is pregnant.  Past Medical History:  Diagnosis Date  . Medical history non-contributory     Patient Active Problem List   Diagnosis Date Noted  . Bilateral hearing loss 11/01/2018  . Well adult exam 11/01/2018  . Conductive hearing loss in left ear 10/06/2018  . Anxiety and depression 09/03/2018  . Otosclerosis 09/03/2018  . Cesarean delivery delivered 06/15/2014    Past Surgical History:  Procedure Laterality Date  . CESAREAN SECTION N/A 06/14/2014   Procedure: CESAREAN SECTION;  Surgeon: Bobbye Charleston, MD;  Location: [facility];  Service: Obstetrics;  Laterality: N/A;  . NO PAST SURGERIES       OB History    Gravida  1   Para  1   Term  1   Preterm      AB      Living  0     SAB      TAB      Ectopic      Multiple  0   Live Births              Family History  Problem Relation Age of Onset  . Diabetes Mother   . Hypertension Mother   . Thyroid disease Mother   . Diabetes Father   . Hypertension Father   . Hyperlipidemia Father     Social History   Tobacco Use  . Smoking status: Never Smoker  . Smokeless tobacco: Never Used  Substance Use Topics  . Alcohol use: No  . Drug use: No    Home Medications Prior to  Admission medications   Medication Sig Start Date End Date Taking? Authorizing Provider  doxycycline (VIBRA-TABS) 100 MG tablet Take 1 tablet (100 mg total) by mouth 2 (two) times daily. Then maintain on one tab QD 01/20/19   Terald Sleeper, PA-C  escitalopram (LEXAPRO) 10 MG tablet Take 1 tablet (10 mg total) by mouth daily. 11/01/18   Terald Sleeper, PA-C  levonorgestrel (MIRENA, 52 MG,) 20 MCG/24HR IUD Mirena 20 mcg/24 hours (5 yrs) 52 mg intrauterine device    [provider]    Allergies    Sulfamethoxazole-trimethoprim  Review of Systems   Review of Systems  Constitutional: Negative for fever.  Gastrointestinal: Positive for nausea and vomiting.  Neurological: Positive for dizziness.    Physical Exam Updated Vital Signs BP (!) 129/91 (BP Location: Right Arm)   Pulse 77   Temp 98.1 F (36.7 C) (Oral)   Resp 16   Ht 1.626 m (5\' 4" )   Wt 104.3 kg   SpO2 100%   BMI 39.48 kg/m   Physical Exam Constitutional:      Comments: The patient's is uncomfortable appearing, nauseated with active vertigo  HENT:  Head: Normocephalic and atraumatic.     Ears:     Comments: Right tympanic membrane is normal, left tympanic membrane is poorly visualized, there is no significant bleeding or purulence in the external auditory canal    Nose: Nose normal.     Mouth/Throat:     Mouth: Mucous membranes are moist.     Pharynx: No oropharyngeal exudate or posterior oropharyngeal erythema.  Eyes:     Extraocular Movements: Extraocular movements intact.     Conjunctiva/sclera: Conjunctivae normal.     Pupils: Pupils are equal, round, and reactive to light.  Cardiovascular:     Rate and Rhythm: Normal rate and regular rhythm.     Pulses: Normal pulses.  Pulmonary:     Effort: Pulmonary effort is normal.     Breath sounds: Normal breath sounds.  Musculoskeletal:        General: No swelling.     Right lower leg: No edema.     Left lower leg: No edema.  Skin:    General: Skin is  warm and dry.     Findings: No rash.  Neurological:     Comments: The patient is able to speak in full sentences with clarity, memory is intact, normal strength in all 4 extremities and able to move all 4 extremities with normal coordination.  There is no facial droop.  She has no obvious visible nystagmus     ED Results / Procedures / Treatments   Labs (all labs ordered are listed, but only abnormal results are displayed) Labs Reviewed  CBC  BASIC METABOLIC PANEL    EKG None  Radiology No results found.  Procedures Procedures (including critical care time)  Medications Ordered in ED Medications  promethazine (PHENERGAN) injection 25 mg (has no administration in time range)  sodium chloride 0.9 % bolus 1,000 mL (has no administration in time range)  diazepam (VALIUM) injection 2.5 mg (has no administration in time range)    ED Course  I have reviewed the triage vital signs and the nursing notes.  Pertinent labs & imaging results that were available during my care of the patient were reviewed by me and considered in my medical decision making (see chart for details).  Clinical Course as of Feb 04 1407  Fri Feb 04, 2019  1359 Reevaluated at 2:00 PM, feeling much better, electrolytes are normal, CBC shows mild leukocytosis, nonspecific.  Vital signs remained stable, antiemetics and Valium have helped, she still has some vertigo but the nausea is much better.  I suspect the vertigo will be somewhat persistent as she gradually feels, she will go home with medicines for prescription antiemetics which she currently does not have at home.  She is agreeable to the plan and willing to return should symptoms worsen   [BM]    Clinical Course User Index [BM] Noemi Chapel, MD   MDM Rules/Calculators/A&P                      Uncomfortable appearing, persistent vertigo likely related to previous surgical ear procedure, needs fluids antiemetics  Final Clinical Impression(s) / ED  Diagnoses Final diagnoses:  Vertigo  Non-intractable vomiting with nausea, unspecified vomiting type    Rx / DC Orders ED Discharge Orders         Ordered    ondansetron (ZOFRAN ODT) 4 MG disintegrating tablet  Every 8 hours PRN,   Status:  Discontinued     02/04/19 1401    promethazine (PHENERGAN) 25 MG  tablet  Every 6 hours PRN     02/04/19 1401    promethazine (PHENERGAN) 25 MG suppository  Every 6 hours PRN     02/04/19 1401    meclizine (ANTIVERT) 25 MG tablet  3 times daily PRN     02/04/19 1401    ondansetron (ZOFRAN ODT) 4 MG disintegrating tablet  Every 8 hours PRN     02/04/19 1408           Noemi Chapel, MD 02/04/19 1409

## 2019-02-04 NOTE — Discharge Instructions (Addendum)
Your blood work was unremarkable - thankfully, You may benefit from the following medicines:  Zofran every 6 hours as needed for nausea Phenergan either by Mouth or by Rectum for nausea / vomiting Meclizine - every 8 hours as needed for vertigo.  ER for worsening symptoms including increased vomiting, pain, fever

## 2019-02-04 NOTE — ED Triage Notes (Signed)
Patient presents to the ED with nausea, dizziness and vomiting for 3 days after having surgical procedure on left ear.  Patient given nausea medication, zofran, valium and dramamine with ineffective results.

## 2019-02-14 ENCOUNTER — Encounter: Payer: Self-pay | Admitting: Family Medicine

## 2019-02-21 ENCOUNTER — Other Ambulatory Visit: Payer: Self-pay

## 2019-02-22 ENCOUNTER — Other Ambulatory Visit: Payer: Self-pay

## 2019-02-23 ENCOUNTER — Ambulatory Visit (INDEPENDENT_AMBULATORY_CARE_PROVIDER_SITE_OTHER): Payer: No Typology Code available for payment source | Admitting: Physician Assistant

## 2019-02-23 ENCOUNTER — Encounter: Payer: Self-pay | Admitting: Physician Assistant

## 2019-02-23 VITALS — BP 117/81 | HR 86 | Temp 98.6°F | Ht 64.0 in | Wt 240.6 lb

## 2019-02-23 DIAGNOSIS — K4090 Unilateral inguinal hernia, without obstruction or gangrene, not specified as recurrent: Secondary | ICD-10-CM

## 2019-02-25 ENCOUNTER — Encounter: Payer: Self-pay | Admitting: Physician Assistant

## 2019-02-25 NOTE — Progress Notes (Signed)
Acute Office Visit  Subjective:    Patient ID: Raven [name], female    DOB: 25-Jun-1990, 29 y.o.   MRN: [id number]  Chief Complaint  Patient presents with  . Abdominal Pain    ? Hernia     HPI  Patient reports that in recent weeks she has begun to feel somewhat of a prominence in the left lower quadrant at the inguinal fold.  She has worked as a Marine scientist over the years.  She is currently not doing any significant lifting.  It does cause some abdominal pain that time.  When when she lays down it does resolve.  She has not had any change in her bladder or bowel function.  Past Medical History:  Diagnosis Date  . Medical history non-contributory     Past Surgical History:  Procedure Laterality Date  . CESAREAN SECTION N/A 06/14/2014   Procedure: CESAREAN SECTION;  Surgeon: Bobbye Charleston, MD;  Location: [facility];  Service: Obstetrics;  Laterality: N/A;  . NO PAST SURGERIES      Family History  Problem Relation Age of Onset  . Diabetes Mother   . Hypertension Mother   . Thyroid disease Mother   . Diabetes Father   . Hypertension Father   . Hyperlipidemia Father     Social History   Socioeconomic History  . Marital status: Married    Spouse name: Not on file  . Number of children: Not on file  . Years of education: Not on file  . Highest education level: Not on file  Occupational History  . Not on file  Tobacco Use  . Smoking status: Never Smoker  . Smokeless tobacco: Never Used  Substance and Sexual Activity  . Alcohol use: No  . Drug use: No  . Sexual activity: Not on file  Other Topics Concern  . Not on file  Social History Narrative  . Not on file   Social Determinants of Health   Financial Resource Strain:   . Difficulty of Paying Living Expenses: Not on file  Food Insecurity:   . Worried About Charity fundraiser in the Last Year: Not on file  . Ran Out of Food in the Last Year: Not on file  Transportation Needs:   . Lack of Transportation  (Medical): Not on file  . Lack of Transportation (Non-Medical): Not on file  Physical Activity:   . Days of Exercise per Week: Not on file  . Minutes of Exercise per Session: Not on file  Stress:   . Feeling of Stress : Not on file  Social Connections:   . Frequency of Communication with Friends and Family: Not on file  . Frequency of Social Gatherings with Friends and Family: Not on file  . Attends Religious Services: Not on file  . Active Member of Clubs or Organizations: Not on file  . Attends Archivist Meetings: Not on file  . Marital Status: Not on file  Intimate Partner Violence:   . Fear of Current or Ex-Partner: Not on file  . Emotionally Abused: Not on file  . Physically Abused: Not on file  . Sexually Abused: Not on file    Outpatient Medications Prior to Visit  Medication Sig Dispense Refill  . escitalopram (LEXAPRO) 10 MG tablet Take 1 tablet (10 mg total) by mouth daily. 90 tablet 1  . levonorgestrel (MIRENA, 52 MG,) 20 MCG/24HR IUD Mirena 20 mcg/24 hours (5 yrs) 52 mg intrauterine device    . doxycycline (  VIBRA-TABS) 100 MG tablet Take 1 tablet (100 mg total) by mouth 2 (two) times daily. Then maintain on one tab QD 40 tablet 5  . meclizine (ANTIVERT) 25 MG tablet Take 1 tablet (25 mg total) by mouth 3 (three) times daily as needed for dizziness. 30 tablet 0  . ondansetron (ZOFRAN ODT) 4 MG disintegrating tablet Take 1 tablet (4 mg total) by mouth every 8 (eight) hours as needed for nausea. 10 tablet 0  . promethazine (PHENERGAN) 25 MG suppository Place 1 suppository (25 mg total) rectally every 6 (six) hours as needed for nausea or vomiting. 12 suppository 0  . promethazine (PHENERGAN) 25 MG tablet Take 1 tablet (25 mg total) by mouth every 6 (six) hours as needed for nausea or vomiting. 12 tablet 0   No facility-administered medications prior to visit.    Allergies  Allergen Reactions  . Sulfamethoxazole-Trimethoprim Rash    Review of Systems    Constitutional: Negative.   HENT: Negative.   Eyes: Negative.   Respiratory: Negative.   Gastrointestinal: Positive for abdominal distention and abdominal pain. Negative for anal bleeding, blood in stool, constipation, diarrhea, nausea, rectal pain and vomiting.  Genitourinary: Negative.        Objective:    Physical Exam Constitutional:      General: She is not in acute distress.    Appearance: Normal appearance. She is well-developed.  HENT:     Head: Normocephalic and atraumatic.  Cardiovascular:     Rate and Rhythm: Normal rate.  Pulmonary:     Effort: Pulmonary effort is normal.  Abdominal:     General: Abdomen is protuberant.     Palpations: Abdomen is soft.     Hernia: A hernia is present. Hernia is present in the left inguinal area.       Comments: Mild distention with Valsalva maneuver.  Reducible.  No signs of infection or gangrene  Skin:    General: Skin is warm and dry.     Findings: No rash.  Neurological:     Mental Status: She is alert and oriented to person, place, and time.     Deep Tendon Reflexes: Reflexes are normal and symmetric.     BP 117/81   Pulse 86   Temp 98.6 F (37 C) (Temporal)   Ht 5\' 4"  (1.626 m)   Wt 240 lb 9.6 oz (109.1 kg)   SpO2 98%   BMI 41.30 kg/m  Wt Readings from Last 3 Encounters:  02/23/19 240 lb 9.6 oz (109.1 kg)  02/04/19 230 lb (104.3 kg)  11/01/18 234 lb 6.4 oz (106.3 kg)    There are no preventive care reminders to display for this patient.  There are no preventive care reminders to display for this patient.   Lab Results  Component Value Date   TSH 1.780 09/23/2016   Lab Results  Component Value Date   WBC 11.7 (H) 02/04/2019   HGB 15.7 (H) 02/04/2019   HCT 48.3 (H) 02/04/2019   MCV 95.1 02/04/2019   PLT 264 02/04/2019   Lab Results  Component Value Date   NA 134 (L) 02/04/2019   K 4.2 02/04/2019   CO2 23 02/04/2019   GLUCOSE 103 (H) 02/04/2019   BUN 12 02/04/2019   CREATININE 0.87 02/04/2019    BILITOT 0.2 (L) 06/25/2014   ALKPHOS 106 06/25/2014   AST 16 06/25/2014   ALT 17 06/25/2014   PROT 7.5 06/25/2014   ALBUMIN 3.6 06/25/2014   CALCIUM 9.5 02/04/2019  ANIONGAP 10 02/04/2019   No results found for: CHOL No results found for: HDL No results found for: LDLCALC No results found for: TRIG No results found for: CHOLHDL No results found for: HGBA1C     Assessment & Plan:   Problem List Items Addressed This Visit    None    Visit Diagnoses    Hernia, inguinal, left    -  Primary    Watch and wait.  Avoid heavy lifting. Referred to general surgery when patient is ready.   No orders of the defined types were placed in this encounter.    Terald Sleeper, PA-C

## 2019-03-19 ENCOUNTER — Encounter: Payer: Self-pay | Admitting: Physician Assistant

## 2019-03-21 ENCOUNTER — Encounter: Payer: Self-pay | Admitting: Physician Assistant

## 2019-03-21 DIAGNOSIS — M25562 Pain in left knee: Secondary | ICD-10-CM

## 2019-03-22 ENCOUNTER — Encounter: Payer: Self-pay | Admitting: Physician Assistant

## 2019-03-22 ENCOUNTER — Ambulatory Visit (INDEPENDENT_AMBULATORY_CARE_PROVIDER_SITE_OTHER): Payer: No Typology Code available for payment source | Admitting: Physician Assistant

## 2019-03-22 DIAGNOSIS — M2550 Pain in unspecified joint: Secondary | ICD-10-CM

## 2019-03-22 DIAGNOSIS — H8093 Unspecified otosclerosis, bilateral: Secondary | ICD-10-CM | POA: Diagnosis not present

## 2019-03-22 DIAGNOSIS — M25562 Pain in left knee: Secondary | ICD-10-CM

## 2019-03-22 NOTE — Progress Notes (Signed)
         Telephone visit  Subjective: CC: Recurrent joint pain relieved with steroid, current knee pain PCP: Terald Sleeper, PA-C [phone] Raven [name] is a 29 y.o. female calls for telephone consult today. Patient provides verbal consent for consult held via phone.  Patient is identified with 2 separate identifiers.  At this time the entire area is on COVID-19 social distancing and stay home orders are in place.  Patient is of higher risk and therefore we are performing this by a virtual method.  Location of patient: Home Location of provider: HOME Others present for call: No  This patient has had a long varied history of joints that have bothered her.  She has had injections in many areas.  The steroid always helped significantly when she had on.  She also had the diagnosis of otosclerosis this year.  We are questioning whether she has some other autoimmune type condition.  We will have labs drawn in the near future.  She will come in and have them performed.  The order has been placed.  Her most current was a flareup of her left knee, it has caused significant pain.  There was no injury.  And this is always what seems to happen with the other areas.  She does have orthopedic appointment tomorrow.  She has had to trigger fingers that did not do that with an injection.  She has had an injection in her wrist and her hand and twice in her foot for what they thought were neuromas.  When she has these flareups she feels quite stiff, for instance she has been stiff in her knee until lunch the last few days.  She states that compression does help.  I have discussed with her that even if the labs that we perform are negative I would still like for her to go to rheumatology to have a further work-up if what we do is negative.    ROS: Per HPI  Allergies  Allergen Reactions  . Sulfamethoxazole-Trimethoprim Rash   Past Medical History:  Diagnosis Date  . Medical history non-contributory      Current Outpatient Medications:  .  escitalopram (LEXAPRO) 10 MG tablet, Take 1 tablet (10 mg total) by mouth daily., Disp: 90 tablet, Rfl: 1 .  levonorgestrel (MIRENA, 52 MG,) 20 MCG/24HR IUD, Mirena 20 mcg/24 hours (5 yrs) 52 mg intrauterine device, Disp: , Rfl:   Assessment/ Plan: 29 y.o. female   1. Multiple joint pain - Arthritis Panel - ANA,IFA RA Diag Pnl w/rflx Tit/Patn Rheumatology referral  2. Otosclerosis of both ears Rheumatology referral  3. Left knee pain, unspecified chronicity Rheumatology referral   No follow-ups on file.  Continue all other maintenance medications as listed above.  Start time: 1:58 PM End time: 2:13 PM  No orders of the defined types were placed in this encounter.   Particia Nearing PA-C [place] [phone]

## 2019-03-23 ENCOUNTER — Other Ambulatory Visit: Payer: Self-pay

## 2019-03-23 ENCOUNTER — Other Ambulatory Visit: Payer: No Typology Code available for payment source

## 2019-03-24 LAB — ARTHRITIS PANEL
Basophils Absolute: 0 10*3/uL (ref 0.0–0.2)
Basos: 0 %
EOS (ABSOLUTE): 0.3 10*3/uL (ref 0.0–0.4)
Eos: 3 %
Hematocrit: 42 % (ref 34.0–46.6)
Hemoglobin: 14.2 g/dL (ref 11.1–15.9)
Immature Grans (Abs): 0 10*3/uL (ref 0.0–0.1)
Immature Granulocytes: 0 %
Lymphocytes Absolute: 2.2 10*3/uL (ref 0.7–3.1)
Lymphs: 24 %
MCH: 31 pg (ref 26.6–33.0)
MCHC: 33.8 g/dL (ref 31.5–35.7)
MCV: 92 fL (ref 79–97)
Monocytes Absolute: 0.4 10*3/uL (ref 0.1–0.9)
Monocytes: 4 %
Neutrophils Absolute: 6.4 10*3/uL (ref 1.4–7.0)
Neutrophils: 69 %
Platelets: 253 10*3/uL (ref 150–450)
RBC: 4.58 x10E6/uL (ref 3.77–5.28)
RDW: 12.1 % (ref 11.7–15.4)
Rhuematoid fact SerPl-aCnc: <10 IU/mL (ref 0.0–13.9)
Sed Rate: 25 mm/hr (ref 0–32)
Uric Acid: 5.2 mg/dL (ref 2.6–6.2)
WBC: 9.2 10*3/uL (ref 3.4–10.8)

## 2019-03-24 LAB — ANA,IFA RA DIAG PNL W/RFLX TIT/PATN
ANA Titer 1: NEGATIVE
Cyclic Citrullin Peptide Ab: 8 units (ref 0–19)

## 2019-04-19 ENCOUNTER — Encounter: Payer: Self-pay | Admitting: Physician Assistant

## 2019-04-20 ENCOUNTER — Other Ambulatory Visit: Payer: Self-pay | Admitting: Family Medicine

## 2019-04-20 DIAGNOSIS — K4090 Unilateral inguinal hernia, without obstruction or gangrene, not specified as recurrent: Secondary | ICD-10-CM

## 2019-04-28 ENCOUNTER — Other Ambulatory Visit: Payer: Self-pay | Admitting: Family Medicine

## 2019-04-28 ENCOUNTER — Telehealth: Payer: Self-pay | Admitting: Physician Assistant

## 2019-04-28 DIAGNOSIS — K4090 Unilateral inguinal hernia, without obstruction or gangrene, not specified as recurrent: Secondary | ICD-10-CM

## 2019-04-28 NOTE — Telephone Encounter (Signed)
  REFERRAL REQUEST Telephone Note 04/28/2019  What type of referral do you need? General Surgery   Have you been seen at our office for this problem? Yes - Patient had a Ref sent to [facility] in [place] but the Patient called back and stated she would like to have her surgery done at the [facility] in [place] due to insurance costs and [facility] does not do their surgeries at the [facility] in [place]. (Advise that they may need an appointment with their PCP before a referral can be done)  Is there a particular doctor or location that you prefer? [facility] [place].  Patient notified that referrals can take up to a week or longer to process. If they haven't heard anything within a week they should call back and speak with the referral department.

## 2019-04-28 NOTE — Telephone Encounter (Signed)
Referral placed.

## 2019-05-08 ENCOUNTER — Encounter: Payer: Self-pay | Admitting: Physician Assistant

## 2019-05-10 ENCOUNTER — Encounter: Payer: Self-pay | Admitting: Family Medicine

## 2019-05-27 ENCOUNTER — Telehealth (INDEPENDENT_AMBULATORY_CARE_PROVIDER_SITE_OTHER): Payer: No Typology Code available for payment source | Admitting: Family Medicine

## 2019-05-27 DIAGNOSIS — F419 Anxiety disorder, unspecified: Secondary | ICD-10-CM

## 2019-05-27 DIAGNOSIS — F329 Major depressive disorder, single episode, unspecified: Secondary | ICD-10-CM

## 2019-05-27 NOTE — Progress Notes (Addendum)
Patient actually not taking the Lexapro anymore.  Does not need refills.  Will no charge the visit.  Can follow up prn

## 2019-05-27 NOTE — Addendum Note (Signed)
Addended by: [name] on: 05/27/2019 09:12 AM   Modules accepted: Orders

## 2019-06-09 ENCOUNTER — Other Ambulatory Visit (HOSPITAL_COMMUNITY): Payer: Self-pay | Admitting: Surgery

## 2019-06-09 ENCOUNTER — Other Ambulatory Visit: Payer: Self-pay | Admitting: Surgery

## 2019-06-09 DIAGNOSIS — R1904 Left lower quadrant abdominal swelling, mass and lump: Secondary | ICD-10-CM

## 2019-06-10 ENCOUNTER — Ambulatory Visit (HOSPITAL_BASED_OUTPATIENT_CLINIC_OR_DEPARTMENT_OTHER)
Admission: RE | Admit: 2019-06-10 | Discharge: 2019-06-10 | Disposition: A | Discharge status: Home / Self Care | Payer: No Typology Code available for payment source | Source: Ambulatory Visit | Attending: Surgery | Admitting: Surgery

## 2019-06-10 ENCOUNTER — Other Ambulatory Visit: Payer: Self-pay

## 2019-06-10 ENCOUNTER — Encounter (HOSPITAL_BASED_OUTPATIENT_CLINIC_OR_DEPARTMENT_OTHER): Payer: Self-pay

## 2019-06-10 DIAGNOSIS — R1904 Left lower quadrant abdominal swelling, mass and lump: Secondary | ICD-10-CM | POA: Insufficient documentation

## 2019-06-10 MED ORDER — IOHEXOL 300 MG/ML  SOLN
100.0000 mL | Freq: Once | INTRAMUSCULAR | Status: AC | PRN
  Administered 2019-06-10: 100 mL via INTRAVENOUS

## 2019-06-13 ENCOUNTER — Encounter: Payer: Self-pay | Admitting: Family Medicine

## 2019-06-14 ENCOUNTER — Other Ambulatory Visit: Payer: Self-pay | Admitting: Family Medicine

## 2019-06-14 DIAGNOSIS — T753XXA Motion sickness, initial encounter: Secondary | ICD-10-CM

## 2019-06-14 MED ORDER — SCOPOLAMINE 1 MG/3DAYS TD PT72: 1.0000 | MEDICATED_PATCH | TRANSDERMAL | 0 refills | Status: DC

## 2019-06-14 MED FILL — SCOPOLAMINE 1 MG/3DAYS PT72: 1 | 12 days supply | Qty: 4 | Fill #0

## 2019-07-22 DIAGNOSIS — R1900 Intra-abdominal and pelvic swelling, mass and lump, unspecified site: Secondary | ICD-10-CM | POA: Insufficient documentation

## 2019-08-02 ENCOUNTER — Other Ambulatory Visit: Payer: Self-pay | Admitting: Surgery

## 2019-08-02 DIAGNOSIS — R1904 Left lower quadrant abdominal swelling, mass and lump: Secondary | ICD-10-CM

## 2019-08-21 ENCOUNTER — Emergency Department (HOSPITAL_BASED_OUTPATIENT_CLINIC_OR_DEPARTMENT_OTHER)
Admission: EM | Admit: 2019-08-21 | Discharge: 2019-08-21 | Disposition: A | Discharge status: Home / Self Care | Payer: No Typology Code available for payment source | Attending: Emergency Medicine | Admitting: Emergency Medicine

## 2019-08-21 ENCOUNTER — Emergency Department (HOSPITAL_BASED_OUTPATIENT_CLINIC_OR_DEPARTMENT_OTHER): Payer: No Typology Code available for payment source

## 2019-08-21 ENCOUNTER — Encounter (HOSPITAL_BASED_OUTPATIENT_CLINIC_OR_DEPARTMENT_OTHER): Payer: Self-pay

## 2019-08-21 ENCOUNTER — Other Ambulatory Visit: Payer: Self-pay

## 2019-08-21 DIAGNOSIS — R109 Unspecified abdominal pain: Secondary | ICD-10-CM

## 2019-08-21 DIAGNOSIS — M7989 Other specified soft tissue disorders: Secondary | ICD-10-CM | POA: Diagnosis not present

## 2019-08-21 DIAGNOSIS — K0889 Other specified disorders of teeth and supporting structures: Secondary | ICD-10-CM | POA: Diagnosis present

## 2019-08-21 LAB — URINALYSIS, ROUTINE W REFLEX MICROSCOPIC
Bilirubin Urine: NEGATIVE
Glucose, UA: NEGATIVE mg/dL
Hgb urine dipstick: NEGATIVE
Ketones, ur: NEGATIVE mg/dL
Nitrite: NEGATIVE
Protein, ur: NEGATIVE mg/dL
Specific Gravity, Urine: 1.01 (ref 1.005–1.030)
pH: 6 (ref 5.0–8.0)

## 2019-08-21 LAB — COMPREHENSIVE METABOLIC PANEL
ALT: 62 U/L — ABNORMAL HIGH (ref 0–44)
AST: 52 U/L — ABNORMAL HIGH (ref 15–41)
Albumin: 4.4 g/dL (ref 3.5–5.0)
Alkaline Phosphatase: 75 U/L (ref 38–126)
Anion gap: 10 (ref 5–15)
BUN: 10 mg/dL (ref 6–20)
CO2: 26 mmol/L (ref 22–32)
Calcium: 9.3 mg/dL (ref 8.9–10.3)
Chloride: 102 mmol/L (ref 98–111)
Creatinine, Ser: 0.72 mg/dL (ref 0.44–1.00)
GFR calc Af Amer: >60 mL/min (ref >60)
GFR calc non Af Amer: >60 mL/min (ref >60)
Glucose, Bld: 92 mg/dL (ref 70–99)
Potassium: 3.6 mmol/L (ref 3.5–5.1)
Sodium: 138 mmol/L (ref 135–145)
Total Bilirubin: 0.4 mg/dL (ref 0.3–1.2)
Total Protein: 8.1 g/dL (ref 6.5–8.1)

## 2019-08-21 LAB — LIPASE, BLOOD: Lipase: 23 U/L (ref 11–51)

## 2019-08-21 LAB — URINALYSIS, MICROSCOPIC (REFLEX): RBC / HPF: NONE SEEN RBC/hpf (ref 0–5)

## 2019-08-21 LAB — CBC
HCT: 43.5 % (ref 36.0–46.0)
Hemoglobin: 14.2 g/dL (ref 12.0–15.0)
MCH: 30.5 pg (ref 26.0–34.0)
MCHC: 32.6 g/dL (ref 30.0–36.0)
MCV: 93.5 fL (ref 80.0–100.0)
Platelets: 252 10*3/uL (ref 150–400)
RBC: 4.65 MIL/uL (ref 3.87–5.11)
RDW: 12.2 % (ref 11.5–15.5)
WBC: 8 10*3/uL (ref 4.0–10.5)
nRBC: 0 % (ref 0.0–0.2)

## 2019-08-21 LAB — PREGNANCY, URINE: Preg Test, Ur: NEGATIVE

## 2019-08-21 MED ORDER — IOHEXOL 300 MG/ML  SOLN
100.0000 mL | Freq: Once | INTRAMUSCULAR | Status: AC | PRN
  Administered 2019-08-21: 100 mL via INTRAVENOUS

## 2019-08-21 NOTE — Discharge Instructions (Addendum)
You were evaluated in the Emergency Department and after careful evaluation, we did not find any emergent condition requiring admission or further testing in the hospital.  Your exam/testing today is overall reassuring.  The CT today suspect that this nodule is a soft tissue mass, which is unchanged in size and almost certainly benign.  We recommend continued use of Tylenol and/or Motrin at home for discomfort and keeping her follow-ups for MRI for further evaluation.  Please return to the Emergency Department if you experience any worsening of your condition.   Thank you for allowing [place] to be a part of your care.

## 2019-08-21 NOTE — ED Triage Notes (Signed)
Pt arrives with c/o pain to LLQ states that she is having worsening pain to this area, states that she has had a CT scan which shows a growth or mass in that area.

## 2019-08-21 NOTE — ED Provider Notes (Signed)
[facility] Emergency Department Provider Note MRN:  [id number]  Arrival date & time: 08/21/19     Chief Complaint   Abdominal Pain   History of Present Illness   Raven [name] is a 29 y.o. year-old female with no pertinent past medical history presenting to the ED with chief complaint of dental pain.  Patient explains that she has been having abdominal pain since November and her doctors thought it was a hernia.  It continues to bother her and so a CT was performed in April and a mass or hematoma was found on her rectus muscle.  She is experiencing acute worsening of her pain today, the worst it ever been.  Pain located in the left lower quadrant near the palpable nodule.  Pain is constant but worse with motion or palpation.  Denies fever, no lightheadedness, no chest pain or shortness of breath, no nausea vomiting or diarrhea.  Review of Systems  A complete 10 system review of systems was obtained and all systems are negative except as noted in the HPI and PMH.   Patient's Health History    Past Medical History:  Diagnosis Date  . Medical history non-contributory     Past Surgical History:  Procedure Laterality Date  . CESAREAN SECTION N/A 06/14/2014   Procedure: CESAREAN SECTION;  Surgeon: Bobbye Charleston, MD;  Location: [facility];  Service: Obstetrics;  Laterality: N/A;  . NO PAST SURGERIES      Family History  Problem Relation Age of Onset  . Diabetes Mother   . Hypertension Mother   . Thyroid disease Mother   . Diabetes Father   . Hypertension Father   . Hyperlipidemia Father     Social History   Socioeconomic History  . Marital status: Married    Spouse name: Not on file  . Number of children: Not on file  . Years of education: Not on file  . Highest education level: Not on file  Occupational History  . Not on file  Tobacco Use  . Smoking status: Never Smoker  . Smokeless tobacco: Never Used  Vaping Use  . Vaping Use: Never  used  Substance and Sexual Activity  . Alcohol use: No  . Drug use: No  . Sexual activity: Not on file  Other Topics Concern  . Not on file  Social History Narrative  . Not on file   Social Determinants of Health   Financial Resource Strain:   . Difficulty of Paying Living Expenses:   Food Insecurity:   . Worried About Charity fundraiser in the Last Year:   . Arboriculturist in the Last Year:   Transportation Needs:   . Film/video editor (Medical):   Marland Kitchen Lack of Transportation (Non-Medical):   Physical Activity:   . Days of Exercise per Week:   . Minutes of Exercise per Session:   Stress:   . Feeling of Stress :   Social Connections:   . Frequency of Communication with Friends and Family:   . Frequency of Social Gatherings with Friends and Family:   . Attends Religious Services:   . Active Member of Clubs or Organizations:   . Attends Archivist Meetings:   Marland Kitchen Marital Status:   Intimate Partner Violence:   . Fear of Current or Ex-Partner:   . Emotionally Abused:   Marland Kitchen Physically Abused:   . Sexually Abused:      Physical Exam   Vitals:   08/21/19  1551  BP: (!) 143/95  Pulse: 92  Resp: 16  Temp: 97.7 F (36.5 C)  SpO2: 100%    CONSTITUTIONAL: Well-appearing, NAD NEURO:  Alert and oriented x 3, no focal deficits EYES:  eyes equal and reactive ENT/NECK:  no LAD, no JVD CARDIO: Regular rate, well-perfused, normal S1 and S2 PULM:  CTAB no wheezing or rhonchi GI/GU:  normal bowel sounds, non-distended, palpable tender nodule to the left lower quadrant MSK/SPINE:  No gross deformities, no edema SKIN:  no rash, atraumatic PSYCH:  Appropriate speech and behavior  *Additional and/or pertinent findings included in MDM below  Diagnostic and Interventional Summary    EKG Interpretation  Date/Time:    Ventricular Rate:    PR Interval:    QRS Duration:   QT Interval:    QTC Calculation:   R Axis:     Text Interpretation:        Labs Reviewed   COMPREHENSIVE METABOLIC PANEL - Abnormal; Notable for the following components:      Result Value   AST 52 (*)    ALT 62 (*)    All other components within normal limits  URINALYSIS, ROUTINE W REFLEX MICROSCOPIC - Abnormal; Notable for the following components:   Leukocytes,Ua TRACE (*)    All other components within normal limits  URINALYSIS, MICROSCOPIC (REFLEX) - Abnormal; Notable for the following components:   Bacteria, UA MANY (*)    All other components within normal limits  LIPASE, BLOOD  CBC  PREGNANCY, URINE    CT ABDOMEN PELVIS W CONTRAST  Final Result      Medications  iohexol (OMNIPAQUE) 300 MG/ML solution 100 mL (100 mLs Intravenous Contrast Given 08/21/19 1713)     Procedures  /  Critical Care Procedures  ED Course and Medical Decision Making  I have reviewed the triage vital signs, the nursing notes, and pertinent available records from the EMR.  Listed above are laboratory and imaging tests that I personally ordered, reviewed, and interpreted and then considered in my medical decision making (see below for details).      CT to exclude worsening appearance of mass or active bleeding from hematoma.  Hemodynamically stable with normal vital signs, well-appearing.  Labs overall reassuring, minimally elevated LFTs, patient does not drink, suspect component of fatty liver disease.  CT is reassuring, soft tissue mass is stable, no bleeding, no signs of spread of any neoplastic process.  Patient is without pain to the right upper quadrant, I see no benefit of further imaging.  Patient will be following up with her regular doctors for MRI and they can follow-up this mild LFT elevation.  Appropriate discharge.  Barth Kirks. Sedonia Small, MD [place] [email or web address]  Final Clinical Impressions(s) / ED Diagnoses     ICD-10-CM   1. Soft tissue mass  M79.89   2. Abdominal pain, unspecified abdominal location  R10.9     ED  Discharge Orders    None       Discharge Instructions Discussed with and Provided to Patient:     Discharge Instructions     You were evaluated in the Emergency Department and after careful evaluation, we did not find any emergent condition requiring admission or further testing in the hospital.  Your exam/testing today is overall reassuring.  The CT today suspect that this nodule is a soft tissue mass, which is unchanged in size and almost certainly benign.  We recommend continued use of Tylenol and/or Motrin at  home for discomfort and keeping her follow-ups for MRI for further evaluation.  Please return to the Emergency Department if you experience any worsening of your condition.   Thank you for allowing [place] to be a part of your care.       Maudie Flakes, MD 08/21/19 1840

## 2019-08-24 ENCOUNTER — Ambulatory Visit
Admission: RE | Admit: 2019-08-24 | Discharge: 2019-08-24 | Disposition: A | Discharge status: Home / Self Care | Payer: No Typology Code available for payment source | Source: Ambulatory Visit | Attending: Surgery | Admitting: Surgery

## 2019-08-24 ENCOUNTER — Ambulatory Visit: Payer: No Typology Code available for payment source | Admitting: Psychology

## 2019-08-24 ENCOUNTER — Other Ambulatory Visit: Payer: Self-pay

## 2019-08-24 DIAGNOSIS — R1904 Left lower quadrant abdominal swelling, mass and lump: Secondary | ICD-10-CM

## 2019-08-24 MED ORDER — GADOBUTROL 1 MMOL/ML IV SOLN
10.0000 mL | Freq: Once | INTRAVENOUS | Status: AC | PRN
  Administered 2019-08-24: 10 mL via INTRAVENOUS

## 2019-08-25 ENCOUNTER — Ambulatory Visit: Payer: Self-pay | Admitting: Surgery

## 2019-08-25 NOTE — H&P (Signed)
Surgical Evaluation  HPI: very pleasant 29 year old woman with a lower abdominal wall mass.  I first saw her for this in April when she was referred for possible hernia.  A CT scan demonstrated a mass seemingly originating from the left rectus unclear whether this was a hematoma or mass.  We discussed exploration versus MRI versus surveillance with repeat imaging in 3 months..  She has had continued pain from this. It is not really cyclical but she does have an IUD in place.  Her follow-up imaging was completed yesterday and we did an MRI which is concerning for endometriosis in her C-section scar.  Allergies  Allergen Reactions  . Sulfamethoxazole-Trimethoprim Rash    Past Medical History:  Diagnosis Date  . Medical history non-contributory     Past Surgical History:  Procedure Laterality Date  . CESAREAN SECTION N/A 06/14/2014   Procedure: CESAREAN SECTION;  Surgeon: Bobbye Charleston, MD;  Location: [facility];  Service: Obstetrics;  Laterality: N/A;  . NO PAST SURGERIES      Family History  Problem Relation Age of Onset  . Diabetes Mother   . Hypertension Mother   . Thyroid disease Mother   . Diabetes Father   . Hypertension Father   . Hyperlipidemia Father     Social History   Socioeconomic History  . Marital status: Married    Spouse name: Not on file  . Number of children: Not on file  . Years of education: Not on file  . Highest education level: Not on file  Occupational History  . Not on file  Tobacco Use  . Smoking status: Never Smoker  . Smokeless tobacco: Never Used  Vaping Use  . Vaping Use: Never used  Substance and Sexual Activity  . Alcohol use: No  . Drug use: No  . Sexual activity: Not on file  Other Topics Concern  . Not on file  Social History Narrative  . Not on file   Social Determinants of Health   Financial Resource Strain:   . Difficulty of Paying Living Expenses:   Food Insecurity:   . Worried About Charity fundraiser in the Last Year:    . Arboriculturist in the Last Year:   Transportation Needs:   . Film/video editor (Medical):   Marland Kitchen Lack of Transportation (Non-Medical):   Physical Activity:   . Days of Exercise per Week:   . Minutes of Exercise per Session:   Stress:   . Feeling of Stress :   Social Connections:   . Frequency of Communication with Friends and Family:   . Frequency of Social Gatherings with Friends and Family:   . Attends Religious Services:   . Active Member of Clubs or Organizations:   . Attends Archivist Meetings:   Marland Kitchen Marital Status:     Current Outpatient Medications on File Prior to Visit  Medication Sig Dispense Refill  . levonorgestrel (MIRENA, 52 MG,) 20 MCG/24HR IUD Mirena 20 mcg/24 hours (5 yrs) 52 mg intrauterine device    . meloxicam (MOBIC) 15 MG tablet TAKE 1 TABLET BY MOUTH ONCE A DAY TAKE DAILY FOR TWO WEEKS, AND THEN ONLY AS NEEDED AFTERWARDS.    Marland Kitchen scopolamine (TRANSDERM-SCOP, 1.5 MG,) 1 MG/3DAYS Place 1 patch (1.5 mg total) onto the skin every 3 (three) days. Change every 3 days PRN motion sickness 4 patch 0   No current facility-administered medications on file prior to visit.    Review of Systems: a complete, 10pt  review of systems was completed with pertinent positives and negatives as documented in the HPI  Physical Exam:  She is alert and well-appearing. Unlabored respirations. Abdomen is soft, obese, nondistended. In the left lower quadrant several centimeters superior to the inguinal ligament there is a vaguely palpable firm mass which does protrude and seem more prominent with Valsalva. This is not in the location of a typical inguinal hernia or spigelian hernia   CBC Latest Ref Rng & Units 08/21/2019 03/23/2019 02/04/2019  WBC 4.0 - 10.5 K/uL 8.0 9.2 11.7(H)  Hemoglobin 12.0 - 15.0 g/dL 14.2 14.2 15.7(H)  Hematocrit 36 - 46 % 43.5 42.0 48.3(H)  Platelets 150 - 400 K/uL 252 253 264    CMP Latest Ref Rng & Units 08/21/2019 02/04/2019 06/25/2014  Glucose  70 - 99 mg/dL 92 103(H) 98  BUN 6 - 20 mg/dL 10 12 12   Creatinine 0.44 - 1.00 mg/dL 0.72 0.87 0.95  Sodium 135 - 145 mmol/L 138 134(L) 136  Potassium 3.5 - 5.1 mmol/L 3.6 4.2 3.8  Chloride 98 - 111 mmol/L 102 101 98(L)  CO2 22 - 32 mmol/L 26 23 26   Calcium 8.9 - 10.3 mg/dL 9.3 9.5 9.4  Total Protein 6.5 - 8.1 g/dL 8.1 - 7.5  Total Bilirubin 0.3 - 1.2 mg/dL 0.4 - 0.2(L)  Alkaline Phos 38 - 126 U/L 75 - 106  AST 15 - 41 U/L 52(H) - 16  ALT 0 - 44 U/L 62(H) - 17    No results found for: INR, PROTIME  Imaging: MR PELVIS W WO CONTRAST  Result Date: 08/24/2019 CLINICAL DATA:  Ventral left pelvic wall soft tissue mass on CT. Left lower quadrant pain for 3 months. Elevated liver function tests. History of cesarean section 06/14/2014. EXAM: MRI ABDOMEN AND PELVIS WITHOUT AND WITH CONTRAST TECHNIQUE: Multiplanar multisequence MR imaging of the abdomen and pelvis was performed both before and after the administration of intravenous contrast. CONTRAST:  82mL GADAVIST GADOBUTROL 1 MMOL/ML IV SOLN COMPARISON:  08/21/2019 CT abdomen/pelvis. FINDINGS: COMBINED FINDINGS FOR BOTH MR ABDOMEN AND PELVIS Lower chest: No acute abnormality at the lung bases. Hepatobiliary: Normal liver size and configuration. Mild diffuse hepatic steatosis. No liver mass. Normal gallbladder with no cholelithiasis. No biliary ductal dilatation. Common bile duct diameter 2 mm. No choledocholithiasis. No biliary strictures, masses or beading. Pancreas: No pancreatic mass or duct dilation.  No pancreas divisum. Spleen: Normal size. No mass. Adrenals/Urinary Tract: Normal adrenals. No hydronephrosis. Normal kidneys with no renal mass. Stomach/Bowel: Normal non-distended stomach. No dilated or thick-walled bowel loops. Vascular/Lymphatic: Normal caliber abdominal aorta. Patent portal, splenic, hepatic and renal veins. No pathologically enlarged lymph nodes in the abdomen or pelvis. Reproductive: Anteverted uterus is normal in size and  configuration and measures 9.3 x 3.3 x 4.7 cm. No uterine fibroids. Inner myometrial thickness (junctional zone) 5 mm, within normal limits. Bilayer endometrial thickness 4 mm, within normal limits. No endometrial cavity fluid or focal endometrial mass. Subcentimeter simple nabothian cyst at the external left cervical os. Otherwise normal uterine cervix. IUD is well positioned in the uterine cavity. Right ovary measures 3.4 x 2.8 x 2.0 cm. Left ovary measures 3.7 x 1.9 x 1.9 cm. No abnormal ovarian or adnexal masses. Other: No abdominal ascites or focal fluid collection. There is a poorly marginated 3.2 x 2.7 x 3.2 cm mass in the deep subcutaneous ventral left pelvic wall (series 15/image 17), intimately involving the superficial portion of ventral left pelvic muscle wall, with numerous tiny internal cystic spaces  and with avid enhancement, previously 3.1 x 2.6 cm on 06/10/2019 CT abdomen/pelvis using similar measurement technique, not appreciably changed. Musculoskeletal: No aggressive appearing focal osseous lesions. IMPRESSION: 1. Poorly marginated 3.2 x 2.7 x 3.2 cm enhancing mass in the deep subcutaneous ventral left pelvic wall, intimately involving the superficial portion of the ventral left pelvic muscle wall, with numerous tiny internal cystic spaces, not appreciably changed in size compared to 06/10/2019 CT abdomen/pelvis. Cesarean scar endometriosis is strongly favored given the history of cesarean section. 2. IUD well positioned in the uterine cavity. 3. Mild diffuse hepatic steatosis. Electronically Signed   By: Ilona Sorrel M.D.   On: 08/24/2019 14:11   MR ABDOMEN WWO CONTRAST  Result Date: 08/24/2019 CLINICAL DATA:  Ventral left pelvic wall soft tissue mass on CT. Left lower quadrant pain for 3 months. Elevated liver function tests. History of cesarean section 06/14/2014. EXAM: MRI ABDOMEN AND PELVIS WITHOUT AND WITH CONTRAST TECHNIQUE: Multiplanar multisequence MR imaging of the abdomen and  pelvis was performed both before and after the administration of intravenous contrast. CONTRAST:  3mL GADAVIST GADOBUTROL 1 MMOL/ML IV SOLN COMPARISON:  08/21/2019 CT abdomen/pelvis. FINDINGS: COMBINED FINDINGS FOR BOTH MR ABDOMEN AND PELVIS Lower chest: No acute abnormality at the lung bases. Hepatobiliary: Normal liver size and configuration. Mild diffuse hepatic steatosis. No liver mass. Normal gallbladder with no cholelithiasis. No biliary ductal dilatation. Common bile duct diameter 2 mm. No choledocholithiasis. No biliary strictures, masses or beading. Pancreas: No pancreatic mass or duct dilation.  No pancreas divisum. Spleen: Normal size. No mass. Adrenals/Urinary Tract: Normal adrenals. No hydronephrosis. Normal kidneys with no renal mass. Stomach/Bowel: Normal non-distended stomach. No dilated or thick-walled bowel loops. Vascular/Lymphatic: Normal caliber abdominal aorta. Patent portal, splenic, hepatic and renal veins. No pathologically enlarged lymph nodes in the abdomen or pelvis. Reproductive: Anteverted uterus is normal in size and configuration and measures 9.3 x 3.3 x 4.7 cm. No uterine fibroids. Inner myometrial thickness (junctional zone) 5 mm, within normal limits. Bilayer endometrial thickness 4 mm, within normal limits. No endometrial cavity fluid or focal endometrial mass. Subcentimeter simple nabothian cyst at the external left cervical os. Otherwise normal uterine cervix. IUD is well positioned in the uterine cavity. Right ovary measures 3.4 x 2.8 x 2.0 cm. Left ovary measures 3.7 x 1.9 x 1.9 cm. No abnormal ovarian or adnexal masses. Other: No abdominal ascites or focal fluid collection. There is a poorly marginated 3.2 x 2.7 x 3.2 cm mass in the deep subcutaneous ventral left pelvic wall (series 15/image 17), intimately involving the superficial portion of ventral left pelvic muscle wall, with numerous tiny internal cystic spaces and with avid enhancement, previously 3.1 x 2.6 cm on  06/10/2019 CT abdomen/pelvis using similar measurement technique, not appreciably changed. Musculoskeletal: No aggressive appearing focal osseous lesions. IMPRESSION: 1. Poorly marginated 3.2 x 2.7 x 3.2 cm enhancing mass in the deep subcutaneous ventral left pelvic wall, intimately involving the superficial portion of the ventral left pelvic muscle wall, with numerous tiny internal cystic spaces, not appreciably changed in size compared to 06/10/2019 CT abdomen/pelvis. Cesarean scar endometriosis is strongly favored given the history of cesarean section. 2. IUD well positioned in the uterine cavity. 3. Mild diffuse hepatic steatosis. Electronically Signed   By: Ilona Sorrel M.D.   On: 08/24/2019 14:11     A/P: lower abdominal wall mass, MRI concerning for endometriosis in the surgical scar.  It does not appear to have changed in size since the initial CT 3 months prior.  Given continued symptoms of pain, I recommend proceeding with excisional biopsy.  We discussed the procedure, risk of bleeding, pain, scarring, injury to surrounding structures, recurrence of the lesion, as well as general risks of anesthesia.  Questions were welcomed and answered.  She wishes to proceed with scheduling.    Patient Active Problem List   Diagnosis Date Noted  . Multiple joint pain 03/22/2019  . Bilateral hearing loss 11/01/2018  . Well adult exam 11/01/2018  . Conductive hearing loss in left ear 10/06/2018  . Anxiety and depression 09/03/2018  . Otosclerosis 09/03/2018  . Cesarean delivery delivered 06/15/2014       Romana Juniper, MD [facility]  See AMION to contact appropriate on-call provider

## 2019-08-30 ENCOUNTER — Ambulatory Visit: Payer: No Typology Code available for payment source | Admitting: Dietician

## 2019-09-09 ENCOUNTER — Other Ambulatory Visit: Payer: No Typology Code available for payment source

## 2019-09-15 NOTE — Patient Instructions (Addendum)
DUE TO COVID-19 ONLY ONE VISITOR IS ALLOWED TO COME WITH YOU AND STAY IN THE WAITING ROOM ONLY DURING PRE OP AND PROCEDURE DAY OF SURGERY. THE 2 VISITORS MAY VISIT WITH YOU AFTER SURGERY IN YOUR PRIVATE ROOM DURING VISITING HOURS ONLY!  YOU NEED TO HAVE A COVID 19 TEST ON: 10/18/19 @ 8:00 am, THIS TEST MUST BE DONE BEFORE SURGERY,  COVID TESTING SITE [place] [address], IT IS ON THE RIGHT GOING OUT [address] APPROXIMATELY  2 MINUTES PAST [employer] ON THE RIGHT. ONCE YOUR COVID TEST IS COMPLETED,  PLEASE BEGIN THE QUARANTINE INSTRUCTIONS AS OUTLINED IN YOUR HANDOUT.                Raven [name]    Your procedure is scheduled on: 10/21/19   Report to [facility] Main  Entrance   Report to admitting at: 8:30 AM     Call this number if you have problems the morning of surgery [phone]    Remember: Do not eat solid food  :After Midnight. Clear liquids from midnight until: 7:30 am   CLEAR LIQUID DIET   Foods Allowed                                                                     Foods Excluded  Coffee and tea, regular and decaf                             liquids that you cannot  Plain Jell-O any favor except red or purple                                           see through such as: Fruit ices (not with fruit pulp)                                     milk, soups, orange juice  Iced Popsicles                                    All solid food Carbonated beverages, regular and diet                                    Cranberry, grape and apple juices Sports drinks like Gatorade Lightly seasoned clear broth or consume(fat free) Sugar, honey syrup  Sample Menu Breakfast                                Lunch                                     Supper Cranberry juice  Beef broth                            Chicken broth Jell-O                                     Grape juice                           Apple  juice Coffee or tea                        Jell-O                                      Popsicle                                                Coffee or tea                        Coffee or tea  _____________________________________________________________________   BRUSH YOUR TEETH MORNING OF SURGERY AND RINSE YOUR MOUTH OUT, NO CHEWING GUM CANDY OR MINTS.  :                                You may not have any metal on your body including hair pins and              piercings  Do not wear jewelry, make-up, lotions, powders or perfumes, deodorant             Do not wear nail polish on your fingernails.  Do not shave  48 hours prior to surgery.           Do not bring valuables to the hospital. [place].  Contacts, dentures or bridgework may not be worn into surgery.  Leave suitcase in the car. After surgery it may be brought to your room.     Patients discharged the day of surgery will not be allowed to drive home. IF YOU ARE HAVING SURGERY AND GOING HOME THE SAME DAY, YOU MUST HAVE AN ADULT TO DRIVE YOU HOME AND BE WITH YOU FOR 24 HOURS. YOU MAY GO HOME BY TAXI OR UBER OR ORTHERWISE, BUT AN ADULT MUST ACCOMPANY YOU HOME AND STAY WITH YOU FOR 24 HOURS.  Name and phone number of your driver:  Special Instructions: N/A              Please read over the following fact sheets you were given: _____________________________________________________________________         [facility] - Preparing for Surgery Before surgery, you can play an important role.  Because skin is not sterile, your skin needs to be as free of germs as possible.  You can reduce the number of germs on your skin by washing with CHG (chlorahexidine gluconate) soap before surgery.  CHG is an antiseptic cleaner which kills germs and bonds  with the skin to continue killing germs even after washing. Please DO NOT use if you have an allergy to CHG or antibacterial soaps.  If your skin  becomes reddened/irritated stop using the CHG and inform your nurse when you arrive at Short Stay. Do not shave (including legs and underarms) for at least 48 hours prior to the first CHG shower.  You may shave your face/neck. Please follow these instructions carefully:  1.  Shower with CHG Soap the night before surgery and the  morning of Surgery.  2.  If you choose to wash your hair, wash your hair first as usual with your  normal  shampoo.  3.  After you shampoo, rinse your hair and body thoroughly to remove the  shampoo.                           4.  Use CHG as you would any other liquid soap.  You can apply chg directly  to the skin and wash                       Gently with a scrungie or clean washcloth.  5.  Apply the CHG Soap to your body ONLY FROM THE NECK DOWN.   Do not use on face/ open                           Wound or open sores. Avoid contact with eyes, ears mouth and genitals (private parts).                       Wash face,  Genitals (private parts) with your normal soap.             6.  Wash thoroughly, paying special attention to the area where your surgery  will be performed.  7.  Thoroughly rinse your body with warm water from the neck down.  8.  DO NOT shower/wash with your normal soap after using and rinsing off  the CHG Soap.                9.  Pat yourself dry with a clean towel.            10.  Wear clean pajamas.            11.  Place clean sheets on your bed the night of your first shower and do not  sleep with pets. Day of Surgery : Do not apply any lotions/deodorants the morning of surgery.  Please wear clean clothes to the hospital/surgery center.  FAILURE TO FOLLOW THESE INSTRUCTIONS MAY RESULT IN THE CANCELLATION OF YOUR SURGERY PATIENT SIGNATURE_________________________________  NURSE SIGNATURE__________________________________  ________________________________________________________________________

## 2019-09-16 ENCOUNTER — Encounter (HOSPITAL_COMMUNITY): Payer: Self-pay

## 2019-09-16 ENCOUNTER — Encounter (HOSPITAL_COMMUNITY)
Admission: RE | Admit: 2019-09-16 | Discharge: 2019-09-16 | Disposition: A | Discharge status: Home / Self Care | Payer: No Typology Code available for payment source | Source: Ambulatory Visit | Attending: Surgery | Admitting: Surgery

## 2019-09-16 ENCOUNTER — Other Ambulatory Visit: Payer: Self-pay

## 2019-09-16 DIAGNOSIS — Z01812 Encounter for preprocedural laboratory examination: Secondary | ICD-10-CM | POA: Diagnosis not present

## 2019-09-16 HISTORY — DX: Other specified postprocedural states: Z98.890

## 2019-09-16 HISTORY — DX: Other specified postprocedural states: R11.2

## 2019-09-16 HISTORY — DX: Other complications of anesthesia, initial encounter: T88.59XA

## 2019-09-16 HISTORY — DX: Abnormal levels of other serum enzymes: R74.8

## 2019-09-16 LAB — CBC
HCT: 43.4 % (ref 36.0–46.0)
Hemoglobin: 14.2 g/dL (ref 12.0–15.0)
MCH: 30.8 pg (ref 26.0–34.0)
MCHC: 32.7 g/dL (ref 30.0–36.0)
MCV: 94.1 fL (ref 80.0–100.0)
Platelets: 223 10*3/uL (ref 150–400)
RBC: 4.61 MIL/uL (ref 3.87–5.11)
RDW: 12.2 % (ref 11.5–15.5)
WBC: 7.9 10*3/uL (ref 4.0–10.5)
nRBC: 0 % (ref 0.0–0.2)

## 2019-09-16 NOTE — Progress Notes (Signed)
COVID Vaccine Completed:yes Date COVID Vaccine completed:05/2019 COVID vaccine manufacturer: *Pfizer    Moderna   [employer]   PCP - Dr. Adam Phenix Cardiologist -   Chest x-ray -  EKG -  Stress Test -  ECHO -  Cardiac Cath -   Sleep Study -  CPAP -   Fasting Blood Sugar -  Checks Blood Sugar _____ times a day  Blood Thinner Instructions: Aspirin Instructions: Last Dose:  Anesthesia review:   Patient denies shortness of breath, fever, cough and chest pain at PAT appointment   Patient verbalized understanding of instructions that were given to them at the PAT appointment. Patient was also instructed that they will need to review over the PAT instructions again at home before surgery.

## 2019-09-21 ENCOUNTER — Ambulatory Visit: Payer: No Typology Code available for payment source | Admitting: Psychology

## 2019-10-18 ENCOUNTER — Inpatient Hospital Stay (HOSPITAL_COMMUNITY)
Admission: RE | Admit: 2019-10-18 | Discharge: 2019-10-18 | Disposition: A | Discharge status: Home / Self Care | Payer: No Typology Code available for payment source | Source: Ambulatory Visit

## 2019-10-18 NOTE — Progress Notes (Signed)
Patient stated that she tested positive with health at work, instructed patient to contact health at work to get a copy of her results and take with her the day of the procedure

## 2019-10-20 MED ORDER — BUPIVACAINE LIPOSOME 1.3 % IJ SUSP
20.0000 mL | Freq: Once | INTRAMUSCULAR | Status: DC
  Filled 2019-10-20: qty 20

## 2019-10-20 NOTE — Anesthesia Preprocedure Evaluation (Addendum)
Anesthesia Evaluation  Patient identified by MRN, date of birth, ID band Patient awake    Reviewed: Allergy & Precautions, NPO status , Patient's Chart, lab work & pertinent test results  History of Anesthesia Complications Negative for: history of anesthetic complications  Airway Mallampati: II  TM Distance: >3 FB Neck ROM: Full    Dental no notable dental hx. (+) Dental Advisory Given   Pulmonary neg pulmonary ROS,    Pulmonary exam normal        Cardiovascular negative cardio ROS Normal cardiovascular exam     Neuro/Psych PSYCHIATRIC DISORDERS Anxiety Depression negative neurological ROS     GI/Hepatic negative GI ROS, Neg liver ROS,   Endo/Other  negative endocrine ROS  Renal/GU negative Renal ROS     Musculoskeletal negative musculoskeletal ROS (+)   Abdominal   Peds  Hematology negative hematology ROS (+)   Anesthesia Other Findings   Reproductive/Obstetrics                            Anesthesia Physical Anesthesia Plan  ASA: II  Anesthesia Plan: General   Post-op Pain Management:    Induction: Intravenous  PONV Risk Score and Plan: 4 or greater and Ondansetron, Dexamethasone, Scopolamine patch - Pre-op and Treatment may vary due to age or medical condition  Airway Management Planned: LMA and Oral ETT  Additional Equipment:   Intra-op Plan:   Post-operative Plan: Extubation in OR  Informed Consent: I have reviewed the patients History and Physical, chart, labs and discussed the procedure including the risks, benefits and alternatives for the proposed anesthesia with the patient or authorized representative who has indicated his/her understanding and acceptance.     Dental advisory given  Plan Discussed with: Anesthesiologist and CRNA  Anesthesia Plan Comments:        Anesthesia Quick Evaluation

## 2019-10-21 ENCOUNTER — Ambulatory Visit (HOSPITAL_COMMUNITY): Payer: No Typology Code available for payment source | Admitting: Certified Registered Nurse Anesthetist

## 2019-10-21 ENCOUNTER — Ambulatory Visit (HOSPITAL_COMMUNITY): Payer: No Typology Code available for payment source

## 2019-10-21 ENCOUNTER — Encounter (HOSPITAL_COMMUNITY)
Admission: RE | Disposition: A | Discharge status: Home / Self Care | Payer: Self-pay | Source: Other Acute Inpatient Hospital | Attending: Surgery

## 2019-10-21 ENCOUNTER — Ambulatory Visit (HOSPITAL_COMMUNITY)
Admission: RE | Admit: 2019-10-21 | Discharge: 2019-10-21 | Disposition: A | Discharge status: Home / Self Care | Payer: No Typology Code available for payment source | Source: Other Acute Inpatient Hospital | Attending: Surgery | Admitting: Surgery

## 2019-10-21 ENCOUNTER — Encounter (HOSPITAL_COMMUNITY): Payer: Self-pay | Admitting: Surgery

## 2019-10-21 DIAGNOSIS — Z793 Long term (current) use of hormonal contraceptives: Secondary | ICD-10-CM | POA: Insufficient documentation

## 2019-10-21 DIAGNOSIS — Z881 Allergy status to other antibiotic agents status: Secondary | ICD-10-CM | POA: Diagnosis not present

## 2019-10-21 DIAGNOSIS — N808 Other endometriosis: Secondary | ICD-10-CM | POA: Insufficient documentation

## 2019-10-21 DIAGNOSIS — Z791 Long term (current) use of non-steroidal anti-inflammatories (NSAID): Secondary | ICD-10-CM | POA: Insufficient documentation

## 2019-10-21 DIAGNOSIS — R0602 Shortness of breath: Secondary | ICD-10-CM

## 2019-10-21 DIAGNOSIS — R1900 Intra-abdominal and pelvic swelling, mass and lump, unspecified site: Secondary | ICD-10-CM | POA: Diagnosis present

## 2019-10-21 DIAGNOSIS — Z975 Presence of (intrauterine) contraceptive device: Secondary | ICD-10-CM | POA: Insufficient documentation

## 2019-10-21 HISTORY — PX: EXCISION MASS ABDOMINAL: SHX6701

## 2019-10-21 LAB — CBC
HCT: 43.3 % (ref 36.0–46.0)
Hemoglobin: 14.3 g/dL (ref 12.0–15.0)
MCH: 31 pg (ref 26.0–34.0)
MCHC: 33 g/dL (ref 30.0–36.0)
MCV: 93.9 fL (ref 80.0–100.0)
Platelets: 247 10*3/uL (ref 150–400)
RBC: 4.61 MIL/uL (ref 3.87–5.11)
RDW: 12 % (ref 11.5–15.5)
WBC: 9.4 10*3/uL (ref 4.0–10.5)
nRBC: 0 % (ref 0.0–0.2)

## 2019-10-21 LAB — PREGNANCY, URINE: Preg Test, Ur: NEGATIVE

## 2019-10-21 SURGERY — EXCISION, MASS, TORSO
Anesthesia: General | Site: Abdomen

## 2019-10-21 MED ORDER — FENTANYL CITRATE (PF) 250 MCG/5ML IJ SOLN
INTRAMUSCULAR | Status: AC
  Filled 2019-10-21: qty 5

## 2019-10-21 MED ORDER — SODIUM CHLORIDE 0.9% FLUSH: 3.0000 mL | Freq: Two times a day (BID) | INTRAVENOUS | Status: DC

## 2019-10-21 MED ORDER — LIDOCAINE 2% (20 MG/ML) 5 ML SYRINGE
INTRAMUSCULAR | Status: AC
  Filled 2019-10-21: qty 5

## 2019-10-21 MED ORDER — CHLORHEXIDINE GLUCONATE 0.12 % MT SOLN
15.0000 mL | Freq: Once | OROMUCOSAL | Status: AC
  Administered 2019-10-21: 15 mL via OROMUCOSAL

## 2019-10-21 MED ORDER — PROPOFOL 10 MG/ML IV BOLUS
INTRAVENOUS | Status: AC
  Filled 2019-10-21: qty 20

## 2019-10-21 MED ORDER — GABAPENTIN 300 MG PO CAPS
300.0000 mg | ORAL_CAPSULE | ORAL | Status: AC
  Administered 2019-10-21: 300 mg via ORAL
  Filled 2019-10-21: qty 1

## 2019-10-21 MED ORDER — BUPIVACAINE-EPINEPHRINE (PF) 0.25% -1:200000 IJ SOLN
INTRAMUSCULAR | Status: AC
  Filled 2019-10-21: qty 30

## 2019-10-21 MED ORDER — CELECOXIB 200 MG PO CAPS
200.0000 mg | ORAL_CAPSULE | Freq: Once | ORAL | Status: AC
  Administered 2019-10-21: 200 mg via ORAL
  Filled 2019-10-21: qty 1

## 2019-10-21 MED ORDER — OXYCODONE HCL 5 MG PO TABS: 5.0000 mg | ORAL_TABLET | Freq: Three times a day (TID) | ORAL | 0 refills | Status: DC | PRN

## 2019-10-21 MED ORDER — PROMETHAZINE HCL 25 MG/ML IJ SOLN: 6.2500 mg | INTRAMUSCULAR | Status: DC | PRN

## 2019-10-21 MED ORDER — ACETAMINOPHEN 650 MG RE SUPP
650.0000 mg | RECTAL | Status: DC | PRN
  Filled 2019-10-21: qty 1

## 2019-10-21 MED ORDER — CEFAZOLIN SODIUM-DEXTROSE 2-4 GM/100ML-% IV SOLN
2.0000 g | INTRAVENOUS | Status: AC
  Administered 2019-10-21: 2 g via INTRAVENOUS
  Filled 2019-10-21: qty 100

## 2019-10-21 MED ORDER — LACTATED RINGERS IV SOLN: INTRAVENOUS | Status: DC

## 2019-10-21 MED ORDER — BUPIVACAINE-EPINEPHRINE 0.25% -1:200000 IJ SOLN
INTRAMUSCULAR | Status: DC | PRN
  Administered 2019-10-21: 30 mL

## 2019-10-21 MED ORDER — ORAL CARE MOUTH RINSE: 15.0000 mL | Freq: Once | OROMUCOSAL | Status: AC

## 2019-10-21 MED ORDER — DEXAMETHASONE SODIUM PHOSPHATE 10 MG/ML IJ SOLN
INTRAMUSCULAR | Status: AC
  Filled 2019-10-21: qty 1

## 2019-10-21 MED ORDER — MIDAZOLAM HCL 2 MG/2ML IJ SOLN
INTRAMUSCULAR | Status: AC
  Filled 2019-10-21: qty 2

## 2019-10-21 MED ORDER — PANTOPRAZOLE SODIUM 40 MG PO TBEC: 40.0000 mg | DELAYED_RELEASE_TABLET | Freq: Every day | ORAL | 0 refills | Status: DC

## 2019-10-21 MED ORDER — ACETAMINOPHEN 500 MG PO TABS
1000.0000 mg | ORAL_TABLET | ORAL | Status: AC
  Administered 2019-10-21: 1000 mg via ORAL

## 2019-10-21 MED ORDER — OXYCODONE HCL 5 MG PO TABS: 5.0000 mg | ORAL_TABLET | ORAL | Status: DC | PRN

## 2019-10-21 MED ORDER — FENTANYL CITRATE (PF) 100 MCG/2ML IJ SOLN: 25.0000 ug | INTRAMUSCULAR | Status: DC | PRN

## 2019-10-21 MED ORDER — PHENYLEPHRINE 40 MCG/ML (10ML) SYRINGE FOR IV PUSH (FOR BLOOD PRESSURE SUPPORT)
PREFILLED_SYRINGE | INTRAVENOUS | Status: DC | PRN
  Administered 2019-10-21: 40 ug via INTRAVENOUS

## 2019-10-21 MED ORDER — ONDANSETRON 4 MG PO TBDP: 4.0000 mg | ORAL_TABLET | Freq: Three times a day (TID) | ORAL | 0 refills | Status: DC | PRN

## 2019-10-21 MED ORDER — MIDAZOLAM HCL 5 MG/5ML IJ SOLN
INTRAMUSCULAR | Status: DC | PRN
  Administered 2019-10-21: 2 mg via INTRAVENOUS

## 2019-10-21 MED ORDER — CHLORHEXIDINE GLUCONATE 4 % EX LIQD: 60.0000 mL | Freq: Once | CUTANEOUS | Status: DC

## 2019-10-21 MED ORDER — SODIUM CHLORIDE 0.9 % IV SOLN: 250.0000 mL | INTRAVENOUS | Status: DC | PRN

## 2019-10-21 MED ORDER — PHENYLEPHRINE HCL (PRESSORS) 10 MG/ML IV SOLN
INTRAVENOUS | Status: AC
  Filled 2019-10-21: qty 1

## 2019-10-21 MED ORDER — ONDANSETRON HCL 4 MG/2ML IJ SOLN
INTRAMUSCULAR | Status: DC | PRN
  Administered 2019-10-21: 4 mg via INTRAVENOUS

## 2019-10-21 MED ORDER — DEXAMETHASONE SODIUM PHOSPHATE 10 MG/ML IJ SOLN
INTRAMUSCULAR | Status: DC | PRN
  Administered 2019-10-21: 8 mg via INTRAVENOUS

## 2019-10-21 MED ORDER — 0.9 % SODIUM CHLORIDE (POUR BTL) OPTIME
TOPICAL | Status: DC | PRN
  Administered 2019-10-21: 1000 mL

## 2019-10-21 MED ORDER — ACETAMINOPHEN 325 MG PO TABS: 650.0000 mg | ORAL_TABLET | ORAL | Status: DC | PRN

## 2019-10-21 MED ORDER — SCOPOLAMINE 1 MG/3DAYS TD PT72
1.0000 | MEDICATED_PATCH | TRANSDERMAL | Status: DC
  Administered 2019-10-21: 1.5 mg via TRANSDERMAL
  Filled 2019-10-21: qty 1

## 2019-10-21 MED ORDER — FENTANYL CITRATE (PF) 100 MCG/2ML IJ SOLN
INTRAMUSCULAR | Status: DC | PRN
Start: 2019-10-21 — End: 2019-10-21
  Administered 2019-10-21 (×4): 50 ug via INTRAVENOUS

## 2019-10-21 MED ORDER — ACETAMINOPHEN 500 MG PO TABS
1000.0000 mg | ORAL_TABLET | Freq: Once | ORAL | Status: DC
  Filled 2019-10-21: qty 2

## 2019-10-21 MED ORDER — PROPOFOL 10 MG/ML IV BOLUS
INTRAVENOUS | Status: DC | PRN
  Administered 2019-10-21: 200 mg via INTRAVENOUS

## 2019-10-21 MED ORDER — LIDOCAINE 2% (20 MG/ML) 5 ML SYRINGE
INTRAMUSCULAR | Status: DC | PRN
  Administered 2019-10-21: 80 mg via INTRAVENOUS

## 2019-10-21 MED ORDER — ONDANSETRON HCL 4 MG/2ML IJ SOLN
INTRAMUSCULAR | Status: AC
  Filled 2019-10-21: qty 2

## 2019-10-21 MED ORDER — BUPIVACAINE LIPOSOME 1.3 % IJ SUSP
INTRAMUSCULAR | Status: DC | PRN
  Administered 2019-10-21: 20 mL

## 2019-10-21 MED ORDER — SODIUM CHLORIDE 0.9% FLUSH: 3.0000 mL | INTRAVENOUS | Status: DC | PRN

## 2019-10-21 SURGICAL SUPPLY — 36 items
ADH SKN CLS APL DERMABOND .7 (GAUZE/BANDAGES/DRESSINGS) ×1
BLADE SURG 15 STRL LF DISP TIS (BLADE) ×1 IMPLANT
BLADE SURG 15 STRL SS (BLADE) ×3
COVER SURGICAL LIGHT HANDLE (MISCELLANEOUS) ×3 IMPLANT
COVER WAND RF STERILE (DRAPES) IMPLANT
DECANTER SPIKE VIAL GLASS SM (MISCELLANEOUS) IMPLANT
DERMABOND ADVANCED (GAUZE/BANDAGES/DRESSINGS) ×2
DERMABOND ADVANCED .7 DNX12 (GAUZE/BANDAGES/DRESSINGS) IMPLANT
DRAPE LAPAROTOMY T 102X78X121 (DRAPES) ×2 IMPLANT
DRAPE LAPAROTOMY TRNSV 102X78 (DRAPES) IMPLANT
ELECT REM PT RETURN 15FT ADLT (MISCELLANEOUS) ×3 IMPLANT
GAUZE SPONGE 4X4 12PLY STRL (GAUZE/BANDAGES/DRESSINGS) ×3 IMPLANT
GLOVE BIO SURGEON STRL SZ 6 (GLOVE) ×7 IMPLANT
GLOVE BIOGEL PI IND STRL 6.5 (GLOVE) IMPLANT
GLOVE BIOGEL PI INDICATOR 6.5 (GLOVE) ×4
GLOVE INDICATOR 6.5 STRL GRN (GLOVE) ×3 IMPLANT
GLOVE SURG SS PI 7.0 STRL IVOR (GLOVE) ×2 IMPLANT
GOWN STRL REUS W/TWL LRG LVL3 (GOWN DISPOSABLE) ×7 IMPLANT
KIT BASIN OR (CUSTOM PROCEDURE TRAY) ×3 IMPLANT
KIT TURNOVER KIT A (KITS) IMPLANT
MARKER SKIN DUAL TIP RULER LAB (MISCELLANEOUS) IMPLANT
NEEDLE HYPO 22GX1.5 SAFETY (NEEDLE) ×3 IMPLANT
PACK BASIC VI WITH GOWN DISP (CUSTOM PROCEDURE TRAY) ×3 IMPLANT
PENCIL SMOKE EVACUATOR (MISCELLANEOUS) IMPLANT
SPONGE LAP 18X18 RF (DISPOSABLE) IMPLANT
STAPLER VISISTAT 35W (STAPLE) IMPLANT
SUT MNCRL AB 4-0 PS2 18 (SUTURE) ×3 IMPLANT
SUT PDS AB 1 CT1 27 (SUTURE) ×2 IMPLANT
SUT SILK 2 0 SH CR/8 (SUTURE) ×2 IMPLANT
SUT VIC AB 3-0 SH 27 (SUTURE) ×3
SUT VIC AB 3-0 SH 27XBRD (SUTURE) ×1 IMPLANT
SUT VIC AB 3-0 SH 8-18 (SUTURE) ×2 IMPLANT
SYR CONTROL 10ML LL (SYRINGE) ×3 IMPLANT
TOWEL OR 17X26 10 PK STRL BLUE (TOWEL DISPOSABLE) ×3 IMPLANT
TOWEL OR NON WOVEN STRL DISP B (DISPOSABLE) ×3 IMPLANT
YANKAUER SUCT BULB TIP 10FT TU (MISCELLANEOUS) IMPLANT

## 2019-10-21 NOTE — Transfer of Care (Signed)
Immediate Anesthesia Transfer of Care Note  Patient: Raven [name]  Procedure(s) Performed: EXCISION MASS ABDOMINAL (N/A Abdomen)  Patient Location: PACU  Anesthesia Type:General  Level of Consciousness: drowsy and patient cooperative  Airway & Oxygen Therapy: Patient Spontanous Breathing and Patient connected to face mask oxygen  Post-op Assessment: Report given to RN and Post -op Vital signs reviewed and stable  Post vital signs: Reviewed and stable  Last Vitals:  Vitals Value Taken Time  BP 141/75 10/21/19 0839  Temp    Pulse    Resp 20 10/21/19 0840  SpO2    Vitals shown include unvalidated device data.  Last Pain:  Vitals:   10/21/19 0636  TempSrc: Oral  PainSc:          Complications: No complications documented.

## 2019-10-21 NOTE — Anesthesia Procedure Notes (Signed)
Procedure Name: LMA Insertion Date/Time: 10/21/2019 7:29 AM Performed by: Montel Clock, CRNA Pre-anesthesia Checklist: Patient identified, Emergency Drugs available, Suction available, Patient being monitored and Timeout performed Patient Re-evaluated:Patient Re-evaluated prior to induction Oxygen Delivery Method: Circle system utilized Preoxygenation: Pre-oxygenation with 100% oxygen Induction Type: IV induction LMA: LMA with gastric port inserted LMA Size: 4.0 Number of attempts: 1 Dental Injury: Teeth and Oropharynx as per pre-operative assessment

## 2019-10-21 NOTE — Discharge Instructions (Signed)
GENERAL SURGERY: POST OP INSTRUCTIONS  ######################################################################  EAT Gradually transition to a high fiber diet with a fiber supplement over the next few weeks after discharge.  Start with a pureed / full liquid diet (see below)  WALK Walk an hour a day.  Control your pain to do that.    CONTROL PAIN Control pain so that you can walk, sleep, tolerate sneezing/coughing, go up/down stairs.  HAVE A BOWEL MOVEMENT DAILY Keep your bowels regular to avoid problems.  OK to try a laxative to override constipation.  OK to use an antidairrheal to slow down diarrhea.  Call if not better after 2 tries  CALL IF YOU HAVE PROBLEMS/CONCERNS Call if you are still struggling despite following these instructions. Call if you have concerns not answered by these instructions  ######################################################################    1. DIET: Follow a light bland diet & liquids the first 24 hours after arrival home, such as soup, liquids, starches, etc.  Be sure to drink plenty of fluids.  Quickly advance to a usual solid diet within a few days.  Avoid fast food or heavy meals as your are more likely to get nauseated or have irregular bowels.  A low-sugar, high-fiber diet for the rest of your life is ideal.   2. Take your usually prescribed home medications unless otherwise directed. 3. PAIN CONTROL: a. Pain is best controlled by a usual combination of three different methods TOGETHER: i. Ice/Heat ii. Over the counter pain medication iii. Prescription pain medication b. Most patients will experience some swelling and bruising around the incisions.  Ice packs or heating pads (30-60 minutes up to 6 times a day) will help. Use ice for the first few days to help decrease swelling and bruising, then switch to heat to help relax tight/sore spots and speed recovery.  Some people prefer to use ice alone, heat alone, alternating between ice & heat.   Experiment to what works for you.  Swelling and bruising can take several weeks to resolve.   c. It is helpful to take an over-the-counter pain medication regularly for the first few weeks.  Choose one of the following that works best for you: i. Naproxen (Aleve, etc)  Two 220mg  tabs twice a day  OR Ibuprofen (Advil, etc) Three 200mg  tabs four times a day (every meal & bedtime) AND ii. Acetaminophen (Tylenol, etc) 500-650mg  four times a day (every meal & bedtime) d. A  prescription for pain medication (such as oxycodone, hydrocodone, etc) should be given to you upon discharge.  Take your pain medication as prescribed.  i. If you are having problems/concerns with the prescription medicine (does not control pain, nausea, vomiting, rash, itching, etc), please call us [phone] to see if we need to switch you to a different pain medicine that will work better for you and/or control your side effect better. ii. If you need a refill on your pain medication, please contact your pharmacy.  They will contact our office to request authorization. Prescriptions will not be filled after 5 pm or on week-ends. 4. Avoid getting constipated.  Between the surgery and the pain medications, it is common to experience some constipation.  Increasing fluid intake and taking a fiber supplement (such as Metamucil, Citrucel, FiberCon, MiraLax, etc) 1-2 times a day regularly will usually help prevent this problem from occurring.  A mild laxative (prune juice, Milk of Magnesia, MiraLax, etc) should be taken according to package directions if there are no bowel movements after 48 hours.   5.  Wash / shower every day.  You may shower over the skin glue which is waterproof.  Continue to shower over incision(s) after the dressing is off. 6. Glue will flake off after about 2 weeks.  You may leave the incision open to air. You may replace a dressing/Band-Aid to cover the incision for comfort if you wish.   7. ACTIVITIES as tolerated:    a. You may resume regular (light) daily activities beginning the next day--such as daily self-care, walking, climbing stairs--gradually increasing activities as tolerated.  If you can walk 30 minutes without difficulty, it is safe to try more intense activity such as jogging, treadmill, bicycling, low-impact aerobics, swimming, etc. b. Refrain from the most intensive and strenuous activity such as sit-ups, heavy lifting, contact sports, etc  Refrain from any heavy lifting or straining until 4 weeks after surgery.   c. DO NOT PUSH THROUGH PAIN.  Let pain be your guide: If it hurts to do something, don't do it.  Pain is your body warning you to avoid that activity for another week until the pain goes down. d. You may drive when you are no longer taking prescription pain medication, you can comfortably wear a seatbelt, and you can safely maneuver your car and apply brakes. e. Dennis Bast may have sexual intercourse when it is comfortable.  8. FOLLOW UP in our office a. Please call [facility] at [phone] to set up an appointment to see your surgeon in the office for a follow-up appointment approximately 2-3 weeks after your surgery. b. Make sure that you call for this appointment the day you arrive home to insure a convenient appointment time. 9. IF YOU HAVE DISABILITY OR FAMILY LEAVE FORMS, BRING THEM TO THE OFFICE FOR PROCESSING.  DO NOT GIVE THEM TO YOUR DOCTOR.   WHEN TO CALL us [phone]: 1. Poor pain control 2. Reactions / problems with new medications (rash/itching, nausea, etc)  3. Fever over 101.5 F (38.5 C) 4. Worsening swelling or bruising 5. Continued bleeding from incision. 6. Increased pain, redness, or drainage from the incision 7. Difficulty breathing / swallowing   The clinic staff is available to answer your questions during regular business hours (8:30am-5pm).  Please don't hesitate to call and ask to speak to one of our nurses for clinical concerns.   If you have a medical  emergency, go to the nearest emergency room or call 911.  A surgeon from [facility] is always on call at the [facility], [place], [place], [place], [place]  [address] ? MAIN: [phone] ? TOLL FREE: [phone] ?  FAX [phone] [email or web address]

## 2019-10-21 NOTE — Anesthesia Postprocedure Evaluation (Signed)
Anesthesia Post Note  Patient: Raven [name]  Procedure(s) Performed: EXCISION MASS ABDOMINAL (N/A Abdomen)     Patient location during evaluation: PACU Anesthesia Type: General Level of consciousness: sedated Pain management: pain level controlled Vital Signs Assessment: post-procedure vital signs reviewed and stable Respiratory status: spontaneous breathing and respiratory function stable Cardiovascular status: stable Postop Assessment: no apparent nausea or vomiting Anesthetic complications: no   No complications documented.  Last Vitals:  Vitals:   10/21/19 0945 10/21/19 0950  BP: (!) 134/94 129/90  Pulse: (!) 111   Resp: 17 20  Temp:    SpO2: 96% 97%    Last Pain:  Vitals:   10/21/19 0950  TempSrc:   PainSc: 0-No pain                 Brooklyne Radke DANIEL

## 2019-10-21 NOTE — Op Note (Signed)
Operative Note  KOMAL STANGELO  [id number]  [id number]  10/21/2019   Surgeon: Victorino Sparrow ConnorMD  Assistant: Michaelle Birks MD  Procedure performed: excision of 5cm abdominal wall mass including fascia  Preop diagnosis: abdominal wall mass  Post-op diagnosis/intraop findings: same, involving the anterior fascia but did not seem to involve muscle tissue  Specimens: abdominal wall mass Retained items: no EBL: minimal cc Complications: none  Description of procedure: After obtaining informed consent the patient was taken to the operating room and placed supine on operating room table wheregeneral LMA anesthesia was initiated, preoperative antibiotics were administered, SCDs applied, and a formal timeout was performed.  The abdomen was prepped and draped in usual sterile fashion.  After infiltration with local (Exparel mixed with quarter percent Marcaine), a transverse incision was made along the Langer's lines in the left lower abdomen, approximately in line with the anterior superior iliac spine and about 7 to 8 cm medial to this.  The soft tissues were dissected with cautery until the mass was encountered.  This was circumferentially dissected away from the surrounding soft tissues and noted to extend into the anterior fascia, which was excised along with the specimen for spanning approximately 6 cm exposing the underlying oblique muscle fibers which did not appear to be involved with the mass.  The specimen was marked for orientation and handed off for permanent pathology.  Hemostasis was ensured in the wound.  The fascial defect was closed with a running #1 PDS.  Additional local was infiltrated in the subfascial plane.  The deeper layers were reapproximated with interrupted 3-0 Vicryls and the Scarpa's was reapproximated with interrupted 3-0 Vicryls.  The skin was then closed with deep dermal interrupted 3-0 Vicryls and running subcuticular 4-0 Monocryl.  Dermabond was applied.  The  patient was then awakened, extubated and taken to PACU in stable condition.   All counts were correct at the completion of the case.

## 2019-10-21 NOTE — H&P (Signed)
Surgical Evaluation  HPI: very pleasant 29 year old woman with a lower abdominal wall mass.  I first saw her for this in April when she was referred for possible hernia.  A CT scan demonstrated a mass seemingly originating from the left rectus unclear whether this was a hematoma or mass.  We discussed exploration versus MRI versus surveillance with repeat imaging in 3 months..  She has had continued pain from this. It is not really cyclical but she does have an IUD in place.  Her follow-up imaging was completed yesterday and we did an MRI which is concerning for endometriosis in her C-section scar.      Allergies  Allergen Reactions  . Sulfamethoxazole-Trimethoprim Rash        Past Medical History:  Diagnosis Date  . Medical history non-contributory          Past Surgical History:  Procedure Laterality Date  . CESAREAN SECTION N/A 06/14/2014   Procedure: CESAREAN SECTION;  Surgeon: Bobbye Charleston, MD;  Location: [facility];  Service: Obstetrics;  Laterality: N/A;  . NO PAST SURGERIES           Family History  Problem Relation Age of Onset  . Diabetes Mother   . Hypertension Mother   . Thyroid disease Mother   . Diabetes Father   . Hypertension Father   . Hyperlipidemia Father     Social History        Socioeconomic History  . Marital status: Married    Spouse name: Not on file  . Number of children: Not on file  . Years of education: Not on file  . Highest education level: Not on file  Occupational History  . Not on file  Tobacco Use  . Smoking status: Never Smoker  . Smokeless tobacco: Never Used  Vaping Use  . Vaping Use: Never used  Substance and Sexual Activity  . Alcohol use: No  . Drug use: No  . Sexual activity: Not on file  Other Topics Concern  . Not on file  Social History Narrative  . Not on file   Social Determinants of Health      Financial Resource Strain:   . Difficulty of Paying Living Expenses:   Food Insecurity:    . Worried About Charity fundraiser in the Last Year:   . Arboriculturist in the Last Year:   Transportation Needs:   . Film/video editor (Medical):   Marland Kitchen Lack of Transportation (Non-Medical):   Physical Activity:   . Days of Exercise per Week:   . Minutes of Exercise per Session:   Stress:   . Feeling of Stress :   Social Connections:   . Frequency of Communication with Friends and Family:   . Frequency of Social Gatherings with Friends and Family:   . Attends Religious Services:   . Active Member of Clubs or Organizations:   . Attends Archivist Meetings:   Marland Kitchen Marital Status:           Current Outpatient Medications on File Prior to Visit  Medication Sig Dispense Refill  . levonorgestrel (MIRENA, 52 MG,) 20 MCG/24HR IUD Mirena 20 mcg/24 hours (5 yrs) 52 mg intrauterine device    . meloxicam (MOBIC) 15 MG tablet TAKE 1 TABLET BY MOUTH ONCE A DAY TAKE DAILY FOR TWO WEEKS, AND THEN ONLY AS NEEDED AFTERWARDS.    Marland Kitchen scopolamine (TRANSDERM-SCOP, 1.5 MG,) 1 MG/3DAYS Place 1 patch (1.5 mg total) onto the skin every 3 (  three) days. Change every 3 days PRN motion sickness 4 patch 0   No current facility-administered medications on file prior to visit.    Review of Systems: a complete, 10pt review of systems was completed with pertinent positives and negatives as documented in the HPI  Physical Exam:  She is alert and well-appearing. Unlabored respirations. Abdomen is soft, obese, nondistended. In the left lower quadrant several centimeters superior to the inguinal ligament there is a vaguely palpable firm mass which does protrude and seem more prominent with Valsalva. This is not in the location of a typical inguinal hernia or spigelian hernia   CBC Latest Ref Rng & Units 08/21/2019 03/23/2019 02/04/2019  WBC 4.0 - 10.5 K/uL 8.0 9.2 11.7(H)  Hemoglobin 12.0 - 15.0 g/dL 14.2 14.2 15.7(H)  Hematocrit 36 - 46 % 43.5 42.0 48.3(H)  Platelets 150 - 400 K/uL 252 253  264    CMP Latest Ref Rng & Units 08/21/2019 02/04/2019 06/25/2014  Glucose 70 - 99 mg/dL 92 103(H) 98  BUN 6 - 20 mg/dL 10 12 12   Creatinine 0.44 - 1.00 mg/dL 0.72 0.87 0.95  Sodium 135 - 145 mmol/L 138 134(L) 136  Potassium 3.5 - 5.1 mmol/L 3.6 4.2 3.8  Chloride 98 - 111 mmol/L 102 101 98(L)  CO2 22 - 32 mmol/L 26 23 26   Calcium 8.9 - 10.3 mg/dL 9.3 9.5 9.4  Total Protein 6.5 - 8.1 g/dL 8.1 - 7.5  Total Bilirubin 0.3 - 1.2 mg/dL 0.4 - 0.2(L)  Alkaline Phos 38 - 126 U/L 75 - 106  AST 15 - 41 U/L 52(H) - 16  ALT 0 - 44 U/L 62(H) - 17    Recent Labs  No results found for: INR, PROTIME    Imaging:  Imaging Results (Last 48 hours)  MR PELVIS W WO CONTRAST  Result Date: 08/24/2019 CLINICAL DATA:  Ventral left pelvic wall soft tissue mass on CT. Left lower quadrant pain for 3 months. Elevated liver function tests. History of cesarean section 06/14/2014. EXAM: MRI ABDOMEN AND PELVIS WITHOUT AND WITH CONTRAST TECHNIQUE: Multiplanar multisequence MR imaging of the abdomen and pelvis was performed both before and after the administration of intravenous contrast. CONTRAST:  77mL GADAVIST GADOBUTROL 1 MMOL/ML IV SOLN COMPARISON:  08/21/2019 CT abdomen/pelvis. FINDINGS: COMBINED FINDINGS FOR BOTH MR ABDOMEN AND PELVIS Lower chest: No acute abnormality at the lung bases. Hepatobiliary: Normal liver size and configuration. Mild diffuse hepatic steatosis. No liver mass. Normal gallbladder with no cholelithiasis. No biliary ductal dilatation. Common bile duct diameter 2 mm. No choledocholithiasis. No biliary strictures, masses or beading. Pancreas: No pancreatic mass or duct dilation.  No pancreas divisum. Spleen: Normal size. No mass. Adrenals/Urinary Tract: Normal adrenals. No hydronephrosis. Normal kidneys with no renal mass. Stomach/Bowel: Normal non-distended stomach. No dilated or thick-walled bowel loops. Vascular/Lymphatic: Normal caliber abdominal aorta. Patent portal, splenic, hepatic and  renal veins. No pathologically enlarged lymph nodes in the abdomen or pelvis. Reproductive: Anteverted uterus is normal in size and configuration and measures 9.3 x 3.3 x 4.7 cm. No uterine fibroids. Inner myometrial thickness (junctional zone) 5 mm, within normal limits. Bilayer endometrial thickness 4 mm, within normal limits. No endometrial cavity fluid or focal endometrial mass. Subcentimeter simple nabothian cyst at the external left cervical os. Otherwise normal uterine cervix. IUD is well positioned in the uterine cavity. Right ovary measures 3.4 x 2.8 x 2.0 cm. Left ovary measures 3.7 x 1.9 x 1.9 cm. No abnormal ovarian or adnexal masses. Other: No abdominal ascites  or focal fluid collection. There is a poorly marginated 3.2 x 2.7 x 3.2 cm mass in the deep subcutaneous ventral left pelvic wall (series 15/image 17), intimately involving the superficial portion of ventral left pelvic muscle wall, with numerous tiny internal cystic spaces and with avid enhancement, previously 3.1 x 2.6 cm on 06/10/2019 CT abdomen/pelvis using similar measurement technique, not appreciably changed. Musculoskeletal: No aggressive appearing focal osseous lesions. IMPRESSION: 1. Poorly marginated 3.2 x 2.7 x 3.2 cm enhancing mass in the deep subcutaneous ventral left pelvic wall, intimately involving the superficial portion of the ventral left pelvic muscle wall, with numerous tiny internal cystic spaces, not appreciably changed in size compared to 06/10/2019 CT abdomen/pelvis. Cesarean scar endometriosis is strongly favored given the history of cesarean section. 2. IUD well positioned in the uterine cavity. 3. Mild diffuse hepatic steatosis. Electronically Signed   By: Ilona Sorrel M.D.   On: 08/24/2019 14:11   MR ABDOMEN WWO CONTRAST  Result Date: 08/24/2019 CLINICAL DATA:  Ventral left pelvic wall soft tissue mass on CT. Left lower quadrant pain for 3 months. Elevated liver function tests. History of cesarean section  06/14/2014. EXAM: MRI ABDOMEN AND PELVIS WITHOUT AND WITH CONTRAST TECHNIQUE: Multiplanar multisequence MR imaging of the abdomen and pelvis was performed both before and after the administration of intravenous contrast. CONTRAST:  71mL GADAVIST GADOBUTROL 1 MMOL/ML IV SOLN COMPARISON:  08/21/2019 CT abdomen/pelvis. FINDINGS: COMBINED FINDINGS FOR BOTH MR ABDOMEN AND PELVIS Lower chest: No acute abnormality at the lung bases. Hepatobiliary: Normal liver size and configuration. Mild diffuse hepatic steatosis. No liver mass. Normal gallbladder with no cholelithiasis. No biliary ductal dilatation. Common bile duct diameter 2 mm. No choledocholithiasis. No biliary strictures, masses or beading. Pancreas: No pancreatic mass or duct dilation.  No pancreas divisum. Spleen: Normal size. No mass. Adrenals/Urinary Tract: Normal adrenals. No hydronephrosis. Normal kidneys with no renal mass. Stomach/Bowel: Normal non-distended stomach. No dilated or thick-walled bowel loops. Vascular/Lymphatic: Normal caliber abdominal aorta. Patent portal, splenic, hepatic and renal veins. No pathologically enlarged lymph nodes in the abdomen or pelvis. Reproductive: Anteverted uterus is normal in size and configuration and measures 9.3 x 3.3 x 4.7 cm. No uterine fibroids. Inner myometrial thickness (junctional zone) 5 mm, within normal limits. Bilayer endometrial thickness 4 mm, within normal limits. No endometrial cavity fluid or focal endometrial mass. Subcentimeter simple nabothian cyst at the external left cervical os. Otherwise normal uterine cervix. IUD is well positioned in the uterine cavity. Right ovary measures 3.4 x 2.8 x 2.0 cm. Left ovary measures 3.7 x 1.9 x 1.9 cm. No abnormal ovarian or adnexal masses. Other: No abdominal ascites or focal fluid collection. There is a poorly marginated 3.2 x 2.7 x 3.2 cm mass in the deep subcutaneous ventral left pelvic wall (series 15/image 17), intimately involving the superficial portion of  ventral left pelvic muscle wall, with numerous tiny internal cystic spaces and with avid enhancement, previously 3.1 x 2.6 cm on 06/10/2019 CT abdomen/pelvis using similar measurement technique, not appreciably changed. Musculoskeletal: No aggressive appearing focal osseous lesions. IMPRESSION: 1. Poorly marginated 3.2 x 2.7 x 3.2 cm enhancing mass in the deep subcutaneous ventral left pelvic wall, intimately involving the superficial portion of the ventral left pelvic muscle wall, with numerous tiny internal cystic spaces, not appreciably changed in size compared to 06/10/2019 CT abdomen/pelvis. Cesarean scar endometriosis is strongly favored given the history of cesarean section. 2. IUD well positioned in the uterine cavity. 3. Mild diffuse hepatic steatosis. Electronically Signed  By: Ilona Sorrel M.D.   On: 08/24/2019 14:11      A/P: lower abdominal wall mass, MRI concerning for endometriosis in the surgical scar.  It does not appear to have changed in size since the initial CT 3 months prior.  Given continued symptoms of pain, I recommend proceeding with excisional biopsy.  We discussed the procedure, risk of bleeding, pain, scarring, injury to surrounding structures, recurrence of the lesion, as well as general risks of anesthesia.  Questions were welcomed and answered.  She wishes to proceed with scheduling.        Patient Active Problem List   Diagnosis Date Noted  . Multiple joint pain 03/22/2019  . Bilateral hearing loss 11/01/2018  . Well adult exam 11/01/2018  . Conductive hearing loss in left ear 10/06/2018  . Anxiety and depression 09/03/2018  . Otosclerosis 09/03/2018  . Cesarean delivery delivered 06/15/2014       Romana Juniper, MD [facility], [place]

## 2019-10-22 ENCOUNTER — Encounter (HOSPITAL_COMMUNITY): Payer: Self-pay | Admitting: Surgery

## 2019-10-24 LAB — SURGICAL PATHOLOGY

## 2019-12-02 DIAGNOSIS — N644 Mastodynia: Secondary | ICD-10-CM | POA: Insufficient documentation

## 2019-12-06 ENCOUNTER — Other Ambulatory Visit: Payer: Self-pay | Admitting: Obstetrics and Gynecology

## 2019-12-06 DIAGNOSIS — N644 Mastodynia: Secondary | ICD-10-CM

## 2019-12-09 ENCOUNTER — Ambulatory Visit: Payer: No Typology Code available for payment source

## 2019-12-12 ENCOUNTER — Other Ambulatory Visit: Payer: Self-pay

## 2019-12-12 ENCOUNTER — Ambulatory Visit: Admission: RE | Admit: 2019-12-12 | Payer: No Typology Code available for payment source | Source: Ambulatory Visit

## 2019-12-12 ENCOUNTER — Ambulatory Visit
Admission: RE | Admit: 2019-12-12 | Discharge: 2019-12-12 | Disposition: A | Discharge status: Home / Self Care | Payer: No Typology Code available for payment source | Source: Ambulatory Visit | Attending: Obstetrics and Gynecology | Admitting: Obstetrics and Gynecology

## 2019-12-12 DIAGNOSIS — N644 Mastodynia: Secondary | ICD-10-CM

## 2020-01-01 ENCOUNTER — Encounter (INDEPENDENT_AMBULATORY_CARE_PROVIDER_SITE_OTHER): Payer: Self-pay

## 2020-04-06 ENCOUNTER — Encounter: Payer: Self-pay | Admitting: Family Medicine

## 2020-04-06 NOTE — Telephone Encounter (Signed)
Patient needs to be seen.  I've never seen her in person.

## 2020-04-18 ENCOUNTER — Encounter: Payer: Self-pay | Admitting: Family Medicine

## 2020-04-18 ENCOUNTER — Ambulatory Visit (INDEPENDENT_AMBULATORY_CARE_PROVIDER_SITE_OTHER): Payer: No Typology Code available for payment source | Admitting: Family Medicine

## 2020-04-18 ENCOUNTER — Other Ambulatory Visit: Payer: Self-pay

## 2020-04-18 ENCOUNTER — Ambulatory Visit: Payer: No Typology Code available for payment source | Admitting: Family Medicine

## 2020-04-18 VITALS — BP 130/93 | HR 96 | Temp 97.5°F | Ht 64.0 in | Wt 232.6 lb

## 2020-04-18 DIAGNOSIS — T7840XA Allergy, unspecified, initial encounter: Secondary | ICD-10-CM | POA: Diagnosis not present

## 2020-04-18 NOTE — Patient Instructions (Signed)
Benadryl Xyzal Famotidine   Hives Hives are itchy, red, swollen areas on your skin. Hives can show up on any part of your body. Hives often fade within 24 hours (acute hives). New hives can show up after old ones fade. This can go on for many days or weeks (chronic hives). Hives do not spread from person to person (are not contagious). Hives are caused by your body's response to something that you are allergic to (allergen). These are sometimes called triggers. You can get hives right after being around a trigger, or hours later. What are the causes?  Allergies to foods.  Insect bites or stings.  Pollen.  Pets.  Latex.  Chemicals.  Spending time in sunlight, heat, or cold.  Exercise.  Stress.  Some medicines.  Viruses. This includes the common cold.  Infections caused by germs (bacteria).  Allergy shots.  Blood transfusions. Sometimes, the cause is not known. What increases the risk?  Being a woman.  Being allergic to foods such as: ? Citrus fruits. ? Milk. ? Eggs. ? Peanuts. ? Tree nuts. ? Shellfish.  Being allergic to: ? Medicines. ? Latex. ? Insects. ? Animals. ? Pollen. What are the signs or symptoms?  Raised, itchy, red or white bumps or patches on your skin. These areas may: ? Get large and swollen. ? Change in shape and location. ? Stand alone or connect to each other over a large area of skin. ? Sting or hurt. ? Turn white when pressed in the center (blanch). In very bad cases, your hands, feet, and face may also get swollen. This may happen if hives start deeper in your skin.   How is this treated? Treatment for this condition depends on your symptoms. Treatment may include:  Using cool, wet cloths (cool compresses) or taking cool showers to stop the itching.  Medicines that help: ? Relieve itching (antihistamines). ? Reduce swelling (corticosteroids). ? Treat infection (antibiotics).  A medicine (omalizumab) that is given as a shot  (injection). Your doctor may prescribe this if you have hives that do not get better even after other treatments.  In very bad cases, you may need a shot of a medicine called epinephrine to prevent a life-threatening allergic reaction (anaphylaxis). Follow these instructions at home: Medicines  Take or apply over-the-counter and prescription medicines only as told by your doctor.  If you were prescribed an antibiotic medicine, use it as told by your doctor. Do not stop using it even if you start to feel better. Skin care  Apply cool, wet cloths to the hives.  Do not scratch your skin. Do not rub your skin. General instructions  Do not take hot showers or baths. This can make itching worse.  Do not wear tight clothes.  Use sunscreen and wear clothes that cover your skin when you are outside.  Avoid any triggers that cause your hives. Keep a journal to help track what causes your hives. Write down: ? What medicines you take. ? What you eat and drink. ? What products you use on your skin.  Keep all follow-up visits as told by your doctor. This is important. Contact a doctor if:  Your symptoms are not better with medicine.  Your joints hurt or are swollen. Get help right away if:  You have a fever.  You have pain in your belly (abdomen).  Your tongue or lips are swollen.  Your eyelids are swollen.  Your chest or throat feels tight.  You have trouble breathing or  swallowing. These symptoms may be an emergency. Do not wait to see if the symptoms will go away. Get medical help right away. Call your local emergency services (911 in the [place] Do not drive yourself to the hospital. Summary  Hives are itchy, red, swollen areas on your skin.  Treatment for this condition depends on your symptoms.  Avoid things that cause your hives. Keep a journal to help track what causes your hives.  Take and apply over-the-counter and prescription medicines only as told by your  doctor.  Keep all follow-up visits as told by your doctor. This is important. This information is not intended to replace advice given to you by your health care provider. Make sure you discuss any questions you have with your health care provider. Document Revised: 03/18/2019 Document Reviewed: 08/12/2017 Elsevier Patient Education  [place].

## 2020-04-18 NOTE — Progress Notes (Signed)
   Assessment & Plan:  1. Allergic reaction, initial encounter Encouraged to use Benadryl for acute reaction, Xyzal for itching, Famotidine at the beginning to prevent reaction from progressing. Education provided on hives.  - Ambulatory referral to Allergy   Follow up plan: Return if symptoms worsen or fail to improve.  Hendricks Limes, MSN, APRN, FNP-C [facility]  Subjective:   Patient ID: Raven [name], female    DOB: 21-Jan-1991, 30 y.o.   MRN: [id number]  HPI: Raven [name] is a 30 y.o. female presenting on 04/18/2020 for Referral (Patient states she has had 3 allergic reactions in the last 2 weeks. Blisters on tongue and hives on bilateral arms. )  Patient reports in the past 3 weeks she has had 3 allergic reactions.  Two reactions occurred after drinking Optavia shakes for weight loss, the other was after spreading pine needles in her yard.  She did not take any medications to treat.  This shakes caused what she describes as a large hive in the roof of her mouth.  The pine needles caused hives on her arms.  She is now very nervous and would like allergy testing to see what all she is allergic to.   ROS: Negative unless specifically indicated above in HPI.   Relevant past medical history reviewed and updated as indicated.   Allergies and medications reviewed and updated.   Current Outpatient Medications:  .  levonorgestrel (MIRENA, 52 MG,) 20 MCG/24HR IUD, 1 each by Intrauterine route once. , Disp: , Rfl:   Allergies  Allergen Reactions  . Other     Walnuts - burns tongue   . Sulfamethoxazole-Trimethoprim Rash    Objective:   BP (!) 130/93   Pulse 96   Temp (!) 97.5 F (36.4 C) (Temporal)   Ht 5\' 4"  (1.626 m)   Wt 232 lb 9.6 oz (105.5 kg)   SpO2 98%   BMI 39.93 kg/m    Physical Exam Vitals reviewed.  Constitutional:      General: She is not in acute distress.    Appearance: Normal appearance. She is not ill-appearing,  toxic-appearing or diaphoretic.  HENT:     Head: Normocephalic and atraumatic.  Eyes:     General: No scleral icterus.       Right eye: No discharge.        Left eye: No discharge.     Conjunctiva/sclera: Conjunctivae normal.  Cardiovascular:     Rate and Rhythm: Normal rate.  Pulmonary:     Effort: Pulmonary effort is normal. No respiratory distress.  Musculoskeletal:        General: Normal range of motion.     Cervical back: Normal range of motion.  Skin:    General: Skin is warm and dry.     Capillary Refill: Capillary refill takes less than 2 seconds.  Neurological:     General: No focal deficit present.     Mental Status: She is alert and oriented to person, place, and time. Mental status is at baseline.  Psychiatric:        Mood and Affect: Mood normal.        Behavior: Behavior normal.        Thought Content: Thought content normal.        Judgment: Judgment normal.

## 2020-05-10 ENCOUNTER — Other Ambulatory Visit: Payer: Self-pay

## 2020-05-10 ENCOUNTER — Encounter: Payer: Self-pay | Admitting: Allergy

## 2020-05-10 ENCOUNTER — Ambulatory Visit (INDEPENDENT_AMBULATORY_CARE_PROVIDER_SITE_OTHER): Payer: No Typology Code available for payment source | Admitting: Allergy

## 2020-05-10 VITALS — BP 126/70 | HR 116 | Temp 96.6°F | Resp 16 | Ht 65.24 in | Wt 234.0 lb

## 2020-05-10 DIAGNOSIS — T781XXD Other adverse food reactions, not elsewhere classified, subsequent encounter: Secondary | ICD-10-CM

## 2020-05-10 NOTE — Progress Notes (Signed)
New Patient Note  RE: Raven [name] MRN: [id number] DOB: May 04, 1990 Date of Office Visit: 05/10/2020  Referring provider: Loman Brooklyn, FNP Primary care provider: [name], DO  Chief Complaint: Allergic Reaction (Roof of mouth itchy and large hive, took a couple of hours to go away.)  History of Present Illness: I had the pleasure of seeing Raven [name] for initial evaluation at the [facility] on 05/11/2020. She is a 30 y.o. female, who is referred here by [name], DO for the evaluation of allergic reaction.  Patient started a new diet with Optavia products 2 months ago. She was on her way home from work and the roof of her mouth started to feel like "ripples". It was painful and had a hive like sensation on the roof of her mouth. She had an Optavia shake about 2 hours beforehand. She has been using these products for about 1-2 weeks at that time.   The symptoms resolved by the following day without any medications.   She stopped using the Optavia products for about 5 days but then she had a bar and developed similar symptoms as above within 1 hour. Patient did not take any medications for this and symptoms resolved within a day.  Denies any changes in medications, personal care products or recent infections.   In the past patient had burning of her tongue after eating walnuts and pecans.  Past work up includes: none.  Dietary History: patient has been eating other foods including milk, eggs, peanut, sesame, shellfish, fish, soy, wheat, meats, fruits and vegetables.  Patient had chocolate since then without any issues. She doesn't think she had anything with corn.   "Optavia Essential Creamy Chocolate Shake Mix: Ingredients: Soy protein isolate, fructose, dextrin, cocoa (processed with alkali), whey protein concentrate, modified corn starch, inulin, maltodextrin, sugar, soy lecithin, salt, cellulose gum, gum arabic, steviol  glycosides, natural flavors, xanthan gum, monk fruit extract, carrageenan, vanilla extract, Bacillus coagulans GBI-30 [id number].  Vitamins & Minerals: Potassium chloride, calcium phosphate, magnesium oxide, vitamin E acetate, ascorbic acid (vitamin C), ferric orthophosphate (iron), niacinamide (vitamin B3), biotin, vitamin A palmitate, calcium pantothenate (vitamin B5), potassium iodide, zinc oxide, cholecalciferol (vitamin D3), chromium chloride, cyanocobalamin (vitamin B12), phytonadione (vitamin K1), thiamine mononitrate (vitamin B1), sodium selenite, pyridoxine hydrochloride (vitamin B6), sodium molybdate, folic acid, manganese sulfate, riboflavin (vitamin B2), copper sulfate.  CONTAINS: MILK AND SOY.  Essential Peanut Butter & Chocolate Chip Bar:  Ingredients: Soy protein isolate, polydextrose, soluble corn fiber, glycerine, whey protein isolate, sugar, milk protein isolate, peanut flour, water, fructose, peanuts, palm kernel oil, natural flavor, chocolate liquor, cocoa (processed with alkali), hydrogenated palm oil, soy lecithin, salt, whey powder, nonfat milk, sea salt, cocoa butter, Bacillus coagulans GBI-30 [id number], steviol glycosides.  Vitamins & Minerals: Dipotassium phosphate, calcium carbonate, magnesium oxide, ascorbic acid (vitamin C), ferrous sulfate (iron), vitamin E acetate, niacinamide (vitamin B3), zinc sulfate, cholecalciferol (vitamin D3), vitamin A palmitate, phytonadione (vitamin K1), manganese sulfate, thiamine mononitrate (vitamin B1), calcium pantothenate (vitamin B5), pyridoxine hydrochloride (vitamin B6), sodium selenite, sodium molybdate, biotin, riboflavin (vitamin B2), cyanocobalamin (vitamin B12), chromium chloride, copper sulfate, potassium iodide, folic acid.  CONTAINS: MILK, PEANUTS AND SOY.  CONTAINS BIOENGINEERED FOOD INGREDIENTS."  04/18/2020 PCP visit: "Raven [name] is a 30 y.o. female presenting on 04/18/2020 for Referral (Patient states she has had 3 allergic  reactions in the last 2 weeks. Blisters on tongue and hives on bilateral arms. )  Patient reports in the  past 3 weeks she has had 3 allergic reactions.  Two reactions occurred after drinking Optavia shakes for weight loss, the other was after spreading pine needles in her yard.  She did not take any medications to treat.  This shakes caused what she describes as a large hive in the roof of her mouth.  The pine needles caused hives on her arms.  She is now very nervous and would like allergy testing to see what all she is allergic to."  Assessment and Plan: Emilianna is a 30 y.o. female with: Adverse reaction to food, subsequent encounter 2 episodes of discomfort on the roof of her mouth after consuming Optavia products. Symptoms resolved within 1 day with no medical intervention. denies any other associated symptoms. History of tongue burning after eating walnuts/peccans in the past.   Today's skin testing showed: Negative to select foods - results given.  I'm not sure what exact ingredient caused your symptoms.  Continue to avoid Optavia products and tree nuts for now.   If you have another episode, write down what you had eaten/come across that day.  For mild symptoms you can take over the counter antihistamines such as Benadryl and monitor symptoms closely. If symptoms worsen or if you have severe symptoms including breathing issues, throat closure, significant swelling, whole body hives, severe diarrhea and vomiting, lightheadedness then seek immediate medical care. Get bloodwork as below to double check some of the negative skin testing results and added on a few other food allergens that we don't have skin prick testing available in the office.   Return if symptoms worsen or fail to improve.  No orders of the defined types were placed in this encounter.   Lab Orders     Allergen Gum Carageenan     IgE Nut Prof. w/Component Rflx     Soybean IgE     Xanthan Gum IgE     Corn  IgE  Other allergy screening: Asthma: no Rhino conjunctivitis: no Medication allergy: yes Hymenoptera allergy: no Urticaria: no Eczema:no History of recurrent infections suggestive of immunodeficency: no  Diagnostics: Skin Testing: Select foods. Negative test to: select foods.  Results discussed with patient/family.  Food Adult Perc - 05/10/20 1000    Time Antigen Placed 1020    Allergen Manufacturer Lavella Hammock    Location Back    Number of allergen test 22     Control-buffer 50% Glycerol Negative    Control-Histamine 1 mg/ml 2+    1. Peanut Negative    2. Soybean Negative    3. Wheat Negative    4. Sesame Negative    5. Milk, cow Negative    6. Egg White, Chicken Negative    7. Casein Negative    8. Shellfish Mix Negative    9. Fish Mix Negative    10. Cashew Negative    11. Pecan Food Negative    12. [place] Negative    13. Almond Negative    14. Hazelnut Negative    15. [place] nut Negative    16. Coconut Negative    17. Pistachio Negative    53. Corn Negative    64. Chocolate/Cacao bean Negative    66. Acacia (Arabic Gum) Negative           Past Medical History: Patient Active Problem List   Diagnosis Date Noted  . Adverse reaction to food, subsequent encounter 05/11/2020  . Multiple joint pain 03/22/2019  . Bilateral hearing loss 11/01/2018  . Well adult exam  11/01/2018  . Conductive hearing loss in left ear 10/06/2018  . Anxiety and depression 09/03/2018  . Otosclerosis 09/03/2018  . Cesarean delivery delivered 06/15/2014   Past Medical History:  Diagnosis Date  . Complication of anesthesia   . Elevated liver enzymes   . Medical history non-contributory   . PONV (postoperative nausea and vomiting)    Past Surgical History: Past Surgical History:  Procedure Laterality Date  . CESAREAN SECTION N/A 06/14/2014   Procedure: CESAREAN SECTION;  Surgeon: Bobbye Charleston, MD;  Location: [facility];  Service: Obstetrics;  Laterality: N/A;  . EXCISION MASS  ABDOMINAL N/A 10/21/2019   Procedure: EXCISION MASS ABDOMINAL;  Surgeon: Clovis Riley, MD;  Location: [facility];  Service: General;  Laterality: N/A;  . stebactomy     Medication List:  Current Outpatient Medications  Medication Sig Dispense Refill  . levonorgestrel (MIRENA, 52 MG,) 20 MCG/24HR IUD 1 each by Intrauterine route once.      No current facility-administered medications for this visit.   Allergies: Allergies  Allergen Reactions  . Other     Walnuts - burns tongue   . Sulfamethoxazole-Trimethoprim Rash   Social History: Social History   Socioeconomic History  . Marital status: Married    Spouse name: Not on file  . Number of children: Not on file  . Years of education: Not on file  . Highest education level: Not on file  Occupational History  . Not on file  Tobacco Use  . Smoking status: Never Smoker  . Smokeless tobacco: Never Used  Vaping Use  . Vaping Use: Never used  Substance and Sexual Activity  . Alcohol use: No  . Drug use: No  . Sexual activity: Not on file  Other Topics Concern  . Not on file  Social History Narrative  . Not on file   Social Determinants of Health   Financial Resource Strain: Not on file  Food Insecurity: Not on file  Transportation Needs: Not on file  Physical Activity: Not on file  Stress: Not on file  Social Connections: Not on file   Lives in a 31 year old house. 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 Diabetes Mother   . Hypertension Mother   . Thyroid disease Mother   . Diabetes Father   . Hypertension Father   . Hyperlipidemia Father   . Asthma Paternal Aunt   . Asthma Paternal Grandmother   . Allergic rhinitis Neg Hx   . Eczema Neg Hx   . Urticaria Neg Hx     Review of Systems  Constitutional: Negative for appetite change, chills, fever and unexpected weight change.  HENT: Negative for congestion and rhinorrhea.   Eyes: Negative for itching.  Respiratory: Negative for cough, chest tightness, shortness of breath and wheezing.   Cardiovascular: Negative for chest pain.  Gastrointestinal: Negative for abdominal pain.  Genitourinary: Negative for difficulty urinating.  Skin: Negative for rash.  Neurological: Negative for headaches.   Objective: BP 126/70 (BP Location: Left Arm, Patient Position: Sitting, Cuff Size: Normal)   Pulse (!) 116   Temp (!) 96.6 F (35.9 C) (Temporal)   Resp 16   Ht 5' 5.24" (1.657 m)   Wt 234 lb (106.1 kg)   SpO2 99%  BMI 38.66 kg/m  Body mass index is 38.66 kg/m. Physical Exam Vitals and nursing note reviewed.  Constitutional:      Appearance: Normal appearance. She is well-developed.  HENT:     Head: Normocephalic and atraumatic.     Right Ear: External ear normal.     Left Ear: External ear normal.     Nose: Nose normal.     Mouth/Throat:     Mouth: Mucous membranes are moist.     Pharynx: Oropharynx is clear.  Eyes:     Conjunctiva/sclera: Conjunctivae normal.  Cardiovascular:     Rate and Rhythm: Normal rate and regular rhythm.     Heart sounds: Normal heart sounds. No murmur heard. No friction rub. No gallop.   Pulmonary:     Effort: Pulmonary effort is normal.     Breath sounds: Normal breath sounds. No wheezing, rhonchi or rales.  Abdominal:     Palpations: Abdomen is soft.  Musculoskeletal:     Cervical back: Neck supple.  Skin:    General: Skin is warm.     Findings: No rash.  Neurological:     Mental Status: She is alert and oriented to person, place, and time.  Psychiatric:        Behavior: Behavior normal.    The plan was reviewed with the patient/family, and all questions/concerned were addressed.  It was my pleasure to see Sidnie today and participate in her care.  Please feel free to contact me with any questions or concerns.  Sincerely,  Rexene Alberts, DO Allergy & Immunology  [facility] office: [place] office: [phone]

## 2020-05-10 NOTE — Patient Instructions (Addendum)
Today's skin testing showed: Negative to select foods - results given.   I'm not sure what exact ingredient caused your symptoms.  Continue to avoid Optavia products and tree nuts.  If you have another episode, write down what you had eaten/come across that day.   For mild symptoms you can take over the counter antihistamines such as Benadryl and monitor symptoms closely. If symptoms worsen or if you have severe symptoms including breathing issues, throat closure, significant swelling, whole body hives, severe diarrhea and vomiting, lightheadedness then seek immediate medical care.  Get bloodwork:  We are ordering labs, so please allow 1-2 weeks for the results to come back. With the newly implemented Cures Act, the labs might be visible to you at the same time that they become visible to me. However, I will not address the results until all of the results are back, so please be patient.  In the meantime, continue recommendations in your patient instructions, including avoidance measures (if applicable), until you hear from me.  Follow up if needed.

## 2020-05-11 ENCOUNTER — Encounter: Payer: Self-pay | Admitting: Allergy

## 2020-05-11 DIAGNOSIS — T781XXD Other adverse food reactions, not elsewhere classified, subsequent encounter: Secondary | ICD-10-CM | POA: Insufficient documentation

## 2020-05-11 NOTE — Assessment & Plan Note (Signed)
2 episodes of discomfort on the roof of her mouth after consuming Optavia products. Symptoms resolved within 1 day with no medical intervention. denies any other associated symptoms. History of tongue burning after eating walnuts/peccans in the past.   Today's skin testing showed: Negative to select foods - results given.  I'm not sure what exact ingredient caused your symptoms.  Continue to avoid Optavia products and tree nuts for now.   If you have another episode, write down what you had eaten/come across that day.  For mild symptoms you can take over the counter antihistamines such as Benadryl and monitor symptoms closely. If symptoms worsen or if you have severe symptoms including breathing issues, throat closure, significant swelling, whole body hives, severe diarrhea and vomiting, lightheadedness then seek immediate medical care. Get bloodwork as below to double check some of the negative skin testing results and added on a few other food allergens that we don't have skin prick testing available in the office.

## 2020-06-06 ENCOUNTER — Other Ambulatory Visit: Payer: Self-pay

## 2020-06-06 ENCOUNTER — Ambulatory Visit (INDEPENDENT_AMBULATORY_CARE_PROVIDER_SITE_OTHER): Payer: No Typology Code available for payment source | Admitting: Family Medicine

## 2020-06-06 ENCOUNTER — Encounter: Payer: Self-pay | Admitting: Family Medicine

## 2020-06-06 VITALS — BP 128/80 | HR 105 | Temp 98.1°F | Ht 64.0 in | Wt 235.1 lb

## 2020-06-06 DIAGNOSIS — M79644 Pain in right finger(s): Secondary | ICD-10-CM

## 2020-06-06 DIAGNOSIS — R768 Other specified abnormal immunological findings in serum: Secondary | ICD-10-CM

## 2020-06-06 MED ORDER — MELOXICAM 15 MG PO TABS: 15.0000 mg | ORAL_TABLET | Freq: Every day | ORAL | 0 refills | Status: DC

## 2020-06-06 MED ORDER — PREDNISONE 10 MG (21) PO TBPK: ORAL_TABLET | ORAL | 0 refills | Status: DC

## 2020-06-06 NOTE — Patient Instructions (Signed)
Joint Pain Joint pain may be caused by many things. Joint pain is likely to go away when you follow instructions from your health care provider for relieving pain at home. However, joint pain can also be caused by conditions that require more treatment. Common causes of joint pain include:  Bruising in the area of the joint.  Injury caused by repeating certain movements too many times.  Age-related joint wear and tear.  Buildup of uric acid crystals in the joint (gout).  Inflammation of the joint.  Other forms of arthritis.  Infections of the joint or of the bone. Your health care provider may recommend that you take pain medicine or wear a supportive device like an elastic bandage, sling, or splint. If your joint pain continues, you may need lab or imaging tests to diagnose the cause of your joint pain. Follow these instructions at home: Managing pain, stiffness, and swelling  If directed, put ice on the painful area. To do this: ? If you have a removable elastic bandage, sling, or splint, take it off as told by your doctor. ? Put ice in a plastic bag. ? Place a towel between your skin and the bag. ? Leave the ice on for 20 minutes, 2-3 times a day. ? Remove the ice if your skin turns bright red. This is very important. If you cannot feel pain, heat, or cold, you have a greater risk of damage to the area.  Move your fingers and toes often to reduce stiffness and swelling.  Raise the injured area above the level of your heart while you are sitting or lying down. If directed, apply heat to the painful area as often as told by your health care provider. Use the heat source that your health care provider recommends, such as a moist heat pack or a heating pad.  Place a towel between your skin and the heat source.  Leave the heat on for 20-30 minutes.  Remove the heat if your skin turns bright red. This is especially important if you are unable to feel pain, heat, or cold. You have a  greater risk of getting burned.      Activity  Rest as told by your health care provider. Do not do anything that causes or worsens pain.  Begin exercising or stretching the affected area as told by your health care provider.  Return to your normal activities as told by your health care provider. Ask your health care provider what activities are safe for you. If you have an elastic bandage, sling, or splint:  Wear the bandage, sling, or splint as told by your health care provider. Remove it only as told by your health care provider.  Loosen it if your fingers or toes below the joint tingle, become numb, or turn cold and blue.  Keep it clean.  Ask your health care provider if you should remove it before bathing.  If the bandage, sling, or splint is not waterproof: ? Do not let it get wet. ? Cover it with a watertight covering when you take a bath or shower. General instructions  Treatment may include medicines for pain and inflammation that are taken by mouth or applied to the skin. Take over-the-counter and prescription medicines only as told by your health care provider.  Do not use any products that contain nicotine or tobacco, such as cigarettes, e-cigarettes, and chewing tobacco. If you need help quitting, ask your health care provider.  Keep all follow-up visits. This is important.  Contact a health care provider if:  You have pain that gets worse and does not get better with medicine.  Your joint pain does not improve within 3 days.  You have increased bruising or swelling.  You have a fever.  You lose 10 lb (4.5 kg) or more without trying. Get help right away if:  You cannot move the joint.  Your fingers or toes tingle, become numb, or turn cold and blue.  You have a fever along with a joint that is red, warm, and swollen. Summary  Joint pain may be caused by many things.  Your health care provider may recommend that you take pain medicine or wear a  supportive device such as an elastic bandage, sling, or splint.  If your joint pain continues, you may need tests to diagnose the cause of your joint pain.  Take over-the-counter and prescription medicines only as told by your health care provider. This information is not intended to replace advice given to you by your health care provider. Make sure you discuss any questions you have with your health care provider. Document Revised: 05/11/2019 Document Reviewed: 05/11/2019 Elsevier Patient Education  2021 [employer].

## 2020-06-06 NOTE — Progress Notes (Signed)
Acute Office Visit  Subjective:    Patient ID: Raven [name], female    DOB: [birth year], 30 y.o.   MRN: [id number]  Chief Complaint  Patient presents with  . right thumb pain    HPI Patient is in today for right thumb pain and swelling x 13 days. The pain is moderate. The pain is worse in the morning and is also stiff in the morning. It is also worse at night. She has been taking advil and tylenol with some improvement. She has been diagnosed with a type of arthritis by rheumatology. It has been over a year since her last visit with rheumatology and she was told that she would need to be seen by her PCP or at urgent care for this flare up. She had had a positive ANA. She reports that her joint pain has responded well to prednisone in the past. She would like a new referral to rheumatology regarding her arthritis and positive ANA.   Past Medical History:  Diagnosis Date  . Complication of anesthesia   . Elevated liver enzymes   . Medical history non-contributory   . PONV (postoperative nausea and vomiting)     Past Surgical History:  Procedure Laterality Date  . CESAREAN SECTION N/A 06/14/2014   Procedure: CESAREAN SECTION;  Surgeon: Bobbye Charleston, MD;  Location: [facility];  Service: Obstetrics;  Laterality: N/A;  . EXCISION MASS ABDOMINAL N/A 10/21/2019   Procedure: EXCISION MASS ABDOMINAL;  Surgeon: Clovis Riley, MD;  Location: [facility];  Service: General;  Laterality: N/A;  . stebactomy      Family History  Problem Relation Age of Onset  . Diabetes Mother   . Hypertension Mother   . Thyroid disease Mother   . Diabetes Father   . Hypertension Father   . Hyperlipidemia Father   . Asthma Paternal Aunt   . Asthma Paternal Grandmother   . Allergic rhinitis Neg Hx   . Eczema Neg Hx   . Urticaria Neg Hx     Social History   Socioeconomic History  . Marital status: Married    Spouse name: Not on file  . Number of children: Not on file  . Years of education: Not  on file  . Highest education level: Not on file  Occupational History  . Not on file  Tobacco Use  . Smoking status: Never Smoker  . Smokeless tobacco: Never Used  Vaping Use  . Vaping Use: Never used  Substance and Sexual Activity  . Alcohol use: No  . Drug use: No  . Sexual activity: Not on file  Other Topics Concern  . Not on file  Social History Narrative  . Not on file   Social Determinants of Health   Financial Resource Strain: Not on file  Food Insecurity: Not on file  Transportation Needs: Not on file  Physical Activity: Not on file  Stress: Not on file  Social Connections: Not on file  Intimate Partner Violence: Not on file    Outpatient Medications Prior to Visit  Medication Sig Dispense Refill  . levonorgestrel (MIRENA, 52 MG,) 20 MCG/24HR IUD 1 each by Intrauterine route once.      No facility-administered medications prior to visit.    Allergies  Allergen Reactions  . Other     Walnuts - burns tongue   . Sulfamethoxazole-Trimethoprim Rash    Review of Systems As per HPI.     Objective:    Physical Exam Vitals and nursing note reviewed.  Constitutional:      General: She is not in acute distress.    Appearance: Normal appearance. She is not ill-appearing, toxic-appearing or diaphoretic.  Musculoskeletal:     Right hand: Swelling (right thumb) and tenderness (base of right thumb) present. Decreased strength (grip strength). Normal sensation. Normal capillary refill. Normal pulse.  Skin:    General: Skin is warm and dry.     Findings: No erythema.  Neurological:     General: No focal deficit present.     Mental Status: She is alert and oriented to person, place, and time.  Psychiatric:        Mood and Affect: Mood normal.        Behavior: Behavior normal.     BP 128/80   Pulse (!) 105   Temp 98.1 F (36.7 C) (Temporal)   Ht 5\' 4"  (1.626 m)   Wt 235 lb 2 oz (106.7 kg)   BMI 40.36 kg/m  Wt Readings from Last 3 Encounters:  06/06/20  235 lb 2 oz (106.7 kg)  05/10/20 234 lb (106.1 kg)  04/18/20 232 lb 9.6 oz (105.5 kg)    Health Maintenance Due  Topic Date Due  . Hepatitis C Screening  Never done  . COVID-19 Vaccine (1) Never done  . PAP SMEAR-Modifier  04/06/2020    There are no preventive care reminders to display for this patient.   Lab Results  Component Value Date   TSH 1.780 09/23/2016   Lab Results  Component Value Date   WBC 9.4 10/21/2019   HGB 14.3 10/21/2019   HCT 43.3 10/21/2019   MCV 93.9 10/21/2019   PLT 247 10/21/2019   Lab Results  Component Value Date   NA 138 08/21/2019   K 3.6 08/21/2019   CO2 26 08/21/2019   GLUCOSE 92 08/21/2019   BUN 10 08/21/2019   CREATININE 0.72 08/21/2019   BILITOT 0.4 08/21/2019   ALKPHOS 75 08/21/2019   AST 52 (H) 08/21/2019   ALT 62 (H) 08/21/2019   PROT 8.1 08/21/2019   ALBUMIN 4.4 08/21/2019   CALCIUM 9.3 08/21/2019   ANIONGAP 10 08/21/2019   No results found for: CHOL No results found for: HDL No results found for: LDLCALC No results found for: TRIG No results found for: CHOLHDL No results found for: HGBA1C     Assessment & Plan:   Raven [name] was seen today for right thumb pain.  Diagnoses and all orders for this visit:  Pain of right thumb Mobic and steroid taper as below. Do not take other NSAIDs with mobic.  -     meloxicam (MOBIC) 15 MG tablet; Take 1 tablet (15 mg total) by mouth daily. -     predniSONE (STERAPRED UNI-PAK 21 TAB) 10 MG (21) TBPK tablet; Use as directed on back of pill pack  Positive ANA (antinuclear antibody) New referral placed.  -     Ambulatory referral to Rheumatology  Return to office for new or worsening symptoms, or if symptoms persist.   The patient indicates understanding of these issues and agrees with the plan.    Gwenlyn Perking, FNP

## 2020-06-11 ENCOUNTER — Telehealth: Payer: Self-pay

## 2020-06-11 NOTE — Telephone Encounter (Signed)
Pt is aware of provider recommendations and voiced understanding. She will call back tomorrow to schedule an appt.

## 2020-06-11 NOTE — Telephone Encounter (Signed)
  Incoming Patient Call  06/11/2020  What symptoms do you have? Finger is worsening with pain last 3 days, swelling and warm at times  How long have you been sick?  Saw Tiffany 06/06/20   Have you been seen for this problem? Yes 4/27  If your provider decides to give you a prescription, which pharmacy would you like for it to be sent to?   Patient informed that this information will be sent to the clinical staff for review and that they should receive a follow up call.

## 2020-06-11 NOTE — Telephone Encounter (Signed)
Patient was prescribed steroid dose pack at visit. If symptoms have worsened, she should follow up in office.

## 2020-06-12 ENCOUNTER — Ambulatory Visit (INDEPENDENT_AMBULATORY_CARE_PROVIDER_SITE_OTHER): Payer: No Typology Code available for payment source | Admitting: Nurse Practitioner

## 2020-06-12 ENCOUNTER — Encounter: Payer: Self-pay | Admitting: Nurse Practitioner

## 2020-06-12 ENCOUNTER — Other Ambulatory Visit: Payer: Self-pay

## 2020-06-12 VITALS — BP 123/87 | HR 91 | Temp 97.7°F | Ht 64.0 in | Wt 235.0 lb

## 2020-06-12 DIAGNOSIS — M79644 Pain in right finger(s): Secondary | ICD-10-CM | POA: Diagnosis not present

## 2020-06-12 MED ORDER — NAPROXEN 500 MG PO TABS: 500.0000 mg | ORAL_TABLET | Freq: Two times a day (BID) | ORAL | 0 refills | Status: DC

## 2020-06-12 NOTE — Patient Instructions (Signed)
Finger Sprain, Adult A finger sprain is a tear or stretch in a ligament in your finger. Ligaments are tissues that connect bones to each other. Follow these instructions at home: If you have a splint:  Do not put pressure on any part of the splint until it is fully hardened. This may take many hours.  Wear the splint as told by your doctor. Take it off only as told by your doctor.  Loosen the splint if your fingers tingle, lose feeling (get numb), or turn cold and blue.  Keep the splint clean.  If the splint is not waterproof: ? Do not let it get wet. ? Cover it with a watertight covering when you take a bath or a shower.   If you have a cast:  Do not put pressure on any part of the cast until it is fully hardened. This may take many hours.  Do not stick anything inside the cast to scratch your skin.  Check the skin around the cast every day. Tell your doctor about any concerns.  You may put lotion on dry skin around the edges of the cast. Do not put lotion on the skin under the cast.  Keep the cast clean.  If the cast is not waterproof: ? Do not let it get wet. ? Cover it with a watertight covering when you take a bath or a shower. Managing pain, stiffness, and swelling  If directed, put ice on the injured area: ? If you have a removable splint, take it off as told by your doctor. ? Put ice in a plastic bag. ? Place a towel between your skin and the bag or between your cast and the bag. ? Leave the ice on for 20 minutes, 2-3 times a day.  Gently move your fingers often to avoid stiffness and to lessen swelling.  Raise (elevate) the injured area above the level of your heart while you are sitting or lying down. Medicines  Take over-the-counter and prescription medicines only as told by your doctor.  Do not drive or use heavy machinery while taking prescription pain medicine. General instructions  Keep any bandages (dressings) dry until your doctor says they can be  taken off.  Do exercises as told by your doctor or physical therapist.  Do not wear rings on your injured finger.  Keep all follow-up visits as told by your doctor. This is important. Get help right away if:  Your pain is not helped by medicine.  Your bruising or swelling gets worse.  Your splint or cast is damaged.  You lose feeling in your finger.  Your finger turns blue.  Your finger feels colder than normal when you touch it.  You have a fever. Summary  A finger sprain is a tear or stretch in a ligament in your finger. Ligaments are tissues that connect bones to each other.  If you have a splint, loosen the splint if your fingers tingle, lose feeling (get numb), or turn cold and blue.  Gently move your fingers often to avoid stiffness and to lessen swelling.  If directed, put ice on the injured area. This information is not intended to replace advice given to you by your health care provider. Make sure you discuss any questions you have with your health care provider. Document Revised: 01/09/2017 Document Reviewed: 04/18/2016 Elsevier Patient Education  2021 Elsevier Inc.  

## 2020-06-12 NOTE — Progress Notes (Signed)
Acute Office Visit  Subjective:    Patient ID: Raven [name], female    DOB: 17-Feb-1990, 30 y.o.   MRN: [id number]  Chief Complaint  Patient presents with  . thumb pain    HPI Patient is in today for Pain  She reports recurrent right thumb pain. was not an injury that may have caused the pain. The pain started few months ago and is staying constant. The pain does not radiate . The pain is described as aching and soreness, is moderate in intensity, occurring constantly. Aggravating factors: none Relieving factors: none.  She has tried NSAIDs with little relief.   ---------------------------------------------------------------------------------------------------   Past Medical History:  Diagnosis Date  . Complication of anesthesia   . Elevated liver enzymes   . Medical history non-contributory   . PONV (postoperative nausea and vomiting)     Past Surgical History:  Procedure Laterality Date  . CESAREAN SECTION N/A 06/14/2014   Procedure: CESAREAN SECTION;  Surgeon: Bobbye Charleston, MD;  Location: [facility];  Service: Obstetrics;  Laterality: N/A;  . EXCISION MASS ABDOMINAL N/A 10/21/2019   Procedure: EXCISION MASS ABDOMINAL;  Surgeon: Clovis Riley, MD;  Location: [facility];  Service: General;  Laterality: N/A;  . stebactomy      Family History  Problem Relation Age of Onset  . Diabetes Mother   . Hypertension Mother   . Thyroid disease Mother   . Diabetes Father   . Hypertension Father   . Hyperlipidemia Father   . Asthma Paternal Aunt   . Asthma Paternal Grandmother   . Allergic rhinitis Neg Hx   . Eczema Neg Hx   . Urticaria Neg Hx     Social History   Socioeconomic History  . Marital status: Married    Spouse name: Not on file  . Number of children: Not on file  . Years of education: Not on file  . Highest education level: Not on file  Occupational History  . Not on file  Tobacco Use  . Smoking status: Never Smoker  . Smokeless tobacco: Never  Used  Vaping Use  . Vaping Use: Never used  Substance and Sexual Activity  . Alcohol use: No  . Drug use: No  . Sexual activity: Not on file  Other Topics Concern  . Not on file  Social History Narrative  . Not on file   Social Determinants of Health   Financial Resource Strain: Not on file  Food Insecurity: Not on file  Transportation Needs: Not on file  Physical Activity: Not on file  Stress: Not on file  Social Connections: Not on file  Intimate Partner Violence: Not on file    Outpatient Medications Prior to Visit  Medication Sig Dispense Refill  . levonorgestrel (MIRENA, 52 MG,) 20 MCG/24HR IUD 1 each by Intrauterine route once.     . meloxicam (MOBIC) 15 MG tablet Take 1 tablet (15 mg total) by mouth daily. 30 tablet 0  . predniSONE (STERAPRED UNI-PAK 21 TAB) 10 MG (21) TBPK tablet Use as directed on back of pill pack 21 tablet 0   No facility-administered medications prior to visit.    Allergies  Allergen Reactions  . Other     Walnuts - burns tongue   . Sulfamethoxazole-Trimethoprim Rash    Review of Systems  Constitutional: Negative.   HENT: Negative.   Respiratory: Negative.   Cardiovascular: Negative.   Gastrointestinal: Negative.   Genitourinary: Negative.   Musculoskeletal: Positive for joint swelling.  Skin: Negative.  All other systems reviewed and are negative.      Objective:    Physical Exam Vitals reviewed.  Constitutional:      Appearance: Normal appearance.  HENT:     Head: Normocephalic.     Nose: Nose normal.  Eyes:     Conjunctiva/sclera: Conjunctivae normal.  Cardiovascular:     Rate and Rhythm: Normal rate and regular rhythm.     Pulses: Normal pulses.     Heart sounds: Normal heart sounds.  Pulmonary:     Effort: Pulmonary effort is normal.     Breath sounds: Normal breath sounds.  Abdominal:     General: Bowel sounds are normal.  Musculoskeletal:     Right hand: Tenderness present.  Neurological:     Mental  Status: She is alert and oriented to person, place, and time.     BP 123/87   Pulse 91   Temp 97.7 F (36.5 C) (Temporal)   Ht 5\' 4"  (1.626 m)   Wt 235 lb (106.6 kg)   BMI 40.34 kg/m  Wt Readings from Last 3 Encounters:  06/12/20 235 lb (106.6 kg)  06/06/20 235 lb 2 oz (106.7 kg)  05/10/20 234 lb (106.1 kg)    Health Maintenance Due  Topic Date Due  . Hepatitis C Screening  Never done  . COVID-19 Vaccine (1) Never done  . PAP SMEAR-Modifier  04/06/2020    There are no preventive care reminders to display for this patient.   Lab Results  Component Value Date   TSH 1.780 09/23/2016   Lab Results  Component Value Date   WBC 9.4 10/21/2019   HGB 14.3 10/21/2019   HCT 43.3 10/21/2019   MCV 93.9 10/21/2019   PLT 247 10/21/2019   Lab Results  Component Value Date   NA 138 08/21/2019   K 3.6 08/21/2019   CO2 26 08/21/2019   GLUCOSE 92 08/21/2019   BUN 10 08/21/2019   CREATININE 0.72 08/21/2019   BILITOT 0.4 08/21/2019   ALKPHOS 75 08/21/2019   AST 52 (H) 08/21/2019   ALT 62 (H) 08/21/2019   PROT 8.1 08/21/2019   ALBUMIN 4.4 08/21/2019   CALCIUM 9.3 08/21/2019   ANIONGAP 10 08/21/2019        Assessment & Plan:   Problem List Items Addressed This Visit      Other   Pain of right thumb - Primary    Unresolved pain of right thumb.  Patient completed steroid taper and meloxicam with no therapeutic effect.  Waiting on appointment to rheumatologist.  Started patient on naproxen 500 mg twice daily.  Provided education to patient with printed handouts given.  Follow-up with worsening unresolved symptoms.      Relevant Medications   naproxen (NAPROSYN) 500 MG tablet       Meds ordered this encounter  Medications  . naproxen (NAPROSYN) 500 MG tablet    Sig: Take 1 tablet (500 mg total) by mouth 2 (two) times daily with a meal.    Dispense:  30 tablet    Refill:  0    Order Specific Question:   Supervising Provider    Answer:   [name]  [[id number]]     Ivy Lynn, NP

## 2020-06-12 NOTE — Assessment & Plan Note (Signed)
Unresolved pain of right thumb.  Patient completed steroid taper and meloxicam with no therapeutic effect.  Waiting on appointment to rheumatologist.  Started patient on naproxen 500 mg twice daily.  Provided education to patient with printed handouts given.  Follow-up with worsening unresolved symptoms.

## 2020-07-24 NOTE — Progress Notes (Signed)
Office Visit Note  Patient: Raven [name]             Date of Birth: 04/13/1990           MRN: [id number]             PCP: [name], DO Referring: Gwenlyn Perking, FNP Visit Date: 07/25/2020 Occupation: Nurse  Subjective:  New Patient (Initial Visit) (Patient complains of right hand pain, stiffness, and swelling. Patient has tried NSAIDs and Prednisone with some relief. Patient is right hand dominant. )   History of Present Illness: Raven [name] is a 30 y.o. female here for evaluation of right thumb pain and positive ANA. She has been seen previously with [facility] with workup showing positive ANA antibodies but suspected hand inflammation appeared to be self limited at the time improving after a period of conservative treatment with NSAIDs. Currently she has swelling and pain at the right thumb that started acutely in mid April. She returned from a trip at that time with no recollection of any injury or heavy use preceding these symptoms. She saw her PCP with treatments including a short term steroid taper that helped for a few days, and oral meloxicam that helps her symptoms partially.  Orthopedics evaluation with hand xray reportedly demonstrated no arthritis changes. Currently she still has the persistent swelling in her right 1st MCP joint with some discoloration and no other joints involved. This does feel similar to previously episodes of swollen joints just different distribution. She does not report any skin rashes or systemic symptoms.  Labs reviewed 04/2019 ANA 1:160 homogenous, nuclear dots RF neg CRP neg HLA-B27 neg AST 58 ALT 53   Activities of Daily Living:  Patient reports morning stiffness for 2 hours.   Patient Reports nocturnal pain.  Difficulty dressing/grooming: Reports Difficulty climbing stairs: Denies Difficulty getting out of chair: Denies Difficulty using hands for taps, buttons, cutlery, and/or writing:  Reports  Review of Systems  Constitutional:  Negative for fatigue.  HENT:  Negative for mouth sores, mouth dryness and nose dryness.   Eyes:  Negative for pain, itching, visual disturbance and dryness.  Respiratory:  Negative for cough, hemoptysis, shortness of breath and difficulty breathing.   Cardiovascular:  Negative for chest pain, palpitations and swelling in legs/feet.  Gastrointestinal:  Negative for abdominal pain, blood in stool, constipation and diarrhea.  Endocrine: Negative for increased urination.  Genitourinary:  Negative for painful urination.  Musculoskeletal:  Positive for joint pain, joint pain, joint swelling and morning stiffness. Negative for myalgias, muscle weakness, muscle tenderness and myalgias.  Skin:  Negative for color change, rash and redness.  Allergic/Immunologic: Negative for susceptible to infections.  Neurological:  Positive for headaches. Negative for dizziness, numbness, memory loss and weakness.  Hematological:  Negative for swollen glands.  Psychiatric/Behavioral:  Negative for sleep disturbance.    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   Otosclerosis 09/03/2018   Cesarean delivery delivered 06/15/2014    Past Medical  History:  Diagnosis Date   Complication of anesthesia    Elevated liver enzymes    Endometriosis    Medical history non-contributory    Otosclerosis    PONV (postoperative nausea and vomiting)     Family History  Problem Relation  Age of Onset   Other Mother        Elevated liver enzymes   Diabetes Mother    Hypertension Mother    Thyroid disease Mother    Diabetes Father    Hypertension Father    Hyperlipidemia Father    Other Brother        Elevated liver enzymes   Asthma Paternal Aunt    Asthma Paternal Grandmother    Healthy Son    Allergic rhinitis Neg Hx    Eczema Neg Hx    Urticaria Neg Hx    Past Surgical History:  Procedure Laterality Date   CESAREAN SECTION N/A 06/14/2014   Procedure: CESAREAN SECTION;  Surgeon: Bobbye Charleston, MD;  Location: [facility];  Service: Obstetrics;  Laterality: N/A;   EXCISION MASS ABDOMINAL N/A 10/21/2019   Procedure: EXCISION MASS ABDOMINAL;  Surgeon: Clovis Riley, MD;  Location: [facility];  Service: General;  Laterality: N/A;   stebactomy     Social History   Social History Narrative   Not on file   Immunization History  Administered Date(s) Administered   Influenza-Unspecified 11/10/2013, 11/10/2016   PFIZER(Purple Top)SARS-COV-2 Vaccination 02/24/2019, 03/17/2019   Tdap 05/01/2014     Objective: Vital Signs: BP 126/86 (BP Location: Right Arm, Patient Position: Sitting, Cuff Size: Normal)   Pulse 98   Ht 5' 4.5" (1.638 m)   Wt 231 lb 3.2 oz (104.9 kg)   BMI 39.07 kg/m    GENERAL- alert, co-operative, NAD HEENT- Atraumatic, PERRL, EOMI, oral mucosa appears moist, good and intact dentition, no carotid bruit, no cervical LN enlargement. CARDIAC- RRR, no murmurs, rubs or gallops. RESP- CTAB, no wheezes or crackles. EXTREMITIES- symmetric, no pedal edema. SKIN- Warm, dry, No rash or lesion.   Musculoskeletal Exam:  Neck full ROM no tenderness Shoulders full ROM no tenderness or swelling Elbows full ROM no tenderness or swelling Wrists full ROM no tenderness or swelling Fingers right 1st MCP tenderness, swelling, and faintly erythematous discoloration. Ultrasound inspection showing synovial thickening and color doppler enhancement. Rest of fingers  normal Knees full ROM no tenderness or swelling Ankles full ROM no tenderness or swelling MTPs full ROM no tenderness or swelling   CDAI Exam: CDAI Score: 9  Patient Global: 40 mm; Provider Global: 30 mm Swollen: 1 ; Tender: 1  Joint Exam 07/25/2020      Right  Left  MCP 1  Swollen Tender        Investigation: No additional findings.  Imaging: No results found.  Recent Labs: Lab Results  Component Value Date   WBC 9.4 10/21/2019   HGB 14.3 10/21/2019   PLT 247 10/21/2019   NA 138 08/21/2019   K 3.6 08/21/2019   CL 102 08/21/2019   CO2 26 08/21/2019   GLUCOSE 92 08/21/2019   BUN 10 08/21/2019   CREATININE 0.72 08/21/2019   BILITOT 0.4 08/21/2019   ALKPHOS 75 08/21/2019   AST 52 (H) 08/21/2019   ALT 62 (H) 08/21/2019   PROT 8.1 08/21/2019   ALBUMIN 4.4 08/21/2019   CALCIUM 9.3 08/21/2019   GFRAA >60 08/21/2019    Speciality Comments: No specialty comments available.  Procedures:  No procedures performed Allergies: Other and Sulfamethoxazole-trimethoprim  Assessment / Plan:     Visit Diagnoses: Positive ANA (antinuclear antibody) - Plan: Sedimentation rate, C-reactive protein, RNP Antibody, Anti-Smith antibody, Sjogrens syndrome-A extractable nuclear antibody, Sjogrens syndrome-B extractable nuclear antibody, Anti-DNA antibody, double-stranded, CBC with Differential/Platelet, COMPLETE METABOLIC PANEL WITH GFR  Positive ANA antibodies identified since years ago but without persistent inflammatory symptoms no existing connective tissue disease diagnosis.  Does complain of interval developments such as otosclerosis of bilateral ears and endometriosis.  Skin rashes since last year but had been free of any joint pain until April but the current inflammation.  We will recheck more specific ENA's and markers of systemic inflammation.  Based on current findings I do suspect this represents an inflammatory process left to see whether disease severity warrants long-term  DMARD treatment.  Pain of right thumb - Plan: Sedimentation rate, C-reactive protein  Right first MCP joint inflammation ultrasound exam shows synovial hypertrophy and Doppler enhancement there is no evidence I see of tendinitis to suggest a trauma or overuse type soft tissue injury.  Recommended continue conservative treatment with NSAIDs icing and resting the meantime while work-up as above.  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  She has history of previous elevated transaminase results with fatty liver disease.  Orders: Orders Placed This Encounter  Procedures   Sedimentation rate   C-reactive protein   RNP Antibody   Anti-Smith antibody   Sjogrens syndrome-A extractable nuclear antibody   Sjogrens syndrome-B extractable nuclear antibody   Anti-DNA antibody, double-stranded   CBC with Differential/Platelet   COMPLETE METABOLIC PANEL WITH GFR    No orders of the defined types were placed in this encounter.    Follow-Up Instructions: Return in about 2 weeks (around 08/08/2020) for New pt f/u R thumb arthritis, pos ANA.   Collier Salina, MD  Note - This record has been created using [employer].  Chart creation errors have been sought, but may not always  have been located. Such creation errors do not reflect on  the standard of medical care.

## 2020-07-25 ENCOUNTER — Other Ambulatory Visit: Payer: Self-pay

## 2020-07-25 ENCOUNTER — Encounter: Payer: Self-pay | Admitting: Internal Medicine

## 2020-07-25 ENCOUNTER — Ambulatory Visit: Payer: No Typology Code available for payment source | Admitting: Internal Medicine

## 2020-07-25 VITALS — BP 126/86 | HR 98 | Ht 64.5 in | Wt 231.2 lb

## 2020-07-25 DIAGNOSIS — R768 Other specified abnormal immunological findings in serum: Secondary | ICD-10-CM | POA: Diagnosis not present

## 2020-07-25 DIAGNOSIS — M359 Systemic involvement of connective tissue, unspecified: Secondary | ICD-10-CM

## 2020-07-25 DIAGNOSIS — Z79899 Other long term (current) drug therapy: Secondary | ICD-10-CM | POA: Insufficient documentation

## 2020-07-25 DIAGNOSIS — M79644 Pain in right finger(s): Secondary | ICD-10-CM

## 2020-07-25 HISTORY — DX: Systemic involvement of connective tissue, unspecified: M35.9

## 2020-07-25 NOTE — Patient Instructions (Signed)
Hydroxychloroquine tablets What is this medication? HYDROXYCHLOROQUINE (hye drox ee KLOR oh kwin) is used to treat rheumatoidarthritis and systemic lupus erythematosus. It is also used to treat malaria. This medicine may be used for other purposes; ask your health care provider orpharmacist if you have questions. COMMON BRAND NAME(S): Plaquenil, Quineprox What should I tell my care team before I take this medication? They need to know if you have any of these conditions: diabetes eye disease, vision problems G6PD deficiency heart disease history of irregular heartbeat if you often drink alcohol kidney disease liver disease porphyria psoriasis an unusual or allergic reaction to chloroquine, hydroxychloroquine, other medicines, foods, dyes, or preservatives pregnant or trying to get pregnant breast-feeding How should I use this medication? Take this medicine by mouth with a glass of water. Take it as directed on the prescription label. Do not cut, crush or chew this medicine. Swallow the tablets whole. Take it with food. Do not take it more than directed. Take all of this medicine unless your health care provider tells you to stop it early.Keep taking it even if you think you are better. Take products with antacids in them at a different time of day than this medicine. Take this medicine 4 hours before or 4 hours after antacids. Talk toyour health care provider if you have questions. Talk to your pediatrician regarding the use of this medicine in children. Whilethis drug may be prescribed for selected conditions, precautions do apply. Overdosage: If you think you have taken too much of this medicine contact apoison control center or emergency room at once. NOTE: This medicine is only for you. Do not share this medicine with others. What if I miss a dose? If you miss a dose, take it as soon as you can. If it is almost time for yournext dose, take only that dose. Do not take double or extra  doses. What may interact with this medication? Do not take this medicine with any of the following medications: cisapride dronedarone pimozide thioridazine This medicine may also interact with the following medications: ampicillin antacids cimetidine cyclosporine digoxin kaolin medicines for diabetes, like insulin, glipizide, glyburide medicines for seizures like carbamazepine, phenobarbital, phenytoin mefloquine methotrexate other medicines that prolong the QT interval (cause an abnormal heart rhythm) praziquantel This list may not describe all possible interactions. Give your health care provider a list of all the medicines, herbs, non-prescription drugs, or dietary supplements you use. Also tell them if you smoke, drink alcohol, or use illegaldrugs. Some items may interact with your medicine. What should I watch for while using this medication? Visit your health care provider for regular checks on your progress. Tell your health care provider if your symptoms do not start to get better or if they getworse. You may need blood work done while you are taking this medicine. If you take other medicines that can affect heart rhythm, you may need more testing. Talkto your health care provider if you have questions. Your vision may be tested before and during use of this medicine. Tell yourhealth care provider right away if you have any change in your eyesight. This medicine may cause serious skin reactions. They can happen weeks to months after starting the medicine. Contact your health care provider right away if you notice fevers or flu-like symptoms with a rash. The rash may be red or purple and then turn into blisters or peeling of the skin. Or, you might notice a red rash with swelling of the face, lips or lymph nodes   in your neck or underyour arms. If you or your family notice any changes in your behavior, such as new or worsening depression, thoughts of harming yourself, anxiety, or  other unusual or disturbing thoughts, or memory loss, call your health care provider rightaway. What side effects may I notice from receiving this medication? Side effects that you should report to your doctor or health care professionalas soon as possible: allergic reactions (skin rash, itching or hives; swelling of the face, lips, or tongue) changes in vision decreased hearing, ringing in the ears heartbeat rhythm changes (trouble breathing; chest pain; dizziness; fast, irregular heartbeat; feeling faint or lightheaded, falls) liver injury (dark yellow or brown urine; general ill feeling or flu-like symptoms; loss of appetite, right upper belly pain; unusually weak or tired, yellowing of the eyes or skin) low blood sugar (feeling anxious; confusion; dizziness; increased hunger; unusually weak or tired; increased sweating; shakiness; cold, clammy skin; irritable; headache; blurred vision; fast heartbeat; loss of consciousness) low red blood cell counts (trouble breathing; feeling faint; lightheaded, falls; unusually weak or tired) muscle weakness pain, tingling, numbness in the hands or feet rash, fever, and swollen lymph nodes redness, blistering, peeling or loosening of the skin, including inside the mouth suicidal thoughts, mood changes uncontrollable head, mouth, neck, arm, or leg movements unusual bruising or bleeding Side effects that usually do not require medical attention (report to yourdoctor or health care professional if they continue or are bothersome): diarrhea hair loss irritable This list may not describe all possible side effects. Call your doctor for medical advice about side effects. You may report side effects to FDA at1-800-FDA-1088. Where should I keep my medication? Keep out of the reach of children and pets. Store at room temperature up to 30 degrees C (86 degrees F). Protect fromlight. Get rid of any unused medicine after the expiration date. To get rid of medicines  that are no longer needed or have expired: Take the medicine to a medicine take-back program. Check with your pharmacy or law enforcement to find a location. If you cannot return the medicine, check the label or package insert to see if the medicine should be thrown out in the garbage or flushed down the toilet. If you are not sure, ask your health care provider. If it is safe to put it in the trash, empty the medicine out of the container. Mix the medicine with cat litter, dirt, coffee grounds, or other unwanted substance. Seal the mixture in a bag or container. Put it in the trash. NOTE: This sheet is a summary. It may not cover all possible information. If you have questions about this medicine, talk to your doctor, pharmacist, orhealth care provider.  2022 [employer] (2019-07-18 15:07:49)  

## 2020-07-26 LAB — CBC WITH DIFFERENTIAL/PLATELET
Absolute Monocytes: 536 cells/uL (ref 200–950)
Basophils Absolute: 38 cells/uL (ref 0–200)
Basophils Relative: 0.4 %
Eosinophils Absolute: 103 cells/uL (ref 15–500)
Eosinophils Relative: 1.1 %
HCT: 45.8 % — ABNORMAL HIGH (ref 35.0–45.0)
Hemoglobin: 15 g/dL (ref 11.7–15.5)
Lymphs Abs: 2792 cells/uL (ref 850–3900)
MCH: 30.3 pg (ref 27.0–33.0)
MCHC: 32.8 g/dL (ref 32.0–36.0)
MCV: 92.5 fL (ref 80.0–100.0)
MPV: 10.6 fL (ref 7.5–12.5)
Monocytes Relative: 5.7 %
Neutro Abs: 5931 cells/uL (ref 1500–7800)
Neutrophils Relative %: 63.1 %
Platelets: 271 10*3/uL (ref 140–400)
RBC: 4.95 10*6/uL (ref 3.80–5.10)
RDW: 12.4 % (ref 11.0–15.0)
Total Lymphocyte: 29.7 %
WBC: 9.4 10*3/uL (ref 3.8–10.8)

## 2020-07-26 LAB — COMPLETE METABOLIC PANEL WITH GFR
AG Ratio: 1.5 (calc) (ref 1.0–2.5)
ALT: 34 U/L — ABNORMAL HIGH (ref 6–29)
AST: 28 U/L (ref 10–30)
Albumin: 4.6 g/dL (ref 3.6–5.1)
Alkaline phosphatase (APISO): 72 U/L (ref 31–125)
BUN: 8 mg/dL (ref 7–25)
CO2: 25 mmol/L (ref 20–32)
Calcium: 9.9 mg/dL (ref 8.6–10.2)
Chloride: 103 mmol/L (ref 98–110)
Creat: 0.79 mg/dL (ref 0.50–1.10)
GFR, Est African American: 116 mL/min/{1.73_m2} (ref >=60)
GFR, Est Non African American: 100 mL/min/{1.73_m2} (ref >=60)
Globulin: 3 g/dL (calc) (ref 1.9–3.7)
Glucose, Bld: 83 mg/dL (ref 65–99)
Potassium: 4.7 mmol/L (ref 3.5–5.3)
Sodium: 137 mmol/L (ref 135–146)
Total Bilirubin: 0.4 mg/dL (ref 0.2–1.2)
Total Protein: 7.6 g/dL (ref 6.1–8.1)

## 2020-07-26 LAB — SJOGRENS SYNDROME-B EXTRACTABLE NUCLEAR ANTIBODY: SSB (La) (ENA) Antibody, IgG: <1 AI

## 2020-07-26 LAB — SJOGRENS SYNDROME-A EXTRACTABLE NUCLEAR ANTIBODY: SSA (Ro) (ENA) Antibody, IgG: <1 AI

## 2020-07-26 LAB — ANTI-SMITH ANTIBODY: ENA SM Ab Ser-aCnc: <1 AI

## 2020-07-26 LAB — C-REACTIVE PROTEIN: CRP: 3.8 mg/L (ref <8.0)

## 2020-07-26 LAB — RNP ANTIBODY: Ribonucleic Protein(ENA) Antibody, IgG: <1 AI

## 2020-07-26 LAB — SEDIMENTATION RATE: Sed Rate: 19 mm/h (ref 0–20)

## 2020-07-26 LAB — ANTI-DNA ANTIBODY, DOUBLE-STRANDED: ds DNA Ab: 1 IU/mL

## 2020-08-19 NOTE — Progress Notes (Signed)
Office Visit Note  Patient: Raven [name]             Date of Birth: 15-Jun-1990           MRN: [id number]             PCP: [name], DO Referring: [name], DO Visit Date: 08/20/2020   Subjective:  Follow-up (Patient complains of continued pain, stiffness, and swelling in bilateral hands (right>left). )   History of Present Illness: Raven [name] is a 30 y.o. female here for follow up for positive ANA with skin rashes and finger pain and swelling. We did not start DMARD treatment at initial visit just using NSAIDs with f/u to decide if trial of inflammatory arthritis treatment needed.  Since her last visit she continues to have joint pain that is only slightly controlled with use of oral NSAIDs.  Most problematic is her right thumb MCP joint remains persistently swollen and has difficulty with apposition and strength affecting her work.  She has had pain more intermittently at other sides but right hand symptoms remain the worst.    Previous HPI: 07/25/20 Raven [name] is a 30 y.o. female here for evaluation of right thumb pain and positive ANA. She has been seen previously with [facility] with workup showing positive ANA antibodies but suspected hand inflammation appeared to be self limited at the time improving after a period of conservative treatment with NSAIDs. Currently she has swelling and pain at the right thumb that started acutely in mid April. She returned from a trip at that time with no recollection of any injury or heavy use preceding these symptoms. She saw her PCP with treatments including a short term steroid taper that helped for a few days, and oral meloxicam that helps her symptoms partially.  Orthopedics evaluation with hand xray reportedly demonstrated no arthritis changes. Currently she still has the persistent swelling in her right 1st MCP joint with some discoloration and no other joints involved. This does feel  similar to previously episodes of swollen joints just different distribution. She does not report any skin rashes or systemic symptoms.   Labs reviewed 04/2019 ANA 1:160 homogenous, nuclear dots RF neg CRP neg HLA-B27 neg AST 58 ALT 53   Review of Systems  Constitutional:  Negative for fatigue.  HENT:  Negative for mouth sores, mouth dryness and nose dryness.   Eyes:  Negative for pain, itching, visual disturbance and dryness.  Respiratory:  Negative for cough, hemoptysis, shortness of breath and difficulty breathing.   Cardiovascular:  Negative for chest pain, palpitations and swelling in legs/feet.  Gastrointestinal:  Negative for abdominal pain, blood in stool, constipation and diarrhea.  Endocrine: Negative for increased urination.  Genitourinary:  Negative for painful urination.  Musculoskeletal:  Positive for joint pain, joint pain, joint swelling and morning stiffness. Negative for myalgias, muscle weakness, muscle tenderness and myalgias.  Skin:  Negative for color change, rash and redness.  Allergic/Immunologic: Negative for susceptible to infections.  Neurological:  Positive for headaches. Negative for dizziness, numbness, memory loss and weakness.  Hematological:  Negative for swollen glands.  Psychiatric/Behavioral:  Negative for confusion and sleep disturbance.    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   Otosclerosis 09/03/2018   Cesarean delivery delivered 06/15/2014     Past Medical History:  Diagnosis Date   Complication of anesthesia    Elevated liver enzymes    Endometriosis    Medical history non-contributory    Otosclerosis    PONV (postoperative nausea and vomiting)     Family History  Problem Relation Age of Onset   Other Mother        Elevated liver enzymes   Diabetes Mother    Hypertension Mother    Thyroid disease Mother    Diabetes Father    Hypertension Father    Hyperlipidemia Father    Other Brother        Elevated liver enzymes   Asthma Paternal Aunt    Asthma Paternal Grandmother    Healthy Son    Allergic rhinitis Neg Hx    Eczema Neg Hx    Urticaria Neg Hx    Past Surgical History:  Procedure Laterality Date   CESAREAN SECTION N/A 06/14/2014   Procedure: CESAREAN SECTION;  Surgeon: Bobbye Charleston, MD;  Location: [facility];  Service: Obstetrics;  Laterality: N/A;   EXCISION MASS ABDOMINAL N/A 10/21/2019   Procedure: EXCISION MASS ABDOMINAL;  Surgeon: Clovis Riley, MD;  Location: [facility];  Service: General;  Laterality: N/A;   stebactomy     Social History   Social History Narrative   Not on file   Immunization History  Administered Date(s) Administered   Influenza-Unspecified 11/10/2013, 11/10/2016   PFIZER(Purple Top)SARS-COV-2 Vaccination 02/24/2019, 03/17/2019   Tdap 05/01/2014     Objective: Vital Signs: BP 133/84 (BP Location: Left Arm, Patient Position: Sitting, Cuff Size: Large)   Pulse 98   Ht 5' 4"  (1.626 m)   Wt 231 lb 9.6 oz (105.1 kg)   BMI 39.75 kg/m    Physical Exam Constitutional:      Appearance: She is obese.  Skin:    General: Skin is warm and dry.     Findings: No rash.  Neurological:     General: No focal deficit present.     Mental Status: She is alert.  Psychiatric:        Mood and Affect: Mood normal.     Musculoskeletal Exam:  Shoulders full ROM no tenderness or swelling Elbows full ROM no tenderness or swelling Wrists full ROM no tenderness or swelling Fingers Right  thumb MCP swollen and tender with discoloration, left 3rd finger stiffness and pain without palpable synovitis Knees full ROM no tenderness or swelling   CDAI Exam: CDAI Score: -- Patient Global: --; Provider Global: -- Swollen: 1 ; Tender: 3  Joint Exam 08/20/2020      Right  Left  MCP 1  Swollen Tender     MCP 3      Tender  PIP 3      Tender     Investigation: No additional findings.  Imaging: No results found.  Recent Labs: Lab Results  Component Value Date   WBC 9.4 07/25/2020   HGB 15.0 07/25/2020   PLT 271 07/25/2020   NA 137 07/25/2020   K 4.7 07/25/2020   CL 103 07/25/2020   CO2 25 07/25/2020   GLUCOSE 83 07/25/2020   BUN 8 07/25/2020  CREATININE 0.79 07/25/2020   BILITOT 0.4 07/25/2020   ALKPHOS 75 08/21/2019   AST 28 07/25/2020   ALT 34 (H) 07/25/2020   PROT 7.6 07/25/2020   ALBUMIN 4.4 08/21/2019   CALCIUM 9.9 07/25/2020   GFRAA 116 07/25/2020    Speciality Comments: No specialty comments available.  Procedures:  No procedures performed Allergies: Other and Sulfamethoxazole-trimethoprim   Assessment / Plan:     Visit Diagnoses: Positive ANA (antinuclear antibody) - Plan: hydroxychloroquine (PLAQUENIL) 200 MG tablet, Ambulatory referral to Ophthalmology  Positive ANA with no specific ENA titers but has active joint inflammation of at least the right thumb on exam today and symptoms have been persistent on a continuing basis for least a few months now.  Very limited number of joints involved but may represent an undifferentiated connective tissue disease versus seronegative inflammatory arthritis.  She has baseline abnormal liver function tests on most recent metabolic panel so would not recommend trial of methotrexate as a DMARD.  We will try hydroxychloroquine 400 mg daily for arthritis the plan to follow-up within 8 weeks.  Discussed medication including risks of QT prolongation need for retinal toxicity monitoring referral to ophthalmology  placed.  Pain of right thumb  Multiple joint pain   Right thumb pain is clearly inflammatory pain in other joints without the obvious synovitis on exam but may also represent early arthritis.  Orders: Orders Placed This Encounter  Procedures   Ambulatory referral to Ophthalmology    Meds ordered this encounter  Medications   hydroxychloroquine (PLAQUENIL) 200 MG tablet    Sig: Take 2 tablets (400 mg total) by mouth daily.    Dispense:  60 tablet    Refill:  1      Follow-Up Instructions: Return in about 2 months (around 10/21/2020) for Seronegative RA new HCQ f/u 8wks.   Collier Salina, MD  Note - This record has been created using [employer].  Chart creation errors have been sought, but may not always  have been located. Such creation errors do not reflect on  the standard of medical care.

## 2020-08-20 ENCOUNTER — Other Ambulatory Visit: Payer: Self-pay

## 2020-08-20 ENCOUNTER — Ambulatory Visit (INDEPENDENT_AMBULATORY_CARE_PROVIDER_SITE_OTHER): Payer: No Typology Code available for payment source | Admitting: Internal Medicine

## 2020-08-20 ENCOUNTER — Encounter: Payer: Self-pay | Admitting: Internal Medicine

## 2020-08-20 VITALS — BP 133/84 | HR 98 | Ht 64.0 in | Wt 231.6 lb

## 2020-08-20 DIAGNOSIS — M2550 Pain in unspecified joint: Secondary | ICD-10-CM

## 2020-08-20 DIAGNOSIS — R768 Other specified abnormal immunological findings in serum: Secondary | ICD-10-CM

## 2020-08-20 DIAGNOSIS — M79644 Pain in right finger(s): Secondary | ICD-10-CM

## 2020-08-20 MED ORDER — HYDROXYCHLOROQUINE SULFATE 200 MG PO TABS: 400.0000 mg | ORAL_TABLET | Freq: Every day | ORAL | 1 refills | Status: DC

## 2020-08-20 NOTE — Patient Instructions (Signed)
Hydroxychloroquine tablets What is this medication? HYDROXYCHLOROQUINE (hye drox ee KLOR oh kwin) is used to treat rheumatoidarthritis and systemic lupus erythematosus. It is also used to treat malaria. This medicine may be used for other purposes; ask your health care provider orpharmacist if you have questions. COMMON BRAND NAME(S): Plaquenil, Quineprox What should I tell my care team before I take this medication? They need to know if you have any of these conditions: diabetes eye disease, vision problems G6PD deficiency heart disease history of irregular heartbeat if you often drink alcohol kidney disease liver disease porphyria psoriasis an unusual or allergic reaction to chloroquine, hydroxychloroquine, other medicines, foods, dyes, or preservatives pregnant or trying to get pregnant breast-feeding How should I use this medication? Take this medicine by mouth with a glass of water. Take it as directed on the prescription label. Do not cut, crush or chew this medicine. Swallow the tablets whole. Take it with food. Do not take it more than directed. Take all of this medicine unless your health care provider tells you to stop it early.Keep taking it even if you think you are better. Take products with antacids in them at a different time of day than this medicine. Take this medicine 4 hours before or 4 hours after antacids. Talk toyour health care provider if you have questions. Talk to your pediatrician regarding the use of this medicine in children. Whilethis drug may be prescribed for selected conditions, precautions do apply. Overdosage: If you think you have taken too much of this medicine contact apoison control center or emergency room at once. NOTE: This medicine is only for you. Do not share this medicine with others. What if I miss a dose? If you miss a dose, take it as soon as you can. If it is almost time for yournext dose, take only that dose. Do not take double or extra  doses. What may interact with this medication? Do not take this medicine with any of the following medications: cisapride dronedarone pimozide thioridazine This medicine may also interact with the following medications: ampicillin antacids cimetidine cyclosporine digoxin kaolin medicines for diabetes, like insulin, glipizide, glyburide medicines for seizures like carbamazepine, phenobarbital, phenytoin mefloquine methotrexate other medicines that prolong the QT interval (cause an abnormal heart rhythm) praziquantel This list may not describe all possible interactions. Give your health care provider a list of all the medicines, herbs, non-prescription drugs, or dietary supplements you use. Also tell them if you smoke, drink alcohol, or use illegaldrugs. Some items may interact with your medicine. What should I watch for while using this medication? Visit your health care provider for regular checks on your progress. Tell your health care provider if your symptoms do not start to get better or if they getworse. You may need blood work done while you are taking this medicine. If you take other medicines that can affect heart rhythm, you may need more testing. Talkto your health care provider if you have questions. Your vision may be tested before and during use of this medicine. Tell yourhealth care provider right away if you have any change in your eyesight. This medicine may cause serious skin reactions. They can happen weeks to months after starting the medicine. Contact your health care provider right away if you notice fevers or flu-like symptoms with a rash. The rash may be red or purple and then turn into blisters or peeling of the skin. Or, you might notice a red rash with swelling of the face, lips or lymph nodes   in your neck or underyour arms. If you or your family notice any changes in your behavior, such as new or worsening depression, thoughts of harming yourself, anxiety, or  other unusual or disturbing thoughts, or memory loss, call your health care provider rightaway. What side effects may I notice from receiving this medication? Side effects that you should report to your doctor or health care professionalas soon as possible: allergic reactions (skin rash, itching or hives; swelling of the face, lips, or tongue) changes in vision decreased hearing, ringing in the ears heartbeat rhythm changes (trouble breathing; chest pain; dizziness; fast, irregular heartbeat; feeling faint or lightheaded, falls) liver injury (dark yellow or brown urine; general ill feeling or flu-like symptoms; loss of appetite, right upper belly pain; unusually weak or tired, yellowing of the eyes or skin) low blood sugar (feeling anxious; confusion; dizziness; increased hunger; unusually weak or tired; increased sweating; shakiness; cold, clammy skin; irritable; headache; blurred vision; fast heartbeat; loss of consciousness) low red blood cell counts (trouble breathing; feeling faint; lightheaded, falls; unusually weak or tired) muscle weakness pain, tingling, numbness in the hands or feet rash, fever, and swollen lymph nodes redness, blistering, peeling or loosening of the skin, including inside the mouth suicidal thoughts, mood changes uncontrollable head, mouth, neck, arm, or leg movements unusual bruising or bleeding Side effects that usually do not require medical attention (report to yourdoctor or health care professional if they continue or are bothersome): diarrhea hair loss irritable This list may not describe all possible side effects. Call your doctor for medical advice about side effects. You may report side effects to FDA at1-800-FDA-1088. Where should I keep my medication? Keep out of the reach of children and pets. Store at room temperature up to 30 degrees C (86 degrees F). Protect fromlight. Get rid of any unused medicine after the expiration date. To get rid of medicines  that are no longer needed or have expired: Take the medicine to a medicine take-back program. Check with your pharmacy or law enforcement to find a location. If you cannot return the medicine, check the label or package insert to see if the medicine should be thrown out in the garbage or flushed down the toilet. If you are not sure, ask your health care provider. If it is safe to put it in the trash, empty the medicine out of the container. Mix the medicine with cat litter, dirt, coffee grounds, or other unwanted substance. Seal the mixture in a bag or container. Put it in the trash. NOTE: This sheet is a summary. It may not cover all possible information. If you have questions about this medicine, talk to your doctor, pharmacist, orhealth care provider.  2022 [employer] (2019-07-18 15:07:49)  

## 2020-09-04 ENCOUNTER — Encounter: Payer: Self-pay | Admitting: Internal Medicine

## 2020-09-10 ENCOUNTER — Ambulatory Visit: Payer: No Typology Code available for payment source | Admitting: Internal Medicine

## 2020-09-10 ENCOUNTER — Other Ambulatory Visit: Payer: Self-pay

## 2020-09-10 ENCOUNTER — Encounter: Payer: Self-pay | Admitting: Internal Medicine

## 2020-09-10 ENCOUNTER — Other Ambulatory Visit (HOSPITAL_COMMUNITY): Payer: Self-pay

## 2020-09-10 VITALS — BP 141/83 | HR 120 | Ht 64.0 in | Wt 233.8 lb

## 2020-09-10 DIAGNOSIS — M2550 Pain in unspecified joint: Secondary | ICD-10-CM | POA: Diagnosis not present

## 2020-09-10 DIAGNOSIS — R768 Other specified abnormal immunological findings in serum: Secondary | ICD-10-CM | POA: Diagnosis not present

## 2020-09-10 DIAGNOSIS — R21 Rash and other nonspecific skin eruption: Secondary | ICD-10-CM | POA: Diagnosis not present

## 2020-09-10 DIAGNOSIS — Z79899 Other long term (current) drug therapy: Secondary | ICD-10-CM | POA: Diagnosis not present

## 2020-09-10 MED ORDER — PREDNISONE 10 MG PO TABS: ORAL_TABLET | ORAL | 0 refills | Status: DC

## 2020-09-10 MED ORDER — PREDNISONE 10 MG (21) PO TBPK
ORAL_TABLET | ORAL | 0 refills | Status: AC
  Filled 2020-09-10: qty 21, 6d supply, fill #0

## 2020-09-10 NOTE — Progress Notes (Signed)
Office Visit Note  Patient: Raven [name]             Date of Birth: 07/04/1990           MRN: [id number]             PCP: [name], DO Referring: [name], DO Visit Date: 09/10/2020   Subjective:  Follow-up (Patient complains of rash on bilateral upper extremities, chest, abdomen, and back with onset this morning. )   History of Present Illness: Raven [name] is a 30 y.o. female here for follow up for pain and swelling in her hands especially right thumb after starting hydroxychloroquine 3 weeks ago. She developed a new skin rash this weekend which she never experienced before. This started overnight she woke up with diffuse rash all over her trunk particularly beneath her breasts. This is not itchy or painful, and she took benadryl this morning after seeing this. She had also noticed some increase in pain and swelling in the left hand 3rd finger since last week.   Previous HPI: 08/20/20 Raven [name] is a 30 y.o. female here for follow up for positive ANA with skin rashes and finger pain and swelling. We did not start DMARD treatment at initial visit just using NSAIDs with f/u to decide if trial of inflammatory arthritis treatment needed.  Since her last visit she continues to have joint pain that is only slightly controlled with use of oral NSAIDs.  Most problematic is her right thumb MCP joint remains persistently swollen and has difficulty with apposition and strength affecting her work.  She has had pain more intermittently at other sides but right hand symptoms remain the worst.   Previous HPI: 07/25/20 Raven [name] is a 30 y.o. female here for evaluation of right thumb pain and positive ANA. She has been seen previously with [facility] with workup showing positive ANA antibodies but suspected hand inflammation appeared to be self limited at the time improving after a period of conservative treatment with NSAIDs. Currently she  has swelling and pain at the right thumb that started acutely in mid April. She returned from a trip at that time with no recollection of any injury or heavy use preceding these symptoms. She saw her PCP with treatments including a short term steroid taper that helped for a few days, and oral meloxicam that helps her symptoms partially.  Orthopedics evaluation with hand xray reportedly demonstrated no arthritis changes. Currently she still has the persistent swelling in her right 1st MCP joint with some discoloration and no other joints involved. This does feel similar to previously episodes of swollen joints just different distribution. She does not report any skin rashes or systemic symptoms.   Labs reviewed 04/2019 ANA 1:160 homogenous, nuclear dots RF neg CRP neg HLA-B27 neg AST 58 ALT 53  Review of Systems  Constitutional:  Negative for fatigue.  HENT:  Positive for mouth sores. Negative for mouth dryness and nose dryness.   Eyes:  Negative for pain, itching, visual disturbance and dryness.  Respiratory:  Negative for cough, hemoptysis, shortness of breath and difficulty breathing.   Cardiovascular:  Negative for chest pain, palpitations and swelling in legs/feet.  Gastrointestinal:  Negative for abdominal pain, blood in stool, constipation and diarrhea.  Endocrine: Negative for increased urination.  Genitourinary:  Negative for painful urination.  Musculoskeletal:  Positive for joint pain, joint pain, joint swelling and morning stiffness. Negative for myalgias, muscle weakness, muscle tenderness and myalgias.  Skin:  Positive for color change, rash, nodules/bumps and redness.  Allergic/Immunologic: Negative for susceptible to infections.  Neurological:  Positive for headaches. Negative for dizziness, numbness, memory loss and weakness.  Hematological:  Negative for swollen glands.  Psychiatric/Behavioral:  Negative for confusion and sleep disturbance.    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   Otosclerosis 09/03/2018   Cesarean delivery delivered 06/15/2014    Past Medical History:  Diagnosis Date   Complication of anesthesia    Elevated liver enzymes    Endometriosis    Medical history non-contributory    Otosclerosis    PONV (postoperative nausea and vomiting)     Family History  Problem Relation Age of Onset   Other Mother        Elevated liver enzymes   Diabetes Mother    Hypertension Mother    Thyroid disease Mother    Diabetes Father    Hypertension Father    Hyperlipidemia Father    Other Brother        Elevated liver enzymes   Asthma Paternal Aunt    Asthma Paternal Grandmother    Healthy Son    Allergic rhinitis Neg Hx    Eczema Neg Hx    Urticaria Neg Hx    Past Surgical History:  Procedure Laterality Date   CESAREAN SECTION N/A 06/14/2014   Procedure: CESAREAN SECTION;  Surgeon: Bobbye Charleston, MD;  Location: [facility];  Service: Obstetrics;  Laterality: N/A;   EXCISION MASS ABDOMINAL N/A 10/21/2019   Procedure: EXCISION MASS ABDOMINAL;  Surgeon: Clovis Riley, MD;  Location: [facility];  Service: General;  Laterality: N/A;   stebactomy     Social History   Social History Narrative   Not on file   Immunization History  Administered Date(s) Administered   Influenza-Unspecified 11/10/2013, 11/10/2016   PFIZER(Purple Top)SARS-COV-2 Vaccination 02/24/2019, 03/17/2019   Tdap 05/01/2014      Objective: Vital Signs: BP (!) 141/83 (BP Location: Left Arm, Patient Position: Sitting, Cuff Size: Normal)   Pulse (!) 120   Ht _0  (1.626 m)   Wt 233 lb 12.8 oz (106.1 kg)   BMI 40.13 kg/m    Physical Exam Constitutional:      Appearance: She is obese.  HENT:     Mouth/Throat:     Comments: Apthous ulcer present on ight buccal mucosa Cardiovascular:     Rate and Rhythm: Normal rate and regular rhythm.  Pulmonary:     Effort: Pulmonary effort is normal.     Breath sounds: Normal breath sounds.  Skin:    General: Skin is warm and dry.     Comments: Diffuse papules on sides and back of trunk some faintly erythematous, petechial appearing rash on abdomen and more erythematous but still scattered flat fast under bilateral breasts  Neurological:     Mental Status: She is alert.  Psychiatric:        Mood and Affect: Mood  normal.     Musculoskeletal Exam:  Elbows full ROM no tenderness or swelling Wrists full ROM no tenderness or swelling Fingers erythema and swelling of right 1st MCP with tenderness, left 3rd finger swelling and decreased ROM with tenderness along the proximal phalanx extending between MCP and PIP Knees full ROM no tenderness or swelling Ankles full ROM no tenderness or swelling  Investigation: No additional findings.  Imaging: No results found.  Recent Labs: Lab Results  Component Value Date   WBC 9.4 07/25/2020   HGB 15.0 07/25/2020   PLT 271 07/25/2020   NA 137 07/25/2020   K 4.7 07/25/2020   CL 103 07/25/2020   CO2 25 07/25/2020   GLUCOSE 83 07/25/2020   BUN 8 07/25/2020   CREATININE 0.79 07/25/2020   BILITOT 0.4 07/25/2020   ALKPHOS 75 08/21/2019   AST 28 07/25/2020   ALT 34 (H) 07/25/2020   PROT 7.6 07/25/2020   ALBUMIN 4.4 08/21/2019   CALCIUM 9.9 07/25/2020   GFRAA 116 07/25/2020    Speciality Comments: No specialty comments available.  Procedures:  No procedures performed Allergies: Other and Sulfamethoxazole-trimethoprim    Assessment / Plan:     Visit Diagnoses: Rash and other nonspecific skin eruption  Multiple joint pain - Plan: predniSONE (STERAPRED UNI-PAK 21 TAB) 10 MG (21) TBPK tablet, DISCONTINUED: predniSONE (DELTASONE) 10 MG tablet  Possible reaction to hydroxychloroquine versus new symptom of underlying process. Recommend stopping the drug and will start a 6 day prednisone taper from 60 mg. We will need to see how symptoms clear up during and a week after this. Discussed importance to seek more urgent f/u if symptoms worsen instead of improve during treatment.  Positive ANA (antinuclear antibody)  Exact clinical picture still not included in suspicious for recurrent symptoms as medication effect versus new symptom of an unspecified connective tissue disease process. Few joints remain inflamed again today.  High risk medication use  Will have to be cautious recommending alternate DMARD options for systemic therapy with intolerance and previous baseline LFT abnormalities.   Orders: No orders of the defined types were placed in this encounter.  Meds ordered this encounter  Medications   DISCONTD: predniSONE (DELTASONE) 10 MG tablet    Sig: Take 6 tablets (60 mg total) by mouth daily with breakfast for 1 day, THEN 5 tablets (50 mg total) daily with breakfast for 1 day, THEN 4 tablets (40 mg total) daily with breakfast for 1 day, THEN 3 tablets (30 mg total) daily with breakfast for 1 day, THEN 2 tablets (20 mg total) daily with breakfast for 1 day, THEN 1 tablet (10 mg total) daily with breakfast for 1 day.    Dispense:  21 tablet    Refill:  0   predniSONE (STERAPRED UNI-PAK 21 TAB) 10 MG (21) TBPK tablet    Sig: Take 6 tabs (60 mg total) by mouth daily with breakfast for 1 day, THEN 5 tabs (50 mg total) for 1 day, THEN 4 tabs (40 mg total)  for 1 day, THEN 3 tabs (30 mg total) for 1 day, THEN 2 tabs (20 mg total)for 1 day, THEN 1 tab (10 mg total) for 1 day.    Dispense:  21 tablet    Refill:  0       Follow-Up Instructions: No follow-ups on file.   Collier Salina, MD  Note - This record has been created using [employer].  Chart creation errors have been sought, but may not always  have  been located. Such creation errors do not reflect on  the standard of medical care.

## 2020-09-17 ENCOUNTER — Encounter: Payer: Self-pay | Admitting: Internal Medicine

## 2020-09-17 NOTE — Telephone Encounter (Signed)
If rash is medication related hydroxychloroquine stays around a while. She can try adding using a topical steroid such as hydrocortisone 0.1% on the affected areas. If this stays pretty mild we can just keep an eye on it, if she starts having more trouble can let us know options would be to take another look or possibly continue lower amount of prednisone for longer.

## 2020-09-24 ENCOUNTER — Other Ambulatory Visit: Payer: Self-pay

## 2020-09-24 ENCOUNTER — Ambulatory Visit: Payer: No Typology Code available for payment source | Admitting: Internal Medicine

## 2020-09-24 ENCOUNTER — Encounter: Payer: Self-pay | Admitting: Internal Medicine

## 2020-09-24 VITALS — BP 130/92 | HR 96 | Ht 64.0 in | Wt 238.0 lb

## 2020-09-24 DIAGNOSIS — M79644 Pain in right finger(s): Secondary | ICD-10-CM | POA: Diagnosis not present

## 2020-09-24 DIAGNOSIS — R768 Other specified abnormal immunological findings in serum: Secondary | ICD-10-CM | POA: Diagnosis not present

## 2020-09-24 DIAGNOSIS — R21 Rash and other nonspecific skin eruption: Secondary | ICD-10-CM | POA: Diagnosis not present

## 2020-09-24 NOTE — Progress Notes (Signed)
Office Visit Note  Patient: Raven [name]             Date of Birth: [birth year]           MRN: [id number]             PCP: [name], DO Referring: [name], DO Visit Date: 09/24/2020 Occupation: '@GUAROCC'$ @  Subjective:  Follow-up (Patient's rash has cleared up since discontinuing PLQ 2 weeks ago. Patient complains of dry lips. )   History of Present Illness: Raven [name] is a 30 y.o. female here for follow-up with persistent right thumb inflammation and pain incompletely controlled by NSAIDs or local injection after trial of starting hydroxychloroquine.  She developed diffuse skin rashes so we discontinued the medicine and treatment with a short steroid taper largely improved the rash and now it is completely gone.  Her joint inflammation is currently improved with no ongoing swelling but still pain with use.  She is also had at least 1 mouth sore and some chronic dryness.   Review of Systems  Constitutional:  Negative for fatigue.  HENT:  Positive for mouth sores and mouth dryness. Negative for nose dryness.   Eyes:  Negative for pain, itching, visual disturbance and dryness.  Respiratory:  Negative for cough, hemoptysis, shortness of breath and difficulty breathing.   Cardiovascular:  Positive for irregular heartbeat. Negative for chest pain, palpitations and swelling in legs/feet.  Gastrointestinal:  Negative for abdominal pain, blood in stool, constipation and diarrhea.  Endocrine: Negative for increased urination.  Genitourinary:  Negative for painful urination.  Musculoskeletal:  Positive for joint pain, joint pain, joint swelling and morning stiffness. Negative for myalgias, muscle weakness, muscle tenderness and myalgias.  Skin:  Negative for color change, rash and redness.  Allergic/Immunologic: Negative for susceptible to infections.  Neurological:  Positive for headaches. Negative for dizziness, numbness, memory loss and weakness.   Hematological:  Negative for swollen glands.  Psychiatric/Behavioral:  Negative for confusion and sleep disturbance.    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   Otosclerosis 09/03/2018   Cesarean delivery delivered 06/15/2014    Past Medical History:  Diagnosis Date   Complication of anesthesia    Elevated liver enzymes    Endometriosis    Medical history non-contributory    Otosclerosis    PONV (postoperative nausea and vomiting)     Family History  Problem Relation Age of Onset   Other Mother        Elevated liver enzymes   Diabetes Mother    Hypertension Mother    Thyroid disease Mother    Diabetes Father    Hypertension Father    Hyperlipidemia Father    Other Brother        Elevated liver enzymes   Asthma Paternal Aunt    Asthma Paternal 48    Healthy Son  Allergic rhinitis Neg Hx    Eczema Neg Hx    Urticaria Neg Hx    Past Surgical History:  Procedure Laterality Date   CESAREAN SECTION N/A 06/14/2014   Procedure: CESAREAN SECTION;  Surgeon: Bobbye Charleston, MD;  Location: [facility];  Service: Obstetrics;  Laterality: N/A;   EXCISION MASS ABDOMINAL N/A 10/21/2019   Procedure: EXCISION MASS ABDOMINAL;  Surgeon: Clovis Riley, MD;  Location: [facility];  Service: General;  Laterality: N/A;   stebactomy     Social History   Social  History Narrative   Not on file   Immunization History  Administered Date(s) Administered   Influenza-Unspecified 11/10/2013, 11/10/2016, 11/11/2019   PFIZER(Purple Top)SARS-COV-2 Vaccination 02/24/2019, 03/17/2019   Tdap 05/01/2014     Objective: Vital Signs: BP (!) 130/92 (BP Location: Left Arm, Patient Position: Sitting, Cuff Size: Normal)   Pulse 96   Ht '5\' 4"'$  (1.626 m)   Wt 238 lb (108 kg)   BMI 40.85 kg/m    Physical Exam Constitutional:      Appearance: She is obese.  HENT:     Right Ear: External ear normal.     Left Ear: External ear normal.  Cardiovascular:     Rate and Rhythm: Normal rate and regular rhythm.  Pulmonary:     Effort: Pulmonary effort is normal.     Breath sounds: Normal breath sounds.  Skin:    General: Skin is warm and dry.     Findings: No rash.  Neurological:     Mental Status: She is alert.  Psychiatric:        Mood and Affect: Mood normal.     Musculoskeletal Exam:  Elbows full ROM no tenderness or swelling Wrists full ROM no tenderness or swelling Discoloration and tenderness of right first MCP joint other right hand joints normal, left third finger tenderness without palpable synovitis Knees full ROM no tenderness or swelling  Investigation: No additional findings.  Imaging: No results found.  Recent Labs: Lab Results  Component Value Date   WBC 7.6 09/28/2020   HGB 13.5 09/28/2020   PLT 231 09/28/2020   NA 141 09/28/2020   K 4.0 09/28/2020   CL 102 09/28/2020   CO2 24 09/28/2020   GLUCOSE 120 (H) 09/28/2020   BUN 10 09/28/2020   CREATININE 0.79 09/28/2020   BILITOT 0.2 09/28/2020   ALKPHOS 73 09/28/2020   AST 24 09/28/2020   ALT 28 09/28/2020   PROT 6.7 09/28/2020   ALBUMIN 4.2 09/28/2020   CALCIUM 9.5 09/28/2020   GFRAA 116 07/25/2020    Speciality Comments: No specialty comments available.  Procedures:  No procedures performed Allergies: Other, Plaquenil [hydroxychloroquine], and  Sulfamethoxazole-trimethoprim   Assessment / Plan:     Visit Diagnoses: Positive ANA (antinuclear antibody)  Positive ANA with joint inflammation in fingers of both hands worst on the right no other specific clinical criteria or serologies so far.  Considering risks of side effects do not recommend empiric treatment escalation for a seronegative disease at this time.  Can continue nonsteroidal anti-inflammatory use.  We will send for AVISE CTD panel for more extensive serology work-up.  Pain of right thumb  Right thumb pain swelling is decreased compared to our last exam though remains discolored slightly decreased range of motion tenderness and stiffness.  Rash and other nonspecific skin eruption  Skin rash resolved completely little over 1 week after stopping the hydroxychloroquine, does appear to be medication related reaction.  Orders: No orders of the defined  types were placed in this encounter.  No orders of the defined types were placed in this encounter.    Follow-Up Instructions: No follow-ups on file.   Collier Salina, MD  Note - This record has been created using [employer].  Chart creation errors have been sought, but may not always  have been located. Such creation errors do not reflect on  the standard of medical care.

## 2020-09-25 ENCOUNTER — Ambulatory Visit: Payer: No Typology Code available for payment source | Admitting: Internal Medicine

## 2020-09-27 ENCOUNTER — Encounter: Payer: Self-pay | Admitting: Internal Medicine

## 2020-09-28 ENCOUNTER — Encounter: Payer: Self-pay | Admitting: Family Medicine

## 2020-09-28 ENCOUNTER — Other Ambulatory Visit: Payer: Self-pay | Admitting: Family Medicine

## 2020-09-28 ENCOUNTER — Ambulatory Visit (INDEPENDENT_AMBULATORY_CARE_PROVIDER_SITE_OTHER): Payer: No Typology Code available for payment source | Admitting: Family Medicine

## 2020-09-28 ENCOUNTER — Other Ambulatory Visit: Payer: Self-pay

## 2020-09-28 DIAGNOSIS — Z713 Dietary counseling and surveillance: Secondary | ICD-10-CM

## 2020-09-28 DIAGNOSIS — Z7689 Persons encountering health services in other specified circumstances: Secondary | ICD-10-CM

## 2020-09-28 DIAGNOSIS — R002 Palpitations: Secondary | ICD-10-CM | POA: Diagnosis not present

## 2020-09-28 MED ORDER — TIRZEPATIDE 2.5 MG/0.5ML ~~LOC~~ SOAJ: 2.5000 mg | SUBCUTANEOUS | 0 refills | Status: AC

## 2020-09-28 MED ORDER — TIRZEPATIDE 5 MG/0.5ML ~~LOC~~ SOAJ: 5.0000 mg | SUBCUTANEOUS | 0 refills | Status: DC

## 2020-09-28 NOTE — Progress Notes (Signed)
Subjective:  Patient ID: Raven [name], female    DOB: 06-11-1990, 30 y.o.   MRN: [id number]  Patient Care Team: [name], DO as PCP - General (Family Medicine)   Chief Complaint:  Obesity   HPI: Raven [name] is a 30 y.o. female presenting on 09/28/2020 for Obesity   Pt presents today to discuss weight loss. BMI greater than 40. She has tried and failed Adipex therapy and weight watchers in the past. She was scheduled to have a gastric sleeve but got sick and surgery was cancelled. She tries to exercise and diet but feels she always plateaus at 230lbs. She admits to excessive caffeine use which can cause palpitations at times. She denies chest pain, shortness of breath, near syncope, or syncope. She has had an EKG in the past that revealed ST, no other abnormalities.      Relevant past medical, surgical, family, and social history reviewed and updated as indicated.  Allergies and medications reviewed and updated. Data reviewed: Chart in Epic.   Past Medical History:  Diagnosis Date   Complication of anesthesia    Elevated liver enzymes    Endometriosis    Medical history non-contributory    Otosclerosis    PONV (postoperative nausea and vomiting)     Past Surgical History:  Procedure Laterality Date   CESAREAN SECTION N/A 06/14/2014   Procedure: CESAREAN SECTION;  Surgeon: Bobbye Charleston, MD;  Location: [facility];  Service: Obstetrics;  Laterality: N/A;   EXCISION MASS ABDOMINAL N/A 10/21/2019   Procedure: EXCISION MASS ABDOMINAL;  Surgeon: Clovis Riley, MD;  Location: [facility];  Service: General;  Laterality: N/A;   stebactomy      Social History   Socioeconomic History   Marital status: Married    Spouse name: Not on file   Number of children: Not on file   Years of education: Not on file   Highest education level: Not on file  Occupational History   Not on file  Tobacco Use   Smoking status: Never   Smokeless tobacco: Never  Vaping  Use   Vaping Use: Never used  Substance and Sexual Activity   Alcohol use: No   Drug use: No   Sexual activity: Not on file  Other Topics Concern   Not on file  Social History Narrative   Not on file   Social Determinants of Health   Financial Resource Strain: Not on file  Food Insecurity: Not on file  Transportation Needs: Not on file  Physical Activity: Not on file  Stress: Not on file  Social Connections: Not on file  Intimate Partner Violence: Not on file    Outpatient Encounter Medications as of 09/28/2020  Medication Sig   levonorgestrel (MIRENA, 52 MG,) 20 MCG/24HR IUD 1 each by Intrauterine route once.    tirzepatide [facility]) 2.5 MG/0.5ML Pen Inject 2.5 mg into the skin once a week for 4 doses.   [START ON 09/27/2021] tirzepatide (MOUNJARO) 5 MG/0.5ML Pen Inject 5 mg into the skin once a week.   naproxen (NAPROSYN) 500 MG tablet Take 1 tablet (500 mg total) by mouth 2 (two) times daily with a meal. (Patient not taking: No sig reported)   No facility-administered encounter medications on file as of 09/28/2020.    Allergies  Allergen Reactions   Other     Walnuts - burns tongue    Plaquenil [Hydroxychloroquine] Rash   Sulfamethoxazole-Trimethoprim Rash    Review of Systems  Constitutional:  Negative for activity change, appetite change, chills, diaphoresis, fatigue, fever and unexpected weight change.  HENT: Negative.    Eyes: Negative.   Respiratory:  Negative for apnea, cough, choking, chest tightness, shortness of breath, wheezing and stridor.   Cardiovascular:  Positive for palpitations. Negative for chest pain and leg swelling.  Gastrointestinal:  Negative for abdominal distention, abdominal pain, anal bleeding, blood in stool, constipation, diarrhea, nausea and vomiting.  Endocrine: Negative.  Negative for cold intolerance, heat intolerance, polydipsia, polyphagia and polyuria.  Genitourinary:  Negative for dysuria, frequency and urgency.  Musculoskeletal:   Negative for arthralgias, back pain, gait problem, joint swelling, myalgias, neck pain and neck stiffness.  Skin: Negative.   Allergic/Immunologic: Negative.   Neurological:  Negative for dizziness, tremors, seizures, syncope, facial asymmetry, speech difficulty, weakness, light-headedness, numbness and headaches.  Hematological: Negative.   Psychiatric/Behavioral:  Negative for confusion, hallucinations, sleep disturbance and suicidal ideas.   All other systems reviewed and are negative.      Objective:  BP 127/86   Pulse (!) 102   Temp 98 F (36.7 C) (Temporal)   Resp 20   Ht _0  (1.626 m)   Wt 238 lb 4 oz (108.1 kg)   SpO2 98%   BMI 40.90 kg/m    Wt Readings from Last 3 Encounters:  09/28/20 238 lb 4 oz (108.1 kg)  09/24/20 238 lb (108 kg)  09/10/20 233 lb 12.8 oz (106.1 kg)    Physical Exam Vitals and nursing note reviewed.  Constitutional:      General: She is not in acute distress.    Appearance: Normal appearance. She is well-developed and well-groomed. She is obese. She is not ill-appearing, toxic-appearing or diaphoretic.  HENT:     Head: Normocephalic and atraumatic.     Jaw: There is normal jaw occlusion.     Right Ear: Hearing normal.     Left Ear: Hearing normal.     Nose: Nose normal.     Mouth/Throat:     Lips: Pink.     Mouth: Mucous membranes are moist.     Pharynx: Oropharynx is clear. Uvula midline.  Eyes:     General: Lids are normal.     Extraocular Movements: Extraocular movements intact.     Conjunctiva/sclera: Conjunctivae normal.     Pupils: Pupils are equal, round, and reactive to light.  Neck:     Thyroid: No thyroid mass, thyromegaly or thyroid tenderness.     Vascular: No carotid bruit or JVD.     Trachea: Trachea and phonation normal.  Cardiovascular:     Rate and Rhythm: Regular rhythm. Tachycardia present.     Chest Wall: PMI is not displaced.     Pulses: Normal pulses.     Heart sounds: Normal heart sounds. No murmur heard.    No friction rub. No gallop.  Pulmonary:     Effort: Pulmonary effort is normal. No respiratory distress.     Breath sounds: Normal breath sounds. No wheezing.  Abdominal:     General: Bowel sounds are normal. There is no distension or abdominal bruit.     Palpations: Abdomen is soft. There is no hepatomegaly or splenomegaly.     Tenderness: There is no abdominal tenderness. There is no right CVA tenderness or left CVA tenderness.     Hernia: No hernia is present.  Musculoskeletal:        General: Normal range of motion.     Cervical back: Normal range of motion and neck supple.  Right lower leg: No edema.     Left lower leg: No edema.  Lymphadenopathy:     Cervical: No cervical adenopathy.  Skin:    General: Skin is warm and dry.     Capillary Refill: Capillary refill takes less than 2 seconds.     Coloration: Skin is not cyanotic, jaundiced or pale.     Findings: No rash.  Neurological:     General: No focal deficit present.     Mental Status: She is alert and oriented to person, place, and time.     Cranial Nerves: Cranial nerves are intact. No cranial nerve deficit.     Sensory: Sensation is intact. No sensory deficit.     Motor: Motor function is intact. No weakness.     Coordination: Coordination is intact. Coordination normal.     Gait: Gait is intact. Gait normal.     Deep Tendon Reflexes: Reflexes are normal and symmetric. Reflexes normal.  Psychiatric:        Attention and Perception: Attention and perception normal.        Mood and Affect: Mood and affect normal.        Speech: Speech normal.        Behavior: Behavior normal. Behavior is cooperative.        Thought Content: Thought content normal.        Cognition and Memory: Cognition and memory normal.        Judgment: Judgment normal.    Results for orders placed or performed in visit on 07/25/20  Sedimentation rate  Result Value Ref Range   Sed Rate 19 0 - 20 mm/h  C-reactive protein  Result Value Ref  Range   CRP 3.8 <8.0 mg/L  RNP Antibody  Result Value Ref Range   Ribonucleic Protein(ENA) Antibody, IgG <1.0 NEG <1.0 NEG AI  Anti-Smith antibody  Result Value Ref Range   ENA SM Ab Ser-aCnc <1.0 NEG <1.0 NEG AI  Sjogrens syndrome-A extractable nuclear antibody  Result Value Ref Range   SSA (Ro) (ENA) Antibody, IgG <1.0 NEG <1.0 NEG AI  Sjogrens syndrome-B extractable nuclear antibody  Result Value Ref Range   SSB (La) (ENA) Antibody, IgG <1.0 NEG <1.0 NEG AI  Anti-DNA antibody, double-stranded  Result Value Ref Range   ds DNA Ab 1 IU/mL  CBC with Differential/Platelet  Result Value Ref Range   WBC 9.4 3.8 - 10.8 Thousand/uL   RBC 4.95 3.80 - 5.10 Million/uL   Hemoglobin 15.0 11.7 - 15.5 g/dL   HCT 45.8 (H) 35.0 - 45.0 %   MCV 92.5 80.0 - 100.0 fL   MCH 30.3 27.0 - 33.0 pg   MCHC 32.8 32.0 - 36.0 g/dL   RDW 12.4 11.0 - 15.0 %   Platelets 271 140 - 400 Thousand/uL   MPV 10.6 7.5 - 12.5 fL   Neutro Abs 5,931 1,500 - 7,800 cells/uL   Lymphs Abs 2,792 850 - 3,900 cells/uL   Absolute Monocytes 536 200 - 950 cells/uL   Eosinophils Absolute 103 15 - 500 cells/uL   Basophils Absolute 38 0 - 200 cells/uL   Neutrophils Relative % 63.1 %   Total Lymphocyte 29.7 %   Monocytes Relative 5.7 %   Eosinophils Relative 1.1 %   Basophils Relative 0.4 %  COMPLETE METABOLIC PANEL WITH GFR  Result Value Ref Range   Glucose, Bld 83 65 - 99 mg/dL   BUN 8 7 - 25 mg/dL   Creat 0.79 0.50 - 1.10 mg/dL  GFR, Est Non African American 100 > OR = 60 mL/min/1.41m   GFR, Est African American 116 > OR = 60 mL/min/1.740m  BUN/Creatinine Ratio NOT APPLICABLE 6 - 22 (calc)   Sodium 137 135 - 146 mmol/L   Potassium 4.7 3.5 - 5.3 mmol/L   Chloride 103 98 - 110 mmol/L   CO2 25 20 - 32 mmol/L   Calcium 9.9 8.6 - 10.2 mg/dL   Total Protein 7.6 6.1 - 8.1 g/dL   Albumin 4.6 3.6 - 5.1 g/dL   Globulin 3.0 1.9 - 3.7 g/dL (calc)   AG Ratio 1.5 1.0 - 2.5 (calc)   Total Bilirubin 0.4 0.2 - 1.2 mg/dL    Alkaline phosphatase (APISO) 72 31 - 125 U/L   AST 28 10 - 30 U/L   ALT 34 (H) 6 - 29 U/L     EKG in office: ST 103, no acute ST-T changes, no ectopy. QTc 417 ms, PR 154 ms.   Pertinent labs & imaging results that were available during my care of the patient were reviewed by me and considered in my medical decision making.  Assessment & Plan:  [place] seen today for obesity.  Diagnoses and all orders for this visit:  Morbid obesity ([place] and exercise discussed in detail. Has tried and failed Adipex therapy in the past. Has been on weight watchers with minimal results. Was scheduled to have gastric sleeve but became ill so was not able to have procedure. Would like to try Ozempic or Mounjaro. Labs pending. Side effects of medication discussed in detail. -     Thyroid Panel With TSH -     CBC with Differential/Platelet -     CMP14+EGFR -     Lipid panel -     tirzepatide (MOUNJARO) 2.5 MG/0.5ML Pen; Inject 2.5 mg into the skin once a week for 4 doses. -     tirzepatide ([facility]5 MG/0.5ML Pen; Inject 5 mg into the skin once a week.  Palpitations Admits to excessive caffeine. EKG revealed ST but no acute ST-T changes or ectopy. Will check thyroid function. Avoid stimulant and caffeine use. Report continued tachycardia. May require low dose BB or referral to cardiology if continues.  -     EKG 12-Lead -     Thyroid Panel With TSH  Encounter for weight loss counseling Encounter for weight management Diet and exercise discussed in detail. Has tried and failed Adipex therapy in the past. Has been on weight watchers with minimal results. Was scheduled to have gastric sleeve but became ill so was not able to have procedure. Would like to try Ozempic or Mounjaro. Labs pending.  -     EKG 12-Lead -     Thyroid Panel With TSH -     CBC with Differential/Platelet -     CMP14+EGFR -     Lipid panel -     tirzepatide (MOUNJARO) 2.5 MG/0.5ML Pen; Inject 2.5 mg into the skin once a  week for 4 doses. -     t[facility]epatide ([facility]5 MG/0.5ML Pen; Inject 5 mg into the skin once a week.     Continue all other maintenance medications.  Follow up plan: Return in about 6 weeks (around 11/09/2020) for weight management.   Continue healthy lifestyle choices, including diet (rich in fruits, vegetables, and lean proteins, and low in salt and simple carbohydrates) and exercise (at least 30 minutes of moderate physical activity daily).  Educational handout given for weight loss  The above assessment and management plan was discussed with the patient. The patient verbalized understanding of and has agreed to the management plan. Patient is aware to call the clinic if they develop any new symptoms or if symptoms persist or worsen. Patient is aware when to return to the clinic for a follow-up visit. Patient educated on when it is appropriate to go to the emergency department.   Monia Pouch, FNP-C [facility] [phone]

## 2020-09-28 NOTE — Telephone Encounter (Signed)
She will be using the savings card and not insurance. This needs to go to pharmacy without insurance.

## 2020-09-29 LAB — CMP14+EGFR
ALT: 28 IU/L (ref 0–32)
AST: 24 IU/L (ref 0–40)
Albumin/Globulin Ratio: 1.7 (ref 1.2–2.2)
Albumin: 4.2 g/dL (ref 3.8–4.8)
Alkaline Phosphatase: 73 IU/L (ref 44–121)
BUN/Creatinine Ratio: 13 (ref 9–23)
BUN: 10 mg/dL (ref 6–20)
Bilirubin Total: 0.2 mg/dL (ref 0.0–1.2)
CO2: 24 mmol/L (ref 20–29)
Calcium: 9.5 mg/dL (ref 8.7–10.2)
Chloride: 102 mmol/L (ref 96–106)
Creatinine, Ser: 0.79 mg/dL (ref 0.57–1.00)
Globulin, Total: 2.5 g/dL (ref 1.5–4.5)
Glucose: 120 mg/dL — ABNORMAL HIGH (ref 65–99)
Potassium: 4 mmol/L (ref 3.5–5.2)
Sodium: 141 mmol/L (ref 134–144)
Total Protein: 6.7 g/dL (ref 6.0–8.5)
eGFR: 102 mL/min/{1.73_m2} (ref >59)

## 2020-09-29 LAB — THYROID PANEL WITH TSH
Free Thyroxine Index: 1.5 (ref 1.2–4.9)
T3 Uptake Ratio: 24 % (ref 24–39)
T4, Total: 6.4 ug/dL (ref 4.5–12.0)
TSH: 1.24 u[IU]/mL (ref 0.450–4.500)

## 2020-09-29 LAB — CBC WITH DIFFERENTIAL/PLATELET
Basophils Absolute: 0.1 10*3/uL (ref 0.0–0.2)
Basos: 1 %
EOS (ABSOLUTE): 0.2 10*3/uL (ref 0.0–0.4)
Eos: 3 %
Hematocrit: 39.9 % (ref 34.0–46.6)
Hemoglobin: 13.5 g/dL (ref 11.1–15.9)
Immature Grans (Abs): 0 10*3/uL (ref 0.0–0.1)
Immature Granulocytes: 0 %
Lymphocytes Absolute: 2.3 10*3/uL (ref 0.7–3.1)
Lymphs: 30 %
MCH: 30.9 pg (ref 26.6–33.0)
MCHC: 33.8 g/dL (ref 31.5–35.7)
MCV: 91 fL (ref 79–97)
Monocytes Absolute: 0.5 10*3/uL (ref 0.1–0.9)
Monocytes: 6 %
Neutrophils Absolute: 4.6 10*3/uL (ref 1.4–7.0)
Neutrophils: 60 %
Platelets: 231 10*3/uL (ref 150–450)
RBC: 4.37 x10E6/uL (ref 3.77–5.28)
RDW: 12.1 % (ref 11.7–15.4)
WBC: 7.6 10*3/uL (ref 3.4–10.8)

## 2020-09-29 LAB — LIPID PANEL
Chol/HDL Ratio: 5.3 ratio — ABNORMAL HIGH (ref 0.0–4.4)
Cholesterol, Total: 176 mg/dL (ref 100–199)
HDL: 33 mg/dL — ABNORMAL LOW (ref >39)
LDL Chol Calc (NIH): 87 mg/dL (ref 0–99)
Triglycerides: 340 mg/dL — ABNORMAL HIGH (ref 0–149)
VLDL Cholesterol Cal: 56 mg/dL — ABNORMAL HIGH (ref 5–40)

## 2020-10-16 ENCOUNTER — Telehealth: Payer: Self-pay | Admitting: Internal Medicine

## 2020-10-16 NOTE — Telephone Encounter (Signed)
Patient calling to see if her Raven [name] labs have resulted? Patient had them drawn 10/05/20. Please call to advise.

## 2020-10-18 ENCOUNTER — Telehealth: Payer: Self-pay | Admitting: Family Medicine

## 2020-10-19 NOTE — Telephone Encounter (Signed)
I called patient, f/u appt scheduled. 01/21/2021.

## 2020-10-19 NOTE — Telephone Encounter (Signed)
Labs are negative for signs of lupus or rheumatoid arthritis. She does have one positive results that can be associated with abnormal blood clotting risks. I am not aware of any past blood clots or abnormal bleeding, but it would be recommended to recheck this result in 12 weeks to confirm if this is really positive. We should schedule a follow up around that time. I do not have any specific recommendation in the meantime besides continued as needed antiinflammatory medicines unless symptoms change significantly.

## 2020-10-22 ENCOUNTER — Ambulatory Visit: Payer: No Typology Code available for payment source | Admitting: Internal Medicine

## 2020-10-23 ENCOUNTER — Other Ambulatory Visit: Payer: Self-pay | Admitting: Family Medicine

## 2020-10-23 DIAGNOSIS — Z7689 Persons encountering health services in other specified circumstances: Secondary | ICD-10-CM

## 2020-10-23 DIAGNOSIS — Z713 Dietary counseling and surveillance: Secondary | ICD-10-CM

## 2020-10-26 ENCOUNTER — Telehealth: Payer: Self-pay | Admitting: Family Medicine

## 2020-11-02 NOTE — Telephone Encounter (Signed)
Raven [name] (Key: [id number]) Raven [name] 5MG /0.5ML pen-injectors     Status: Sent to Plan  Created: September 23rd, 2022  Sent: September 23rd, 202

## 2020-11-08 NOTE — Telephone Encounter (Signed)
Mounjaro denied by plan - fax received 11/05/20. Step therapy required

## 2020-11-09 ENCOUNTER — Ambulatory Visit: Payer: No Typology Code available for payment source | Admitting: Family Medicine

## 2020-11-09 NOTE — Telephone Encounter (Signed)
Please inform not covered.  Could try for Saxenda or [facility] when available next year.

## 2020-11-09 NOTE — Telephone Encounter (Signed)
Patient aware and is using coupon.

## 2020-11-12 MED ORDER — MOUNJARO 5 MG/0.5ML ~~LOC~~ SOAJ: SUBCUTANEOUS | 0 refills | Status: DC

## 2020-11-12 NOTE — Telephone Encounter (Signed)
Refilled failed. resent

## 2020-11-12 NOTE — Addendum Note (Signed)
Addended by: Antonietta Barcelona D on: 11/12/2020 07:49 AM   Modules accepted: Orders

## 2020-11-22 ENCOUNTER — Ambulatory Visit (INDEPENDENT_AMBULATORY_CARE_PROVIDER_SITE_OTHER): Payer: No Typology Code available for payment source | Admitting: Family Medicine

## 2020-11-22 ENCOUNTER — Encounter: Payer: Self-pay | Admitting: Family Medicine

## 2020-11-22 ENCOUNTER — Other Ambulatory Visit: Payer: Self-pay

## 2020-11-22 DIAGNOSIS — E8881 Metabolic syndrome: Secondary | ICD-10-CM

## 2020-11-22 DIAGNOSIS — E781 Pure hyperglyceridemia: Secondary | ICD-10-CM | POA: Diagnosis not present

## 2020-11-22 DIAGNOSIS — Z7689 Persons encountering health services in other specified circumstances: Secondary | ICD-10-CM

## 2020-11-22 DIAGNOSIS — R7301 Impaired fasting glucose: Secondary | ICD-10-CM

## 2020-11-22 DIAGNOSIS — N62 Hypertrophy of breast: Secondary | ICD-10-CM

## 2020-11-22 MED ORDER — MOUNJARO 5 MG/0.5ML ~~LOC~~ SOAJ: SUBCUTANEOUS | 12 refills | Status: DC

## 2020-11-22 NOTE — Progress Notes (Signed)
Subjective: CC: Follow-up obesity PCP: [name], DO ZOX:[id number] L Pardoe is a 30 y.o. female presenting to clinic today for:  1.  Morbid obesity with metabolic syndrome, hypertriglyceridemia and impaired fasting glucose Patient was started on Mounjaro.  She has been on this medication since August and is now up to 5 mg subcutaneously each week.  She is doing very well with the medication and has lost from 238 pounds down to 224 pounds.  She denies any abnormal or unwanted side effects such as abdominal pain, nausea, vomiting, excessive dry mouth.  2.  Macromastia Patient is interested in pursuing breast reduction surgery.  She is always had very large breasts and reports having back pain with simple things like standing at a counter.  She has shoulder indentations from the weight of her breasts.  She often experiences chafing and rashes underneath her breasts.  She currently wears a size G bra.  She admits to snoring and having to lie on her side in efforts to breathe easily at nighttime.   ROS: Per HPI  Allergies  Allergen Reactions   Other     Walnuts - burns tongue    Plaquenil [Hydroxychloroquine] Rash   Sulfamethoxazole-Trimethoprim Rash   Past Medical History:  Diagnosis Date   Complication of anesthesia    Elevated liver enzymes    Endometriosis    Medical history non-contributory    Otosclerosis    PONV (postoperative nausea and vomiting)     Current Outpatient Medications:    levonorgestrel (MIRENA, 52 MG,) 20 MCG/24HR IUD, 1 each by Intrauterine route once. , Disp: , Rfl:    tirzepatide (MOUNJARO) 5 MG/0.5ML Pen, INJECT 5 MG INTO THE SKIN ONCE A WEEK., Disp: 2 mL, Rfl: 0 Social History   Socioeconomic History   Marital status: Married    Spouse name: Not on file   Number of children: Not on file   Years of education: Not on file   Highest education level: Not on file  Occupational History   Not on file  Tobacco Use   Smoking status: Never    Smokeless tobacco: Never  Vaping Use   Vaping Use: Never used  Substance and Sexual Activity   Alcohol use: No   Drug use: No   Sexual activity: Not on file  Other Topics Concern   Not on file  Social History Narrative   Not on file   Social Determinants of Health   Financial Resource Strain: Not on file  Food Insecurity: Not on file  Transportation Needs: Not on file  Physical Activity: Not on file  Stress: Not on file  Social Connections: Not on file  Intimate Partner Violence: Not on file   Family History  Problem Relation Age of Onset   Other Mother        Elevated liver enzymes   Diabetes Mother    Hypertension Mother    Thyroid disease Mother    Diabetes Father    Hypertension Father    Hyperlipidemia Father    Other Brother        Elevated liver enzymes   Asthma Paternal Aunt    Asthma Paternal Grandmother    Healthy Son    Allergic rhinitis Neg Hx    Eczema Neg Hx    Urticaria Neg Hx     Objective: Office vital signs reviewed. BP 126/86   Pulse 100   Temp 98.1 F (36.7 C)   Ht 5\' 4"  (1.626 m)   Wt 224  lb (101.6 kg)   SpO2 99%   BMI 38.45 kg/m   Physical Examination:  General: Awake, alert, obese but well-appearing, No acute distress HEENT: Normal; sclera white. Cardio: regular rate and rhythm, S1S2 heard, no murmurs appreciated Pulm: clear to auscultation bilaterally, no wheezes, rhonchi or rales; normal work of breathing on room air Breast: Macromastia present  Assessment/ Plan: 30 y.o. female   Morbid obesity ([place]) - Plan: tirzepatide (MOUNJARO) 5 MG/0.5ML Pen  Impaired fasting glucose - Plan: tirzepatide (MOUNJARO) 5 MG/0.5ML Pen  Encounter for weight management - Plan: tirzepatide (MOUNJARO) 5 MG/0.5ML Pen  Hypertriglyceridemia - Plan: tirzepatide (MOUNJARO) 5 [place]/7.0WC Pen  Metabolic syndrome - Plan: tirzepatide (MOUNJARO) 5 MG/0.5ML Pen  Macromastia - Plan: Ambulatory referral to [place] she is having  excellent response to this and no side effects.  I think that she certainly benefits from this GLP class given metabolic syndrome with evidence of hypertriglyceridemia, impaired fasting glucose and central obesity.  We discussed that [facility] technically is indicated in diabetes only at this time but this should be getting the indication for weight loss soon.  There are some new limitations on the coupon which unfortunately she may feel prior to the approval for bariatric population.  If this occurs, we may consider alternative therapy such as Wegovy or Saxenda  Referral to breast surgery for macromastia placed.  Certainly think that she would be a good candidate for breast reduction surgery.  I agree with continued pursuance of weight loss in preparation for said surgery to reduce cardiovascular risk.  Her reports today are certainly suggestive of a possible OHS due to breast weight.  No orders of the defined types were placed in this encounter.  No orders of the defined types were placed in this encounter.    [name], DO [place] [phone]

## 2020-11-26 ENCOUNTER — Encounter: Payer: Self-pay | Admitting: Family Medicine

## 2020-12-13 ENCOUNTER — Encounter: Payer: Self-pay | Admitting: Family Medicine

## 2020-12-17 ENCOUNTER — Other Ambulatory Visit: Payer: Self-pay | Admitting: Family Medicine

## 2020-12-17 DIAGNOSIS — R7301 Impaired fasting glucose: Secondary | ICD-10-CM

## 2020-12-17 DIAGNOSIS — E781 Pure hyperglyceridemia: Secondary | ICD-10-CM

## 2020-12-17 DIAGNOSIS — E8881 Metabolic syndrome: Secondary | ICD-10-CM

## 2020-12-17 MED ORDER — TIRZEPATIDE 7.5 MG/0.5ML ~~LOC~~ SOAJ: 7.5000 mg | SUBCUTANEOUS | 3 refills | Status: DC

## 2021-01-16 ENCOUNTER — Telehealth: Payer: Self-pay | Admitting: *Deleted

## 2021-01-16 DIAGNOSIS — R768 Other specified abnormal immunological findings in serum: Secondary | ICD-10-CM

## 2021-01-16 NOTE — Telephone Encounter (Signed)
Patient advised the only result that we really needed repeated is the phosphatidylserine/prothrombin antibodies, she had positive IgM result in August. We can check this lab here now with [facility] can just come for the lab doesn't need visit unless for other problems. Patient expressed understanding.

## 2021-01-16 NOTE — Telephone Encounter (Signed)
The only result that we really needed repeated is the phosphatidylserine/prothrombin antibodies, she had positive IgM result in August. We can check this lab here now with [facility] can just come for the lab doesn't need visit unless for other problems.

## 2021-01-16 NOTE — Telephone Encounter (Signed)
Patient states she has an appointment on 01/21/2021. Patient states she has not had the repeat AVISE labs done. Would you like her to reschedule until after they have been completed? Please advise.

## 2021-01-17 NOTE — Addendum Note (Signed)
Addended by: Carole Binning on: 01/17/2021 03:44 PM   Modules accepted: Orders

## 2021-01-18 ENCOUNTER — Other Ambulatory Visit: Payer: Self-pay

## 2021-01-18 DIAGNOSIS — R768 Other specified abnormal immunological findings in serum: Secondary | ICD-10-CM

## 2021-01-21 ENCOUNTER — Ambulatory Visit: Payer: No Typology Code available for payment source | Admitting: Internal Medicine

## 2021-01-23 LAB — PHOSPHATIDYLSERINE/PROTHROMBIN (PS/PT) ANTIBODIES (IGG, IGM)
Phosphatidylserine/Prothrombin Ab (IgG): 9 U (ref <=30)
Phosphatidylserine/Prothrombin Ab (IgM): 21 U (ref <=30)

## 2021-01-28 NOTE — Progress Notes (Signed)
Antibody tests are negative on repeat testing which is great. Previous positive result may have been due to some inflammatory response or process going on at that time. I do recommend any new medication at this time. We can follow up as needed if joint pain and swelling is getting worse again not controlled with meloxicam/other NSAIDs.

## 2021-03-01 ENCOUNTER — Institutional Professional Consult (permissible substitution): Payer: No Typology Code available for payment source | Admitting: Plastic Surgery

## 2021-03-28 ENCOUNTER — Telehealth: Payer: No Typology Code available for payment source

## 2021-05-13 IMAGING — MR MR ABDOMEN WO/W CM
16 of 17 series · 45 of 48 positions shown · IV contrast (10ml Gadavist)
Comparison: 08/21/2019 CT abdomen/pelvis.

CLINICAL DATA: Ventral left pelvic wall soft tissue mass on CT.
Left lower quadrant pain for 3 months. Elevated liver function
tests. History of cesarean section 06/14/2014.

EXAM:
MRI ABDOMEN AND PELVIS WITHOUT AND WITH CONTRAST
TECHNIQUE: Multiplanar multisequence MR imaging of the abdomen and pelvis was
performed both before and after the administration of intravenous
contrast.
CONTRAST:  10mL GADAVIST GADOBUTROL 1 MMOL/ML IV SOLN

[Series 2: T2 · coronal · 6.0mm · 1.19mm/px · 2 of 37 slices shown (1 of 2)]
[im 1/37]
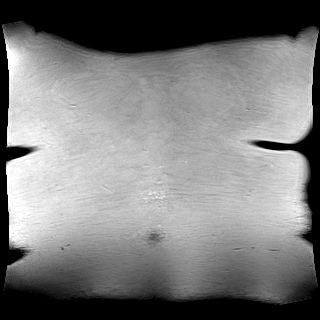
[im 37/37]
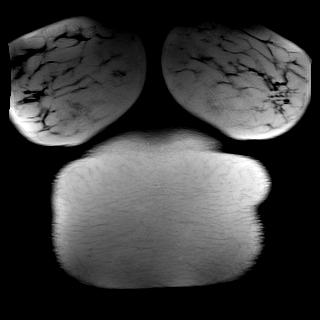

[Series 3: T2 · axial · 6.0mm · 1.25mm/px · z∈[-146,+113]mm · 2 of 37 slices shown (2 of 2)]
[im 1/37]
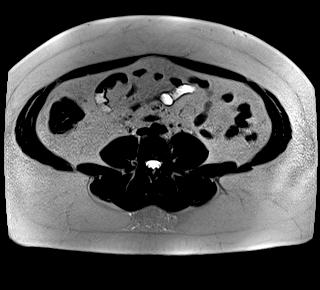
[im 37/37]
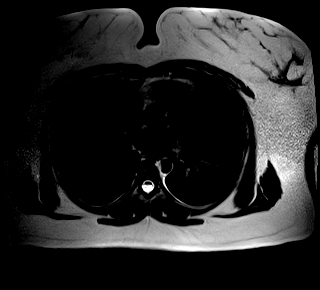

[Series 6: T2 fat-sat · axial · 6.0mm · 1.25mm/px · z∈[-139,+120]mm · 2 of 37 slices shown]
[im 1/37]
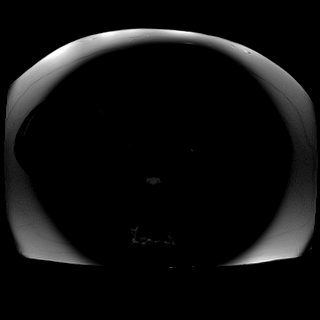
[im 37/37]
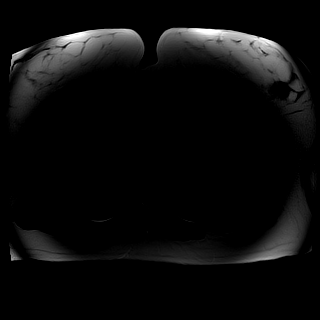

[Series 7: ax dwi_tracew · axial · 6.0mm · 1.49mm/px · z∈[-143,+124]mm · 5 of 114 slices shown]
[im 1/114]
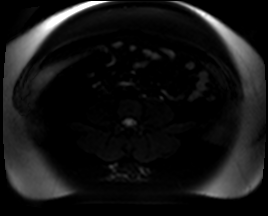
[im 29/114]
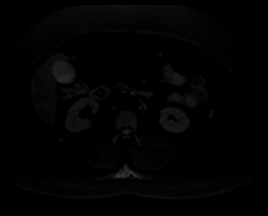
[im 57/114]
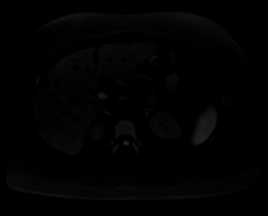
[im 85/114]
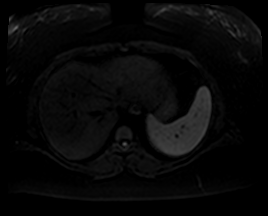
[im 114/114]
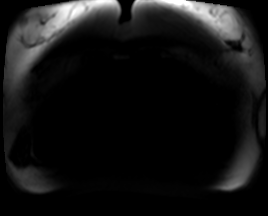

[Series 8: ax dwi_adc · axial · 6.0mm · 1.49mm/px · z∈[-143,+124]mm · 2 of 38 slices shown]
[im 1/38]
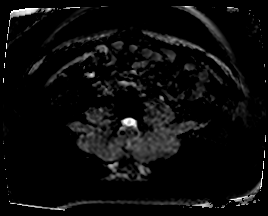
[im 38/38]
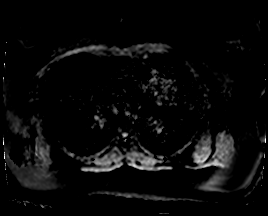

[Series 9: T1 · axial · 6.0mm · 0.78mm/px · z∈[-146,+113]mm · 3 of 74 slices shown]
[im 1/74]
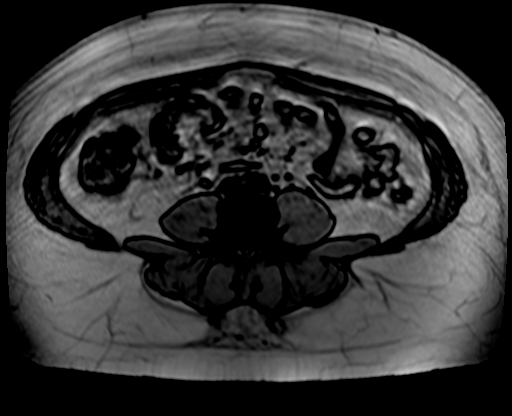
[im 37/74]
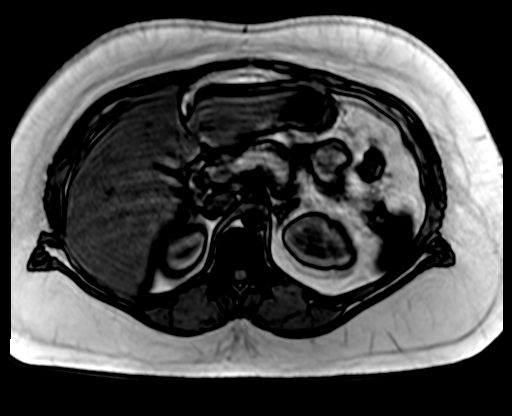
[im 74/74]
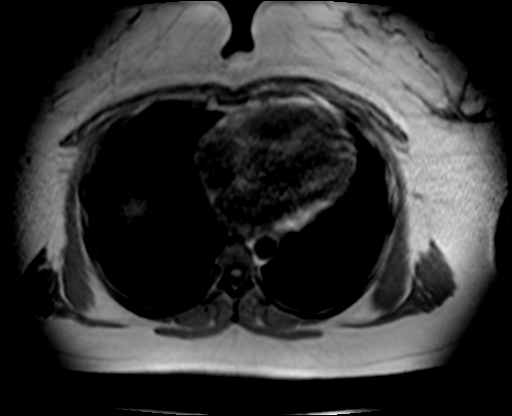

[Series 10: bSSFP · axial · 6.0mm · 0.78mm/px · z∈[-146,+113]mm · 2 of 37 slices shown]
[im 1/37]
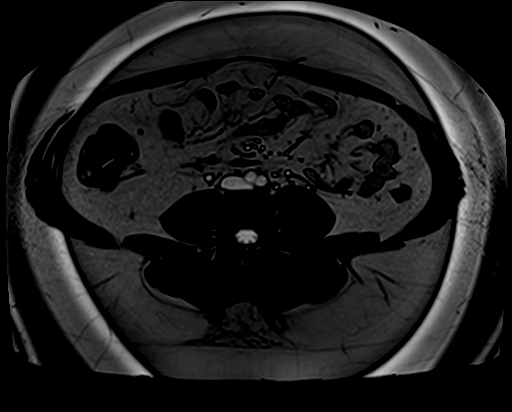
[im 37/37]
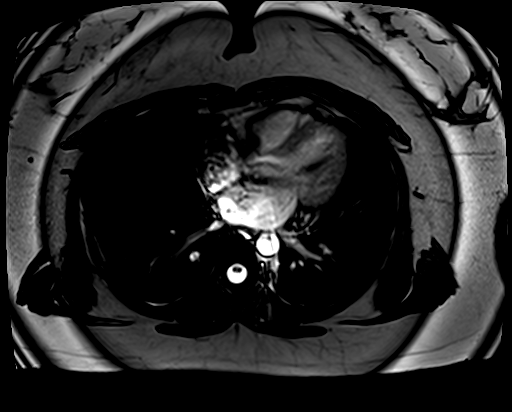

[Series 22: T1 dynamic fat-sat · axial · non-contrast · 3.0mm · 1.25mm/px · z∈[-119,+118]mm · 3 of 80 slices shown (1 of 4)]
[im 1/80]
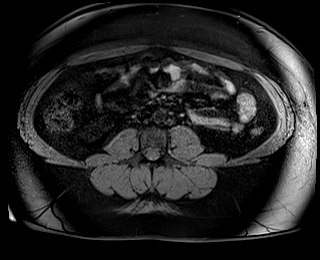
[im 40/80]
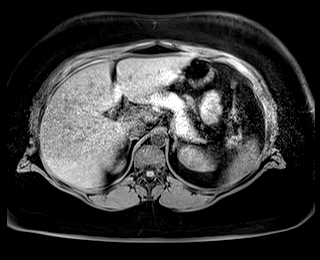
[im 80/80]
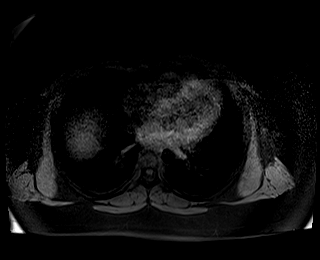

[Series 23: T1 dynamic fat-sat post-contrast · axial · 3.0mm · 1.25mm/px · z∈[-119,+118]mm · 3 of 80 slices shown (1 of 4)]
[im 1/80]
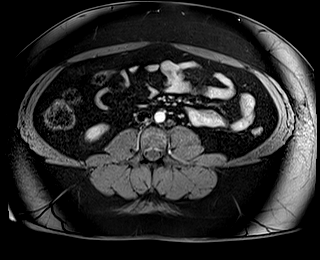
[im 40/80]
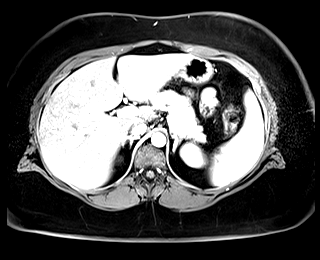
[im 80/80]
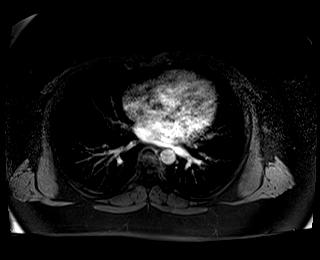

[Series 24: T1 dynamic fat-sat · axial · 3.0mm · 1.25mm/px · z∈[-119,+118]mm · 3 of 80 slices shown (2 of 4)]
[im 1/80]
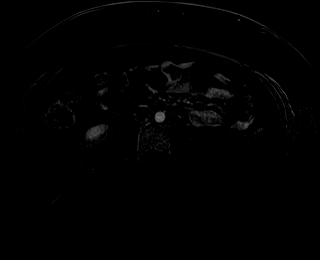
[im 40/80]
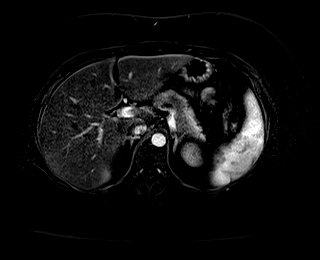
[im 80/80]
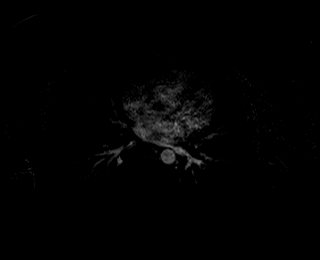

[Series 25: T1 dynamic fat-sat post-contrast · axial · 3.0mm · 1.25mm/px · z∈[-119,+118]mm · 3 of 80 slices shown (2 of 4)]
[im 1/80]
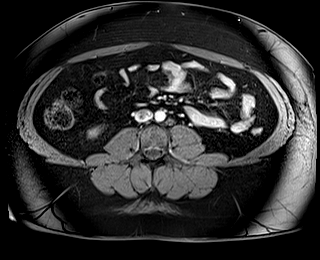
[im 40/80]
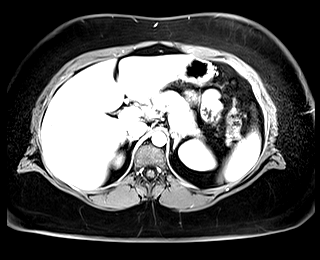
[im 80/80]
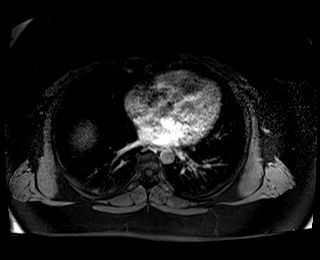

[Series 26: T1 dynamic fat-sat · axial · 3.0mm · 1.25mm/px · z∈[-119,+118]mm · 3 of 80 slices shown (3 of 4)]
[im 1/80]
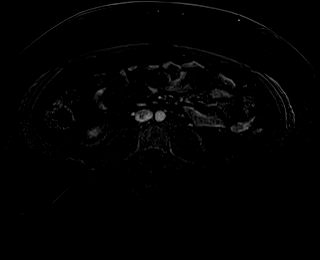
[im 40/80]
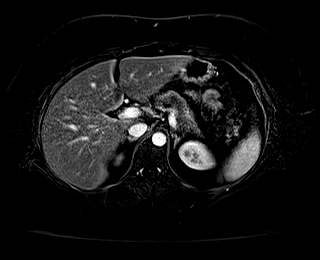
[im 80/80]
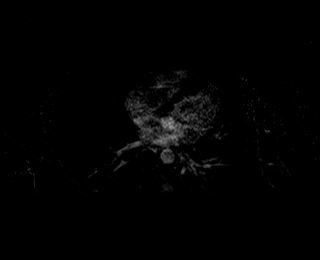

[Series 27: T1 dynamic fat-sat post-contrast · axial · 3.0mm · 1.25mm/px · z∈[-119,+118]mm · 3 of 80 slices shown (3 of 4)]
[im 1/80]
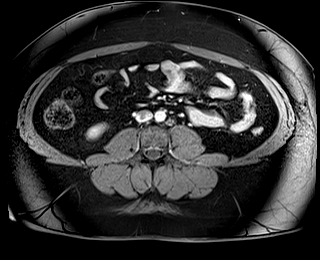
[im 40/80]
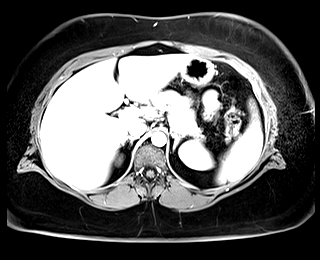
[im 80/80]
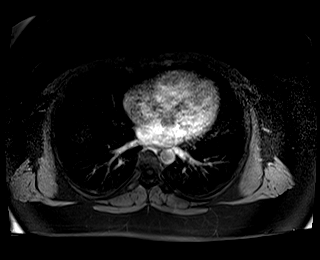

[Series 28: T1 dynamic fat-sat · axial · 3.0mm · 1.25mm/px · z∈[-119,+118]mm · 3 of 80 slices shown (4 of 4)]
[im 1/80]
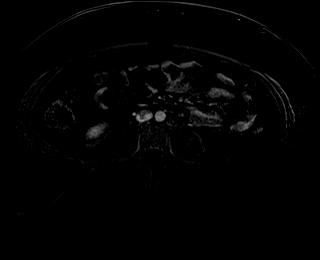
[im 40/80]
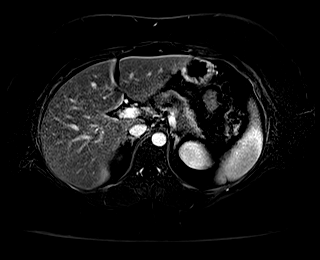
[im 80/80]
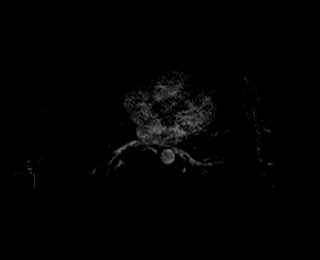

[Series 29: T1 dynamic post-contrast · coronal · 3.0mm · 1.31mm/px · 3 of 72 slices shown]
[im 1/72]
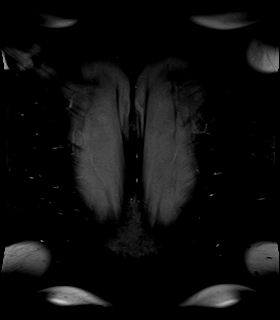
[im 36/72]
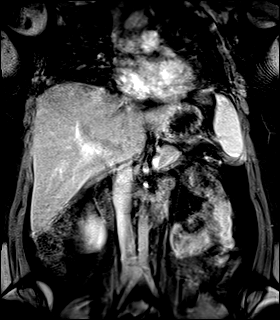
[im 72/72]
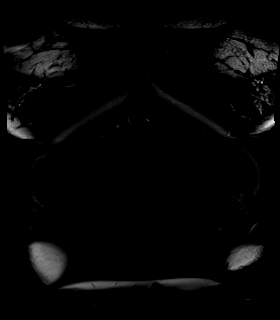

[Series 30: T1 dynamic fat-sat post-contrast · axial · 3.0mm · 1.25mm/px · z∈[-119,+118]mm · 3 of 80 slices shown (4 of 4)]
[im 1/80]
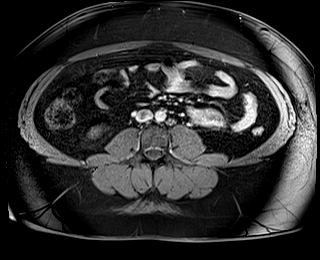
[im 40/80]
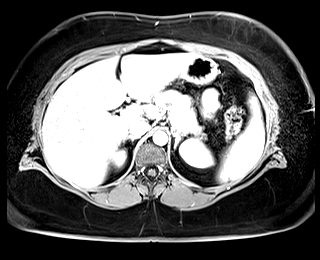
[im 80/80]
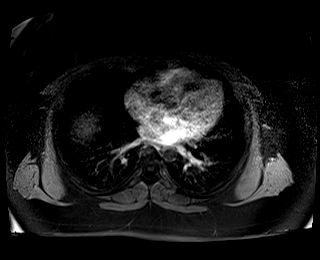

[45 of 48 positions shown; findings below may reference images not displayed]

FINDINGS: COMBINED FINDINGS FOR BOTH MR ABDOMEN AND PELVIS

Lower chest: No acute abnormality at the lung bases.

Hepatobiliary: Normal liver size and configuration. Mild diffuse
hepatic steatosis. No liver mass. Normal gallbladder with no
cholelithiasis. No biliary ductal dilatation. Common bile duct
diameter 2 mm. No choledocholithiasis. No biliary strictures, masses
or beading.

Pancreas: No pancreatic mass or duct dilation.  No pancreas divisum.

Spleen: Normal size. No mass.

Adrenals/Urinary Tract: Normal adrenals. No hydronephrosis. Normal
kidneys with no renal mass.

Stomach/Bowel: Normal non-distended stomach. No dilated or
thick-walled bowel loops.

Vascular/Lymphatic: Normal caliber abdominal aorta. Patent portal,
splenic, hepatic and renal veins. No pathologically enlarged lymph
nodes in the abdomen or pelvis.

Reproductive: Anteverted uterus is normal in size and configuration
and measures 9.3 x 3.3 x 4.7 cm. No uterine fibroids. Inner
myometrial thickness (junctional zone) 5 mm, within normal limits.
Bilayer endometrial thickness 4 mm, within normal limits. No
endometrial cavity fluid or focal endometrial mass. Subcentimeter
simple nabothian cyst at the external left cervical os. Otherwise
normal uterine cervix. IUD is well positioned in the uterine cavity.

Right ovary measures 3.4 x 2.8 x 2.0 cm. Left ovary measures 3.7 x
1.9 x 1.9 cm. No abnormal ovarian or adnexal masses.

Other: No abdominal ascites or focal fluid collection. There is a
poorly marginated 3.2 x 2.7 x 3.2 cm mass in the deep subcutaneous
ventral left pelvic wall (series 15/image 17), intimately involving
the superficial portion of ventral left pelvic muscle wall, with
numerous tiny internal cystic spaces and with avid enhancement,
previously 3.1 x 2.6 cm on 06/10/2019 CT abdomen/pelvis using
similar measurement technique, not appreciably changed.

Musculoskeletal: No aggressive appearing focal osseous lesions.
IMPRESSION: 1. Poorly marginated 3.2 x 2.7 x 3.2 cm enhancing mass in the deep
subcutaneous ventral left pelvic wall, intimately involving the
superficial portion of the ventral left pelvic muscle wall, with
numerous tiny internal cystic spaces, not appreciably changed in
size compared to 06/10/2019 CT abdomen/pelvis. Cesarean scar
endometriosis is strongly favored given the history of cesarean
section.
2. IUD well positioned in the uterine cavity.
3. Mild diffuse hepatic steatosis.

## 2021-06-15 ENCOUNTER — Emergency Department (HOSPITAL_BASED_OUTPATIENT_CLINIC_OR_DEPARTMENT_OTHER)
Admission: EM | Admit: 2021-06-15 | Discharge: 2021-06-15 | Disposition: A | Discharge status: Home / Self Care | Payer: No Typology Code available for payment source | Attending: Emergency Medicine | Admitting: Emergency Medicine

## 2021-06-15 ENCOUNTER — Encounter (HOSPITAL_BASED_OUTPATIENT_CLINIC_OR_DEPARTMENT_OTHER): Payer: Self-pay

## 2021-06-15 ENCOUNTER — Other Ambulatory Visit: Payer: Self-pay

## 2021-06-15 DIAGNOSIS — N9489 Other specified conditions associated with female genital organs and menstrual cycle: Secondary | ICD-10-CM | POA: Insufficient documentation

## 2021-06-15 DIAGNOSIS — O219 Vomiting of pregnancy, unspecified: Secondary | ICD-10-CM | POA: Diagnosis not present

## 2021-06-15 DIAGNOSIS — Z3A01 Less than 8 weeks gestation of pregnancy: Secondary | ICD-10-CM | POA: Diagnosis not present

## 2021-06-15 LAB — URINALYSIS, ROUTINE W REFLEX MICROSCOPIC
Bilirubin Urine: NEGATIVE
Glucose, UA: NEGATIVE mg/dL
Hgb urine dipstick: NEGATIVE
Ketones, ur: NEGATIVE mg/dL
Nitrite: NEGATIVE
Specific Gravity, Urine: 1.03 (ref 1.005–1.030)
pH: 6 (ref 5.0–8.0)

## 2021-06-15 LAB — HCG, QUANTITATIVE, PREGNANCY: hCG, Beta Chain, Quant, S: 52015 m[IU]/mL — ABNORMAL HIGH (ref <5)

## 2021-06-15 MED ORDER — METOCLOPRAMIDE HCL 5 MG/ML IJ SOLN
5.0000 mg | Freq: Once | INTRAMUSCULAR | Status: AC
  Administered 2021-06-15: 5 mg via INTRAVENOUS
  Filled 2021-06-15: qty 2

## 2021-06-15 MED ORDER — SODIUM CHLORIDE 0.9 % IV BOLUS
1000.0000 mL | Freq: Once | INTRAVENOUS | Status: AC
Start: 2021-06-15 — End: 2021-06-15
  Administered 2021-06-15: 1000 mL via INTRAVENOUS

## 2021-06-15 MED ORDER — PROMETHAZINE HCL 25 MG PO TABS: 25.0000 mg | ORAL_TABLET | Freq: Four times a day (QID) | ORAL | 0 refills | Status: DC | PRN

## 2021-06-15 NOTE — Discharge Instructions (Signed)
You were seen today with vomiting during pregnancy.  Continue B6 and Unisom.  Take Phenergan as needed.  Make sure that you are staying hydrated.  Urine culture was sent.  If this grows out anything, you will be called.  Follow-up with OB/GYN. ?

## 2021-06-15 NOTE — ED Triage Notes (Signed)
Nausea and vomiting beginning around 7PM last night. Estimates vomiting every 30 minutes since then. Approx [redacted] weeks pregnant. Scheduled for first OB US next Monday. Seen at [facility]. Second pregnancy.  ?

## 2021-06-15 NOTE — ED Provider Notes (Signed)
?[facility] ?Provider [name] ? ? ?CSN: [id number] ?Arrival date & time: 06/15/21  0341 ? ?  ? ?History ? ?Chief Complaint  ?Patient presents with  ? Emesis During Pregnancy  ? ? ?Raven [name] is a 31 y.o. female. ? ?HPI ? ?  ? ?This is a 31 year old G2, P21 female currently just over [redacted] weeks pregnant who presents with nausea vomiting.  Patient reports that she has been taking B6 and Unisom with some relief; however last night began to vomit around 8 PM.  She reports multiple episodes of nonbilious, nonbloody emesis.  She has not been able to keep anything down.  She is now dry heaving.  She denies abdominal pain, loss of fluids, vaginal bleeding.  Last menstrual period was March 22. ? ?Home Medications ?Prior to Admission medications   ?Medication Sig Start Date End Date Taking? Authorizing Provider  ?promethazine (PHENERGAN) 25 MG tablet Take 1 tablet (25 mg total) by mouth every 6 (six) hours as needed for nausea or vomiting. 06/15/21  Yes Meshell Abdulaziz, Barbette Hair, MD  ?tirzepatide [facility]) 7.5 MG/0.5ML Pen Inject 7.5 mg into the skin once a week. 12/17/20   [name], DO  ?levonorgestrel (MIRENA, 52 MG,) 20 MCG/24HR IUD 1 each by Intrauterine route once.     [provider]  ?   ? ?Allergies    ?Other, Hydroxychloroquine, and Sulfamethoxazole-trimethoprim   ? ?Review of Systems   ?Review of Systems  ?Gastrointestinal:  Positive for nausea and vomiting.  ?Genitourinary:  Negative for vaginal bleeding.  ?All other systems reviewed and are negative. ? ?Physical Exam ?Updated Vital Signs ?BP 120/77   Pulse (!) 110   Temp 98.4 ?F (36.9 ?C)   Resp 20   Ht 1.626 m ('5\' 4"'$ )   Wt 99.3 kg   LMP  (LMP Unknown)   SpO2 98%   BMI 37.59 kg/m?  ?Physical Exam ?Vitals and nursing [name] reviewed.  ?Constitutional:   ?   Appearance: She is well-developed. She is obese. She is not ill-appearing.  ?HENT:  ?   Head: Normocephalic and atraumatic.  ?   Mouth/Throat:  ?   Mouth: Mucous  membranes are dry.  ?Eyes:  ?   Pupils: Pupils are equal, round, and reactive to light.  ?Cardiovascular:  ?   Rate and Rhythm: Normal rate and regular rhythm.  ?   Heart sounds: Normal heart sounds.  ?Pulmonary:  ?   Effort: Pulmonary effort is normal. No respiratory distress.  ?   Breath sounds: No wheezing.  ?Abdominal:  ?   General: Bowel sounds are normal.  ?   Palpations: Abdomen is soft.  ?   Tenderness: Raven is no abdominal tenderness. Raven is no guarding or rebound.  ?Musculoskeletal:  ?   Cervical back: Neck supple.  ?Skin: ?   General: Skin is warm and dry.  ?Neurological:  ?   Mental Status: She is alert and oriented to person, place, and time.  ?Psychiatric:     ?   Mood and Affect: Mood normal.  ? ? ?ED Results / Procedures / Treatments   ?Labs ?(all labs ordered are listed, but only abnormal results are displayed) ?Labs Reviewed  ?HCG, QUANTITATIVE, PREGNANCY - Abnormal; Notable for the following components:  ?    Result Value  ? hCG, Beta Chain, Quant, S 52,015 (*)   ? All other components within normal limits  ?URINALYSIS, ROUTINE W REFLEX MICROSCOPIC - Abnormal; Notable for the following components:  ? APPearance  HAZY (*)   ? Protein, ur TRACE (*)   ? Leukocytes,Ua SMALL (*)   ? Bacteria, UA MANY (*)   ? All other components within normal limits  ?URINE CULTURE  ? ? ?EKG ?None ? ?Radiology ?No results found. ? ?Procedures ?Procedures  ? ? ?Medications Ordered in ED ?Medications  ?sodium chloride 0.9 % bolus 1,000 mL (0 mLs Intravenous Stopped 06/15/21 0628)  ?metoCLOPramide (REGLAN) injection 5 mg (5 mg Intravenous Given 06/15/21 0448)  ? ? ?ED Course/ Medical Decision Making/ A&P ?  ?                        ?Medical Decision Making ?Amount and/or Complexity of Data Reviewed ?Labs: ordered. ? ?Risk ?Prescription drug management. ? ? ?This patient presents to the ED for concern of vomiting during pregnancy, this involves an extensive number of treatment options, and is a complaint that carries with it  a high risk of complications and morbidity.  The differential diagnosis includes morning sickness, hyperemesis, gastroenteritis ? ?MDM:   ? ?This is a 31 year old G38 P1 female who presents with vomiting out of pregnancy.  She has yet to have a confirmatory ultrasound.  She is nontoxic.  She denies abdominal pain or loss of fluids.  Patient was given fluids.  Urinalysis obtained.  No significant ketones.  She does have 6-10 white cells and many bacteria.  Given bacteriuria, will send culture as she is pregnant.  She is afebrile and does not have any infectious symptoms.  Low suspicion of pregnancy complications such as ectopic.  Patient states she feels much better after fluids and antiemetic.  Will prescribe a short course of Phenergan for breakthrough nausea and vomiting.  Recommend close follow-up with OB/GYN.  Patient stated understanding. ? ?Admission considered for intractable vomiting ?(Labs, imaging, consults) ? ?Labs: ?I Ordered, and personally interpreted labs.  The pertinent results include: Urinalysis ? ?Imaging Studies ordered: ?I ordered imaging studies including none ?I independently visualized and interpreted imaging. ?I agree with the radiologist interpretation ? ?Additional history obtained from chart review.  External records from outside source obtained and reviewed including Prior obstetric history ? ?Cardiac Monitoring: ?The patient was maintained on a cardiac monitor.  I personally viewed and interpreted the cardiac monitored which showed an underlying rhythm of: Sinus tachycardia ? ?Reevaluation: ?After the interventions noted above, I reevaluated the patient and found that they have :improved ? ?Social Determinants of Health: ?Lives independently ? ?Disposition: Discharge ? ?Co morbidities that complicate the patient evaluation ? ?Past Medical History:  ?Diagnosis Date  ? Complication of anesthesia   ? Elevated liver enzymes   ? Endometriosis   ? Medical history non-contributory   ?  Otosclerosis   ? PONV (postoperative nausea and vomiting)   ?  ? ?Medicines ?Meds ordered this encounter  ?Medications  ? sodium chloride 0.9 % bolus 1,000 mL  ? metoCLOPramide (REGLAN) injection 5 mg  ? promethazine (PHENERGAN) 25 MG tablet  ?  Sig: Take 1 tablet (25 mg total) by mouth every 6 (six) hours as needed for nausea or vomiting.  ?  Dispense:  30 tablet  ?  Refill:  0  ?  ?I have reviewed the patients home medicines and have made adjustments as needed ? ?Problem List / ED Course: ?Problem List Items Addressed This Visit   ?None ?Visit Diagnoses   ? ? Vomiting affecting pregnancy    -  Primary  ? ?  ?  ? ? ? ? ? ? ? ? ? ? ? ? ?  Final Clinical Impression(s) / ED Diagnoses ?Final diagnoses:  ?Vomiting affecting pregnancy  ? ? ?Rx / DC Orders ?ED Discharge Orders   ? ?      Ordered  ?  promethazine (PHENERGAN) 25 MG tablet  Every 6 hours PRN       ? 06/15/21 0631  ? ?  ?  ? ?  ? ? ?  ?Merryl Hacker, MD ?06/15/21 [phone] ? ?

## 2021-06-16 LAB — URINE CULTURE: Culture: NO GROWTH

## 2021-06-28 LAB — OB RESULTS CONSOLE HEPATITIS B SURFACE ANTIGEN: Hepatitis B Surface Ag: NEGATIVE

## 2021-06-28 LAB — HEPATITIS C ANTIBODY: HCV Ab: NEGATIVE

## 2021-06-28 LAB — OB RESULTS CONSOLE RUBELLA ANTIBODY, IGM: Rubella: IMMUNE

## 2021-06-28 LAB — OB RESULTS CONSOLE ANTIBODY SCREEN: Antibody Screen: NEGATIVE

## 2021-06-28 LAB — OB RESULTS CONSOLE HIV ANTIBODY (ROUTINE TESTING): HIV: NONREACTIVE

## 2021-06-28 LAB — OB RESULTS CONSOLE RPR: RPR: NONREACTIVE

## 2021-06-28 LAB — OB RESULTS CONSOLE ABO/RH: RH Type: POSITIVE

## 2021-07-18 ENCOUNTER — Other Ambulatory Visit (HOSPITAL_COMMUNITY): Payer: Self-pay

## 2021-07-18 LAB — OB RESULTS CONSOLE GC/CHLAMYDIA
Chlamydia: NEGATIVE
Neisseria Gonorrhea: NEGATIVE

## 2021-07-18 MED ORDER — ASPIRIN 81 MG PO TBEC
81.0000 mg | DELAYED_RELEASE_TABLET | Freq: Every day | ORAL | 7 refills | Status: DC
  Filled 2021-07-18: qty 30, 30d supply, fill #0

## 2021-07-26 ENCOUNTER — Other Ambulatory Visit (HOSPITAL_COMMUNITY): Payer: Self-pay

## 2021-08-21 NOTE — Progress Notes (Signed)
Cardio-Obstetrics Clinic  New Evaluation  Date:  08/23/2021   ID:  Raven [name], DOB [birth year], MRN [id number]  PCP:  [name], DO   [facility] Providers Cardiologist:  None  Electrophysiologist:  None     Referring MD: [name], DO   Chief Complaint: Palpitations  History of Present Illness:    Raven [name] is a 31 y.o. female [G2P1000] who is being seen today for the evaluation of palpitations at the request of Morris, Megan, DO.   Today, the patient is currently [redacted] weeks pregnant. She had been doing well in pregnancy until about 1 month ago when she developed significant palpitations with associated SOB. Specifically, the patient states that she was [place] the [place] hiking and while walking, her heart began to race. She started to feel very short of breath such that she had to take several breaks to catch her breath before making it to the top. When she reached the top, she was barely able to get out a full sentence due to the degree of her SOB. Since that time, she has not had as significant symptoms but she is noting her heart rate can climb to 140s with exertion especially when climbing stairs. She also can have episodes at rest where HR can jump to 120s with no clear trigger. She has significantly cut back on caffeine and sweets and increased water intake, but symptoms have persisted. When her palpitations occur, they can last up to 38mnutes before improving. No chest pain, orthopnea, PND, or LE edema.   Prior pregnancy complicated by pre-eclampsia requiring induction at 38 weeks. Did not require antihypertensive postpatrum. No gestational HTN.    Family History: Father-DMII, HTN, HLD; Mother: HTN, DMII   Prior CV Studies Reviewed: The following studies were reviewed today:  Past Medical History:  Diagnosis Date   Complication of anesthesia    Elevated liver enzymes    Endometriosis    Medical history non-contributory    Otosclerosis     PONV (postoperative nausea and vomiting)    Tachycardia     Past Surgical History:  Procedure Laterality Date   CESAREAN SECTION N/A 06/14/2014   Procedure: CESAREAN SECTION;  Surgeon: MBobbye Charleston MD;  [facility];  Service: Obstetrics;  Laterality: N/A;   EXCISION MASS ABDOMINAL N/A 10/21/2019   Procedure: EXCISION MASS ABDOMINAL;  Surgeon: CClovis Riley MD;  Location: [facility];  Service: General;  Laterality: N/A;   stebactomy        OB History     Gravida  2   Para  1   Term  1   Preterm      AB      Living  0      SAB      IAB      Ectopic      Multiple  0   Live Births                  Current Medications: Current Meds  Medication Sig   aspirin EC 81 MG tablet Take 1 tablet (81 mg total) by mouth daily.   doxylamine, Sleep, (UNISOM) 25 MG tablet Take 25 mg by mouth 3 (three) times daily.   Prenatal Vit-Fe Fumarate-FA (PRENATAL PO) Take 1 tablet by mouth daily.   pyridOXINE (B-6) 50 MG tablet Take 50 mg by mouth 3 (three) times daily.     Allergies:   Other, Hydroxychloroquine, and Sulfamethoxazole-trimethoprim   Social History   Socioeconomic History  Marital status: Married    Spouse name: Not on file   Number of children: Not on file   Years of education: Not on file   Highest education level: Not on file  Occupational History   Not on file  Tobacco Use   Smoking status: Never   Smokeless tobacco: Never  Vaping Use   Vaping Use: Never used  Substance and Sexual Activity   Alcohol use: No   Drug use: No   Sexual activity: Not on file  Other Topics Concern   Not on file  Social History Narrative   Not on file   Social Determinants of Health   Financial Resource Strain: Not on file  Food Insecurity: Not on file  Transportation Needs: Not on file  Physical Activity: Not on file  Stress: Not on file  Social Connections: Not on file      Family History  Problem Relation Age of Onset   Other Mother         Elevated liver enzymes   Diabetes Mother    Hypertension Mother    Thyroid disease Mother    Diabetes Father    Hypertension Father    Hyperlipidemia Father    Other Brother        Elevated liver enzymes   Asthma Paternal Aunt    Asthma Paternal Grandmother    Healthy Son    Allergic rhinitis Neg Hx    Eczema Neg Hx    Urticaria Neg Hx       ROS:   Please see the history of present illness.    All other systems reviewed and are negative.   Labs/EKG Reviewed:    EKG:   EKG is ordered today.  The ekg ordered today demonstrates sinus tachycardia with HR 104  Recent Labs: 09/28/2020: ALT 28; BUN 10; Creatinine, Ser 0.79; Hemoglobin 13.5; Platelets 231; Potassium 4.0; Sodium 141; TSH 1.240   Recent Lipid Panel Lab Results  Component Value Date/Time   CHOL 176 09/28/2020 02:47 PM   TRIG 340 (H) 09/28/2020 02:47 PM   HDL 33 (L) 09/28/2020 02:47 PM   CHOLHDL 5.3 (H) 09/28/2020 02:47 PM   LDLCALC 87 09/28/2020 02:47 PM    Physical Exam:    VS:  BP 124/78   Pulse 92   Ht '5\' 4"'$  (1.626 m)   Wt 224 lb 12.8 oz (102 kg)   LMP  (LMP Unknown)   SpO2 99%   BMI 38.59 kg/m     Wt Readings from Last 3 Encounters:  08/23/21 224 lb 12.8 oz (102 kg)  06/15/21 219 lb (99.3 kg)  11/22/20 224 lb (101.6 kg)     GEN:  Well nourished, well developed in no acute distress HEENT: Normal NECK: No JVD; No carotid bruits CARDIAC: Tachycardic, regular, no murmurs, rubs, gallops RESPIRATORY:  Clear to auscultation without rales, wheezing or rhonchi  ABDOMEN: Soft, non-tender, non-distended MUSCULOSKELETAL:  No edema; No deformity  SKIN: Warm and dry NEUROLOGIC:  Alert and oriented x 3 PSYCHIATRIC:  Normal affect    Risk Assessment/Risk Calculators:    ASSESSMENT & PLAN:    #Palpitations: #SOB: Patient with several episodes of significant palpitations with SOB with activity as well as rest. While palpitations are common in pregnancy, degree of symptoms seem out-of-proportion to the  HD shifts of pregnancy. Will check zio monitor and TTE for further work-up. Patient would like to proceed with treatment pending zio findings.  -Check zio monitor -Check TTE -Increase hydration -Small frequent meals -  Has cut back on caffeine -Will plan for low dose metop pending zio monitor   Patient Instructions  Medication Instructions:   Your physician recommends that you continue on your current medications as directed. Please refer to the Current Medication list given to you today.  *If you need a refill on your cardiac medications before your next appointment, please call your pharmacy*   Testing/Procedures:  Your physician has requested that you have an OB echocardiogram. Echocardiography is a painless test that uses sound waves to create images of your heart. It provides your doctor with information about the size and shape of your heart and how well your heart's chambers and valves are working. This procedure takes approximately one hour. There are no restrictions for this procedure.  Cardiac-OB patient: to be performed by [facility] or [facility].    ZIO XT- Long Term Monitor Instructions  Your physician has requested you wear a ZIO patch monitor for 7  days.  This is a single patch monitor. [employer] supplies one patch monitor per enrollment. Additional stickers are not available. Please do not apply patch if you will be having a Nuclear Stress Test,  Echocardiogram, Cardiac CT, MRI, or Chest Xray during the period you would be wearing the  monitor. The patch cannot be worn during these tests. You cannot remove and re-apply the  ZIO XT patch monitor.  Your ZIO patch monitor will be mailed 3 day USPS to your address on file. It may take 3-5 days  to receive your monitor after you have been enrolled.  Once you have received your monitor, please review the enclosed instructions. Your monitor  has already been registered assigning a specific monitor serial # to you.  Billing and  Patient Assistance Program Information  We have supplied [employer] with any of your insurance information on file for billing purposes. [employer] offers a sliding scale Patient Assistance Program for patients that do not have  insurance, or whose insurance does not completely cover the cost of the ZIO monitor.  You must apply for the Patient Assistance Program to qualify for this discounted rate.  To apply, please call [employer] at [phone], select option 4, select option 2, ask to apply for  Patient Assistance Program. Theodore Demark will ask your household income, and how many people  are in your household. They will quote your out-of-pocket cost based on that information.  [employer] will also be able to set up a 18-month interest-free payment plan if needed.  Applying the monitor   Shave hair from upper left chest.  Hold abrader disc by orange tab. Rub abrader in 40 strokes over the upper left chest as  indicated in your monitor instructions.  Clean area with 4 enclosed alcohol pads. Let dry.  Apply patch as indicated in monitor instructions. Patch will be placed under collarbone on left  side of chest with arrow pointing upward.  Rub patch adhesive wings for 2 minutes. Remove white label marked "1". Remove the white  label marked "2". Rub patch adhesive wings for 2 additional minutes.  While looking in a mirror, press and release button in center of patch. A small green light will  flash 3-4 times. This will be your only indicator that the monitor has been turned on.  Do not shower for the first 24 hours. You may shower after the first 24 hours.  Press the button if you feel a symptom. You will hear a small click. Record Date, Time and  Symptom in the Patient Logbook.  When you are ready to remove the patch, follow instructions on the last 2 pages of Patient  Logbook. Stick patch monitor onto the last page of Patient Logbook.  Place Patient Logbook in the blue and white box. Use locking tab on  box and tape box closed  securely. The blue and white box has prepaid postage on it. Please place it in the mailbox as  soon as possible. Your physician should have your test results approximately 7 days after the  monitor has been mailed back to [facility].  Call [facility] at [phone] if you have questions regarding  your ZIO XT patch monitor. Call them immediately if you see an orange light blinking on your  monitor.  If your monitor falls off in less than 4 days, contact our Monitor department at [phone].  If your monitor becomes loose or falls off after 4 days call [employer] at [phone] for  suggestions on securing your monitor   Follow-Up:  AS NEEDED WITH DR. Johney Frame HERE AT [place] [facility]         Dispo:  No follow-ups on file.   Medication Adjustments/Labs and Tests Ordered: Current medicines are reviewed at length with the patient today.  Concerns regarding medicines are outlined above.  Tests Ordered: Orders Placed This Encounter  Procedures   LONG TERM MONITOR (3-14 DAYS)   EKG 12-Lead   ECHOCARDIOGRAM COMPLETE   Medication Changes: No orders of the defined types were placed in this encounter.

## 2021-08-23 ENCOUNTER — Ambulatory Visit (INDEPENDENT_AMBULATORY_CARE_PROVIDER_SITE_OTHER): Payer: No Typology Code available for payment source | Admitting: Cardiology

## 2021-08-23 ENCOUNTER — Encounter: Payer: Self-pay | Admitting: Cardiology

## 2021-08-23 ENCOUNTER — Ambulatory Visit (INDEPENDENT_AMBULATORY_CARE_PROVIDER_SITE_OTHER): Payer: No Typology Code available for payment source

## 2021-08-23 ENCOUNTER — Telehealth: Payer: Self-pay | Admitting: *Deleted

## 2021-08-23 VITALS — BP 124/78 | HR 92 | Ht 64.0 in | Wt 224.8 lb

## 2021-08-23 DIAGNOSIS — R000 Tachycardia, unspecified: Secondary | ICD-10-CM

## 2021-08-23 DIAGNOSIS — Z3A13 13 weeks gestation of pregnancy: Secondary | ICD-10-CM

## 2021-08-23 DIAGNOSIS — R0602 Shortness of breath: Secondary | ICD-10-CM

## 2021-08-23 DIAGNOSIS — R002 Palpitations: Secondary | ICD-10-CM | POA: Diagnosis not present

## 2021-08-23 NOTE — Progress Notes (Unsigned)
Enrolled patient for a 7 day Zio XT monitor to be mailed to patients home.  

## 2021-08-23 NOTE — Patient Instructions (Signed)
Medication Instructions:   Your physician recommends that you continue on your current medications as directed. Please refer to the Current Medication list given to you today.  *If you need a refill on your cardiac medications before your next appointment, please call your pharmacy*   Testing/Procedures:  Your physician has requested that you have an OB echocardiogram. Echocardiography is a painless test that uses sound waves to create images of your heart. It provides your doctor with information about the size and shape of your heart and how well your heart's chambers and valves are working. This procedure takes approximately one hour. There are no restrictions for this procedure.  Cardiac-OB patient: to be performed by [facility] or [place].    ZIO XT- Long Term Monitor Instructions  Your physician has requested you wear a ZIO patch monitor for 7  days.  This is a single patch monitor. [employer] supplies one patch monitor per enrollment. Additional stickers are not available. Please do not apply patch if you will be having a Nuclear Stress Test,  Echocardiogram, Cardiac CT, MRI, or Chest Xray during the period you would be wearing the  monitor. The patch cannot be worn during these tests. You cannot remove and re-apply the  ZIO XT patch monitor.  Your ZIO patch monitor will be mailed 3 day USPS to your address on file. It may take 3-5 days  to receive your monitor after you have been enrolled.  Once you have received your monitor, please review the enclosed instructions. Your monitor  has already been registered assigning a specific monitor serial # to you.  Billing and Patient Assistance Program Information  We have supplied [employer] with any of your insurance information on file for billing purposes. [employer] offers a sliding scale Patient Assistance Program for patients that do not have  insurance, or whose insurance does not completely cover the cost of the ZIO monitor.  You must apply  for the Patient Assistance Program to qualify for this discounted rate.  To apply, please call [employer] at [phone], select option 4, select option 2, ask to apply for  Patient Assistance Program. Theodore Demark will ask your household income, and how many people  are in your household. They will quote your out-of-pocket cost based on that information.  [employer] will also be able to set up a 51-month interest-free payment plan if needed.  Applying the monitor   Shave hair from upper left chest.  Hold abrader disc by orange tab. Rub abrader in 40 strokes over the upper left chest as  indicated in your monitor instructions.  Clean area with 4 enclosed alcohol pads. Let dry.  Apply patch as indicated in monitor instructions. Patch will be placed under collarbone on left  side of chest with arrow pointing upward.  Rub patch adhesive wings for 2 minutes. Remove white label marked "1". Remove the white  label marked "2". Rub patch adhesive wings for 2 additional minutes.  While looking in a mirror, press and release button in center of patch. A small green light will  flash 3-4 times. This will be your only indicator that the monitor has been turned on.  Do not shower for the first 24 hours. You may shower after the first 24 hours.  Press the button if you feel a symptom. You will hear a small click. Record Date, Time and  Symptom in the Patient Logbook.  When you are ready to remove the patch, follow instructions on the last 2 pages of Patient  Logbook.  Stick patch monitor onto the last page of Patient Logbook.  Place Patient Logbook in the blue and white box. Use locking tab on box and tape box closed  securely. The blue and white box has prepaid postage on it. Please place it in the mailbox as  soon as possible. Your physician should have your test results approximately 7 days after the  monitor has been mailed back to [facility].  Call [place] at [phone] if you  have questions regarding  your ZIO XT patch monitor. Call them immediately if you see an orange light blinking on your  monitor.  If your monitor falls off in less than 4 days, contact our Monitor department at [phone].  If your monitor becomes loose or falls off after 4 days call [employer] at [phone] for  suggestions on securing your monitor   Follow-Up:  AS NEEDED WITH DR. Johney Frame HERE AT [place]

## 2021-08-23 NOTE — Telephone Encounter (Signed)
-----   Message from Gerarda Gunther sent at 08/23/2021  5:24 PM EDT ----- Regarding: RE: 7 day zio per Dr. Johney Frame Done :-) ----- Message ----- From: Nuala Alpha, LPN Sent: 3/50/0938   5:18 PM EDT To: Jennefer Bravo; Gerarda Gunther Subject: 7 day zio per Dr. Johney Frame                    Dr. Johney Frame ordered a 7 day zio on this pt.  Order is in.  Please enroll and let me know when you do.   Thanks [employer]

## 2021-08-26 ENCOUNTER — Other Ambulatory Visit (HOSPITAL_COMMUNITY): Payer: Self-pay

## 2021-08-26 ENCOUNTER — Telehealth (INDEPENDENT_AMBULATORY_CARE_PROVIDER_SITE_OTHER): Payer: No Typology Code available for payment source | Admitting: *Deleted

## 2021-08-26 DIAGNOSIS — Z3A13 13 weeks gestation of pregnancy: Secondary | ICD-10-CM | POA: Diagnosis not present

## 2021-08-26 DIAGNOSIS — R002 Palpitations: Secondary | ICD-10-CM | POA: Diagnosis not present

## 2021-08-26 DIAGNOSIS — R000 Tachycardia, unspecified: Secondary | ICD-10-CM

## 2021-08-26 MED ORDER — METOPROLOL TARTRATE 25 MG PO TABS
12.5000 mg | ORAL_TABLET | Freq: Two times a day (BID) | ORAL | 1 refills | Status: DC
  Filled 2021-08-26: qty 90, 90d supply, fill #0
  Filled 2021-11-29: qty 90, 90d supply, fill #1

## 2021-08-26 NOTE — Telephone Encounter (Signed)
Pt had EKG done today for complaints of palpitations today after eating lunch. EKG done and interpreted by Dr. Johney Frame.  EKG showed sinus tach with a rate of 116 bpm.  Pt having symptoms.  Dr. Johney Frame discussed complaints and EKG with the pt.  Per Dr. Johney Frame, she endorsed to the pt that we will send in low dose metoprolol tartrate 12.5 mg po bid.  Pt is [redacted] weeks pregnant now, and this is safe for her to take per Dr. Johney Frame. Confirmed the pharmacy of choice with the pt.  Rx sent to pharmacy.  Pt aware to continue monitoring symptoms and stay well hydrated.  Pt verbalized understanding and agrees with this plan.

## 2021-08-27 NOTE — Addendum Note (Signed)
Addended by: Nuala Alpha on: 08/27/2021 11:41 AM   Modules accepted: Orders

## 2021-09-09 ENCOUNTER — Ambulatory Visit (HOSPITAL_COMMUNITY): Payer: No Typology Code available for payment source | Attending: Cardiology

## 2021-09-09 DIAGNOSIS — R002 Palpitations: Secondary | ICD-10-CM | POA: Insufficient documentation

## 2021-09-09 DIAGNOSIS — R0602 Shortness of breath: Secondary | ICD-10-CM | POA: Diagnosis not present

## 2021-09-09 DIAGNOSIS — O99211 Obesity complicating pregnancy, first trimester: Secondary | ICD-10-CM | POA: Diagnosis not present

## 2021-09-09 DIAGNOSIS — Z3A13 13 weeks gestation of pregnancy: Secondary | ICD-10-CM | POA: Diagnosis not present

## 2021-09-09 DIAGNOSIS — O26891 Other specified pregnancy related conditions, first trimester: Secondary | ICD-10-CM | POA: Diagnosis not present

## 2021-09-09 DIAGNOSIS — R000 Tachycardia, unspecified: Secondary | ICD-10-CM | POA: Insufficient documentation

## 2021-09-09 LAB — ECHOCARDIOGRAM COMPLETE
Area-P 1/2: 5.02 cm2
S' Lateral: 3.1 cm

## 2021-09-18 ENCOUNTER — Encounter (INDEPENDENT_AMBULATORY_CARE_PROVIDER_SITE_OTHER): Payer: Self-pay

## 2021-11-29 ENCOUNTER — Other Ambulatory Visit (HOSPITAL_COMMUNITY): Payer: Self-pay

## 2021-12-02 ENCOUNTER — Encounter (HOSPITAL_COMMUNITY): Payer: Self-pay

## 2021-12-02 ENCOUNTER — Inpatient Hospital Stay (HOSPITAL_COMMUNITY)
Admission: AD | Admit: 2021-12-02 | Discharge: 2021-12-02 | Disposition: A | Discharge status: Home / Self Care | Payer: No Typology Code available for payment source | Attending: Obstetrics and Gynecology | Admitting: Obstetrics and Gynecology

## 2021-12-02 DIAGNOSIS — R1030 Lower abdominal pain, unspecified: Secondary | ICD-10-CM | POA: Insufficient documentation

## 2021-12-02 DIAGNOSIS — Z0379 Encounter for other suspected maternal and fetal conditions ruled out: Secondary | ICD-10-CM | POA: Diagnosis not present

## 2021-12-02 DIAGNOSIS — Z3A30 30 weeks gestation of pregnancy: Secondary | ICD-10-CM

## 2021-12-02 DIAGNOSIS — Z0371 Encounter for suspected problem with amniotic cavity and membrane ruled out: Secondary | ICD-10-CM

## 2021-12-02 DIAGNOSIS — O26893 Other specified pregnancy related conditions, third trimester: Secondary | ICD-10-CM | POA: Diagnosis present

## 2021-12-02 HISTORY — DX: Supraventricular tachycardia, unspecified: I47.10

## 2021-12-02 LAB — WET PREP, GENITAL
Clue Cells Wet Prep HPF POC: NONE SEEN
Sperm: NONE SEEN
Trich, Wet Prep: NONE SEEN
WBC, Wet Prep HPF POC: >=10 — AB (ref <10)
Yeast Wet Prep HPF POC: NONE SEEN

## 2021-12-02 LAB — POCT FERN TEST: POCT Fern Test: NEGATIVE

## 2021-12-02 NOTE — Discharge Instructions (Signed)

## 2021-12-02 NOTE — MAU Provider Note (Signed)
History     CSN: [id number]  Arrival date and time: 12/02/21 1634   Event Date/Time   First Provider Initiated Contact with Patient 12/02/21 1713      Chief Complaint  Patient presents with   Abdominal Pain   Rupture of Membranes   HPI  Raven [name] is a 31 y.o. G2P1000 at 9w5dwho presents for evaluation of possible rupture of membranes. Patient reports she had intercourse yesterday and had a few gushes of clear fluid since then. She reports she has continued to have leaking today today. She reports she feels intermittent lower abdominal cramping but denies any pain at this time.   She denies any vaginal bleeding. Denies any constipation, diarrhea or any urinary complaints. Reports normal fetal movement.   OB History     Gravida  2   Para  1   Term  1   Preterm      AB      Living  0      SAB      IAB      Ectopic      Multiple  0   Live Births              Past Medical History:  Diagnosis Date   Complication of anesthesia    Elevated liver enzymes    Endometriosis    Medical history non-contributory    Otosclerosis    PONV (postoperative nausea and vomiting)    SVT (supraventricular tachycardia)    Tachycardia     Past Surgical History:  Procedure Laterality Date   CESAREAN SECTION N/A 06/14/2014   Procedure: CESAREAN SECTION;  Surgeon: MBobbye Charleston MD;  [facility];  Service: Obstetrics;  Laterality: N/A;   EXCISION MASS ABDOMINAL N/A 10/21/2019   Procedure: EXCISION MASS ABDOMINAL;  Surgeon: CClovis Riley MD;  Location: [facility];  Service: General;  Laterality: N/A;   stebactomy      Family History  Problem Relation Age of Onset   Other Mother        Elevated liver enzymes   Diabetes Mother    Hypertension Mother    Thyroid disease Mother    Diabetes Father    Hypertension Father    Hyperlipidemia Father    Other Brother        Elevated liver enzymes   Asthma Paternal Aunt    Asthma Paternal Grandmother     Healthy Son    Allergic rhinitis Neg Hx    Eczema Neg Hx    Urticaria Neg Hx     Social History   Tobacco Use   Smoking status: Never   Smokeless tobacco: Never  Vaping Use   Vaping Use: Never used  Substance Use Topics   Alcohol use: No   Drug use: No    Allergies:  Allergies  Allergen Reactions   Other     Walnuts - burns tongue    Hydroxychloroquine Rash and Hives   Sulfamethoxazole-Trimethoprim Rash    No medications prior to admission.    Review of Systems  Constitutional: Negative.  Negative for fatigue and fever.  HENT: Negative.    Respiratory: Negative.  Negative for shortness of breath.   Cardiovascular: Negative.  Negative for chest pain.  Gastrointestinal: Negative.  Negative for abdominal pain, constipation, diarrhea, nausea and vomiting.  Genitourinary:  Positive for vaginal discharge. Negative for dysuria and vaginal bleeding.  Neurological: Negative.  Negative for dizziness and headaches.   Physical Exam   Blood  pressure 119/78, pulse (!) 105, temperature 98.2 F (36.8 C), temperature source Oral, resp. rate 17, height '5\' 4"'$  (1.626 m), weight 102.8 kg, SpO2 100 %, unknown if currently breastfeeding.  Patient Vitals for the past 24 hrs:  BP Temp Temp src Pulse Resp SpO2 Height Weight  12/02/21 1748 119/78 -- -- (!) 105 -- -- -- --  12/02/21 1711 116/82 -- -- 96 -- -- -- --  12/02/21 1653 131/79 98.2 F (36.8 C) Oral (!) 101 17 100 % '5\' 4"'$  (1.626 m) 102.8 kg    Physical Exam Vitals and nursing note reviewed.  Constitutional:      General: She is not in acute distress.    Appearance: She is well-developed.  HENT:     Head: Normocephalic.  Eyes:     Pupils: Pupils are equal, round, and reactive to light.  Cardiovascular:     Rate and Rhythm: Normal rate and regular rhythm.     Heart sounds: Normal heart sounds.  Pulmonary:     Effort: Pulmonary effort is normal. No respiratory distress.     Breath sounds: Normal breath sounds.  Abdominal:      General: Bowel sounds are normal. There is no distension.     Palpations: Abdomen is soft.     Tenderness: There is no abdominal tenderness.  Genitourinary:    Comments: Pelvic exam: Cervix pink, visually closed, without lesion, scant white creamy discharge, vaginal walls and external genitalia normal  Skin:    General: Skin is warm and dry.  Neurological:     Mental Status: She is alert and oriented to person, place, and time.  Psychiatric:        Mood and Affect: Mood normal.        Behavior: Behavior normal.        Thought Content: Thought content normal.        Judgment: Judgment normal.     Fetal Tracing:  Baseline: 120 Variability: moderate Accels: 15x15 Decels: none  Toco: none  MAU Course  Procedures  Results for orders placed or performed during the hospital encounter of 12/02/21 (from the past 24 hour(s))  Wet prep, genital     Status: Abnormal   Collection Time: 12/02/21  5:20 PM  Result Value Ref Range   Yeast Wet Prep HPF POC NONE SEEN NONE SEEN   Trich, Wet Prep NONE SEEN NONE SEEN   Clue Cells Wet Prep HPF POC NONE SEEN NONE SEEN   WBC, Wet Prep HPF POC >=10 (A) <10   Sperm NONE SEEN   Fern Test     Status: None   Collection Time: 12/02/21  5:44 PM  Result Value Ref Range   POCT Fern Test Negative = intact amniotic membranes     MDM Prenatal records from community office reviewed. Pregnancy complicated by previous c/s  Labs ordered and reviewed.   Fern-negative Wet prep  Reassurance provided of normalcy of exam and normal increase in discharge in the third trimester  Assessment and Plan   1. Encounter for suspected premature rupture of amniotic membranes, with rupture of membranes not found   2. [redacted] weeks gestation of pregnancy     -Discharge home in stable condition -Third trimester precautions discussed -Patient advised to follow-up with OB as scheduled for prenatal care -Patient may return to MAU as needed or if her condition were to  change or worsen  Wende Mott, CNM 12/02/2021, 5:13 PM

## 2021-12-02 NOTE — MAU Note (Signed)
Raven [name] is a 31 y.o. at 88w5dhere in MAU reporting: yesterday had a couple times where had to put a liner in, soaked underwear at one point.  Soaked a liner again today.  Clear and watery. No bleeding. Sometimes feels a little bit of a cramp in lower abd.  +FM noted Had intercourse yesterday, prior to start of leaking  Onset of complaint: lunch yesterday Pain score: 3 Vitals:   12/02/21 1653  BP: 131/79  Pulse: (!) 101  Resp: 17  Temp: 98.2 F (36.8 C)  SpO2: 100%     FHT:132 Lab orders placed from triage:  fern

## 2021-12-10 ENCOUNTER — Other Ambulatory Visit (HOSPITAL_COMMUNITY): Payer: Self-pay

## 2021-12-10 ENCOUNTER — Encounter: Payer: Self-pay | Admitting: Cardiology

## 2021-12-10 MED ORDER — METOPROLOL TARTRATE 25 MG PO TABS
25.0000 mg | ORAL_TABLET | Freq: Two times a day (BID) | ORAL | 3 refills | Status: DC
  Filled 2021-12-10 – 2022-01-26 (×2): qty 180, 90d supply, fill #0

## 2021-12-10 NOTE — Telephone Encounter (Signed)
Dr. Johney Frame spoke to the pt about this message today.  Per Dr. Johney Frame, ok to increase her metoprolol tartrate to the full 25 mg po bid, but advised her to monitor her pressures, and hold dose if systolic is low.   Confirmed the pharmacy of choice with the pt.    Will send in lopressor 25 mg po bid to the pts pharmacy of choice.   Pt verbalized understanding and agrees with this plan.

## 2021-12-24 ENCOUNTER — Other Ambulatory Visit (HOSPITAL_COMMUNITY): Payer: Self-pay

## 2021-12-24 MED ORDER — SERTRALINE HCL 50 MG PO TABS
50.0000 mg | ORAL_TABLET | Freq: Every day | ORAL | 1 refills | Status: DC
  Filled 2021-12-24: qty 90, 90d supply, fill #0
  Filled 2022-03-24 (×2): qty 90, 90d supply, fill #1

## 2022-01-08 LAB — OB RESULTS CONSOLE GBS: GBS: NEGATIVE

## 2022-01-16 ENCOUNTER — Encounter (HOSPITAL_COMMUNITY): Payer: Self-pay

## 2022-01-16 NOTE — Patient Instructions (Signed)
MILANA SALAY  01/16/2022   Your procedure is scheduled on:  01/30/2022  Arrive at 0800 at Entrance C on [address] at [facility]  and [address]. You are invited to use the FREE valet parking or use the Visitor's parking deck.  Pick up the phone at the desk and dial [phone].  Call this number if you have problems the morning of surgery: [phone]  Remember:   Do not eat food:(After Midnight) Desps de medianoche.  Do not drink clear liquids: (After Midnight) Desps de medianoche.  Take these medicines the morning of surgery with A SIP OF WATER:  Take sertraline and lopressor as prescribed   Do not wear jewelry, make-up or nail polish.  Do not wear lotions, powders, or perfumes. Do not wear deodorant.  Do not shave 48 hours prior to surgery.  Do not bring valuables to the hospital.  [facility] is not   responsible for any belongings or valuables brought to the hospital.  Contacts, dentures or bridgework may not be worn into surgery.  Leave suitcase in the car. After surgery it may be brought to your room.  For patients admitted to the hospital, checkout time is 11:00 AM the day of              discharge.      Please read over the following fact sheets that you were given:     Preparing for Surgery

## 2022-01-22 NOTE — H&P (Signed)
Raven [name] is a 31 y.o. female G2P1001 at 68 weeks presenting for repeat C/S.  Antepartum complicated by obesity with BMI 40.  She has anxiety and h/o PPD; taking sertraline 50 mg daily.  Patient also has h/o SVT and is taking metoprolol 12.5 mg BID; managed by Cards.  Last growth u/s on 11/29 and EFW 7#2 (88%).  Patient has otosclerosis and wear bilateral hearing aids.  Patient has h/o GHTN; normal BPs this pregnancy and took low dose ASA daily.  Patient underwent abdominal mass excision in 2021 (LLQ) by Gen Surg with endometriosis on path.  GBS negative.    OB History     Gravida  2   Para  1   Term  1   Preterm      AB      Living  0      SAB      IAB      Ectopic      Multiple  0   Live Births             Past Medical History:  Diagnosis Date   Autoimmune disease ([place]) 67/89/3810   Complication of anesthesia    Elevated liver enzymes    Endometriosis    Medical history non-contributory    Otosclerosis    PONV (postoperative nausea and vomiting)    SVT (supraventricular tachycardia)    Tachycardia    Past Surgical History:  Procedure Laterality Date   CESAREAN SECTION N/A 06/14/2014   Procedure: CESAREAN SECTION;  Surgeon: Bobbye Charleston, MD;  Location: [facility];  Service: Obstetrics;  Laterality: N/A;   EXCISION MASS ABDOMINAL N/A 10/21/2019   Procedure: EXCISION MASS ABDOMINAL;  Surgeon: Clovis Riley, MD;  Location: [facility];  Service: General;  Laterality: N/A;   stebactomy     Family History: family history includes Asthma in her paternal aunt and paternal grandmother; Diabetes in her father and mother; Healthy in her son; Hyperlipidemia in her father; Hypertension in her father and mother; Other in her brother and mother; Thyroid disease in her mother. Social History:  reports that she has never smoked. She has never used smokeless tobacco. She reports that she does not drink alcohol and does not use drugs.     Maternal Diabetes: No Genetic  Screening: Normal Maternal Ultrasounds/Referrals: Normal Fetal Ultrasounds or other Referrals:  None Maternal Substance Abuse:  No Significant Maternal Medications:  Meds include: Zoloft Other: metoprolol Significant Maternal Lab Results:  Group B Strep negative Number of Prenatal Visits:greater than 3 verified prenatal visits Other Comments:  None  Review of Systems Maternal Medical History:  Prenatal complications: no prenatal complications Prenatal Complications - Diabetes: none.     unknown if currently breastfeeding. Maternal Exam:  Abdomen: Patient reports no abdominal tenderness. Surgical scars: low transverse.   Fundal height is c/w dates.   Estimated fetal weight is 8#.     Physical Exam Constitutional:      Appearance: Normal appearance.  HENT:     Head: Normocephalic and atraumatic.  Pulmonary:     Effort: Pulmonary effort is normal.  Abdominal:     Palpations: Abdomen is soft.  Musculoskeletal:        General: Normal range of motion.     Cervical back: Normal range of motion.  Skin:    General: Skin is warm and dry.  Neurological:     Mental Status: She is alert and oriented to person, place, and time.  Psychiatric:  Mood and Affect: Mood normal.        Behavior: Behavior normal.     Prenatal labs: ABO, Rh: A/Positive/-- (05/19 0000) Antibody: Negative (05/19 0000) Rubella: Immune (05/19 0000) RPR: Nonreactive (05/19 0000)  HBsAg: Negative (05/19 0000)  HIV: Non-reactive (05/19 0000)  GBS: Negative/-- (11/29 0000)   Assessment/Plan: 31yo G2P1001 at 39 weeks for repeat C/S Patient is counseled re: risk of bleeding, infection, scarring and damage to surrounding tissues.  She is informed of implications in future pregnancies such as uterine rupture and abnormal placentation. All questions were answered and patient wishes to proceed.   [name] 01/22/2022, 10:17 AM

## 2022-01-27 ENCOUNTER — Other Ambulatory Visit: Payer: Self-pay

## 2022-01-28 ENCOUNTER — Encounter (HOSPITAL_COMMUNITY)
Admission: RE | Admit: 2022-01-28 | Discharge: 2022-01-28 | Disposition: A | Discharge status: Home / Self Care | Payer: No Typology Code available for payment source | Source: Ambulatory Visit | Attending: Obstetrics & Gynecology | Admitting: Obstetrics & Gynecology

## 2022-01-28 DIAGNOSIS — O34219 Maternal care for unspecified type scar from previous cesarean delivery: Secondary | ICD-10-CM | POA: Insufficient documentation

## 2022-01-28 DIAGNOSIS — Z01812 Encounter for preprocedural laboratory examination: Secondary | ICD-10-CM | POA: Insufficient documentation

## 2022-01-28 DIAGNOSIS — Z3A00 Weeks of gestation of pregnancy not specified: Secondary | ICD-10-CM | POA: Insufficient documentation

## 2022-01-28 LAB — CBC
HCT: 40.1 % (ref 36.0–46.0)
Hemoglobin: 13.1 g/dL (ref 12.0–15.0)
MCH: 30.5 pg (ref 26.0–34.0)
MCHC: 32.7 g/dL (ref 30.0–36.0)
MCV: 93.3 fL (ref 80.0–100.0)
Platelets: 163 10*3/uL (ref 150–400)
RBC: 4.3 MIL/uL (ref 3.87–5.11)
RDW: 14 % (ref 11.5–15.5)
WBC: 7.3 10*3/uL (ref 4.0–10.5)
nRBC: 0 % (ref 0.0–0.2)

## 2022-01-28 LAB — RPR: RPR Ser Ql: NONREACTIVE

## 2022-01-28 LAB — TYPE AND SCREEN
ABO/RH(D): A POS
Antibody Screen: NEGATIVE

## 2022-01-29 NOTE — Anesthesia Preprocedure Evaluation (Addendum)
Anesthesia Evaluation  Patient identified by MRN, date of birth, ID band Patient awake    Reviewed: Allergy & Precautions, H&P , NPO status , Patient's Chart, lab work & pertinent test results  History of Anesthesia Complications (+) PONV and history of anesthetic complications  Airway Mallampati: I  TM Distance: >3 FB Neck ROM: Full    Dental no notable dental hx. (+) Teeth Intact, Dental Advisory Given   Pulmonary neg pulmonary ROS   Pulmonary exam normal breath sounds clear to auscultation       Cardiovascular Exercise Tolerance: Good Normal cardiovascular exam+ dysrhythmias Supra Ventricular Tachycardia  Rhythm:Regular Rate:Normal  ECHO 7/23 looks great with normal pumping function, normal RV function and no significant valve disease.   Neuro/Psych negative neurological ROS  negative psych ROS   GI/Hepatic negative GI ROS, Neg liver ROS,,,  Endo/Other  negative endocrine ROS    Renal/GU negative Renal ROS  negative genitourinary   Musculoskeletal negative musculoskeletal ROS (+)    Abdominal  (+) + obese  Peds negative pediatric ROS (+)  Hematology negative hematology ROS (+)   Anesthesia Other Findings   Reproductive/Obstetrics negative OB ROS                             Anesthesia Physical Anesthesia Plan  ASA: 3  Anesthesia Plan: Spinal   Post-op Pain Management:    Induction: Intravenous  PONV Risk Score and Plan: 3 and Scopolamine patch - Pre-op, Ondansetron and Treatment may vary due to age or medical condition  Airway Management Planned: Natural Airway  Additional Equipment: None  Intra-op Plan:   Post-operative Plan:   Informed Consent: I have reviewed the patients History and Physical, chart, labs and discussed the procedure including the risks, benefits and alternatives for the proposed anesthesia with the patient or authorized representative who has  indicated his/her understanding and acceptance.     Dental advisory given  Plan Discussed with: Anesthesiologist and CRNA  Anesthesia Plan Comments:        Anesthesia Quick Evaluation

## 2022-01-30 ENCOUNTER — Encounter (HOSPITAL_COMMUNITY)
Admission: RE | Disposition: A | Discharge status: Home / Self Care | Payer: Self-pay | Source: Ambulatory Visit | Attending: Obstetrics & Gynecology

## 2022-01-30 ENCOUNTER — Inpatient Hospital Stay (HOSPITAL_COMMUNITY): Payer: No Typology Code available for payment source | Admitting: Anesthesiology

## 2022-01-30 ENCOUNTER — Encounter (HOSPITAL_COMMUNITY): Payer: Self-pay | Admitting: Obstetrics & Gynecology

## 2022-01-30 ENCOUNTER — Inpatient Hospital Stay (HOSPITAL_COMMUNITY)
Admission: RE | Admit: 2022-01-30 | Discharge: 2022-02-01 | DRG: 783 | Disposition: A | Discharge status: Home / Self Care | Payer: No Typology Code available for payment source | Source: Ambulatory Visit | Attending: Obstetrics & Gynecology | Admitting: Obstetrics & Gynecology

## 2022-01-30 ENCOUNTER — Other Ambulatory Visit: Payer: Self-pay

## 2022-01-30 DIAGNOSIS — Z302 Encounter for sterilization: Secondary | ICD-10-CM | POA: Diagnosis not present

## 2022-01-30 DIAGNOSIS — H8093 Unspecified otosclerosis, bilateral: Secondary | ICD-10-CM | POA: Diagnosis present

## 2022-01-30 DIAGNOSIS — E669 Obesity, unspecified: Secondary | ICD-10-CM

## 2022-01-30 DIAGNOSIS — I471 Supraventricular tachycardia, unspecified: Secondary | ICD-10-CM | POA: Diagnosis present

## 2022-01-30 DIAGNOSIS — O34211 Maternal care for low transverse scar from previous cesarean delivery: Principal | ICD-10-CM | POA: Diagnosis present

## 2022-01-30 DIAGNOSIS — O34219 Maternal care for unspecified type scar from previous cesarean delivery: Secondary | ICD-10-CM

## 2022-01-30 DIAGNOSIS — O99214 Obesity complicating childbirth: Secondary | ICD-10-CM

## 2022-01-30 DIAGNOSIS — Z98891 History of uterine scar from previous surgery: Secondary | ICD-10-CM

## 2022-01-30 DIAGNOSIS — Z3A39 39 weeks gestation of pregnancy: Secondary | ICD-10-CM

## 2022-01-30 DIAGNOSIS — O99892 Other specified diseases and conditions complicating childbirth: Secondary | ICD-10-CM | POA: Diagnosis present

## 2022-01-30 DIAGNOSIS — O9942 Diseases of the circulatory system complicating childbirth: Secondary | ICD-10-CM | POA: Diagnosis present

## 2022-01-30 SURGERY — Surgical Case
Anesthesia: Spinal | Laterality: Bilateral

## 2022-01-30 MED ORDER — KETOROLAC TROMETHAMINE 30 MG/ML IJ SOLN: 30.0000 mg | Freq: Four times a day (QID) | INTRAMUSCULAR | Status: AC | PRN

## 2022-01-30 MED ORDER — STERILE WATER FOR IRRIGATION IR SOLN
Status: DC | PRN
  Administered 2022-01-30: 1000 mL

## 2022-01-30 MED ORDER — SIMETHICONE 80 MG PO CHEW: 80.0000 mg | CHEWABLE_TABLET | ORAL | Status: DC | PRN

## 2022-01-30 MED ORDER — IBUPROFEN 600 MG PO TABS
600.0000 mg | ORAL_TABLET | Freq: Four times a day (QID) | ORAL | Status: DC
  Administered 2022-01-30 – 2022-02-01 (×7): 600 mg via ORAL
  Filled 2022-01-30 (×7): qty 1

## 2022-01-30 MED ORDER — METHYLERGONOVINE MALEATE 0.2 MG/ML IJ SOLN
INTRAMUSCULAR | Status: AC
  Filled 2022-01-30: qty 1

## 2022-01-30 MED ORDER — WITCH HAZEL-GLYCERIN EX PADS: 1.0000 | MEDICATED_PAD | CUTANEOUS | Status: DC | PRN

## 2022-01-30 MED ORDER — SCOPOLAMINE 1 MG/3DAYS TD PT72
1.0000 | MEDICATED_PATCH | Freq: Once | TRANSDERMAL | Status: DC
  Administered 2022-01-30: 1.5 mg via TRANSDERMAL
  Filled 2022-01-30: qty 1

## 2022-01-30 MED ORDER — METOPROLOL TARTRATE 12.5 MG HALF TABLET
12.5000 mg | ORAL_TABLET | Freq: Every day | ORAL | Status: DC
  Administered 2022-01-31: 12.5 mg via ORAL
  Filled 2022-01-30 (×2): qty 1

## 2022-01-30 MED ORDER — MEPERIDINE HCL 25 MG/ML IJ SOLN: 6.2500 mg | INTRAMUSCULAR | Status: DC | PRN

## 2022-01-30 MED ORDER — KETOROLAC TROMETHAMINE 30 MG/ML IJ SOLN
30.0000 mg | Freq: Four times a day (QID) | INTRAMUSCULAR | Status: AC | PRN
  Administered 2022-01-30 (×2): 30 mg via INTRAVENOUS
  Filled 2022-01-30: qty 1

## 2022-01-30 MED ORDER — FENTANYL CITRATE (PF) 100 MCG/2ML IJ SOLN
INTRAMUSCULAR | Status: AC
  Filled 2022-01-30: qty 2

## 2022-01-30 MED ORDER — TRANEXAMIC ACID-NACL 1000-0.7 MG/100ML-% IV SOLN
INTRAVENOUS | Status: AC
  Filled 2022-01-30: qty 100

## 2022-01-30 MED ORDER — DIBUCAINE (PERIANAL) 1 % EX OINT: 1.0000 | TOPICAL_OINTMENT | CUTANEOUS | Status: DC | PRN

## 2022-01-30 MED ORDER — TRANEXAMIC ACID-NACL 1000-0.7 MG/100ML-% IV SOLN
INTRAVENOUS | Status: DC | PRN
  Administered 2022-01-30: 1000 mg via INTRAVENOUS

## 2022-01-30 MED ORDER — DIPHENHYDRAMINE HCL 50 MG/ML IJ SOLN: 12.5000 mg | INTRAMUSCULAR | Status: DC | PRN

## 2022-01-30 MED ORDER — KETOROLAC TROMETHAMINE 30 MG/ML IJ SOLN
INTRAMUSCULAR | Status: AC
  Filled 2022-01-30: qty 1

## 2022-01-30 MED ORDER — PHENYLEPHRINE HCL-NACL 20-0.9 MG/250ML-% IV SOLN
INTRAVENOUS | Status: AC
  Filled 2022-01-30: qty 250

## 2022-01-30 MED ORDER — ONDANSETRON HCL 4 MG/2ML IJ SOLN
INTRAMUSCULAR | Status: DC | PRN
  Administered 2022-01-30: 4 mg via INTRAVENOUS

## 2022-01-30 MED ORDER — CEFAZOLIN SODIUM-DEXTROSE 2-4 GM/100ML-% IV SOLN
INTRAVENOUS | Status: AC
  Filled 2022-01-30: qty 100

## 2022-01-30 MED ORDER — PHENYLEPHRINE HCL-NACL 20-0.9 MG/250ML-% IV SOLN
INTRAVENOUS | Status: DC | PRN
  Administered 2022-01-30: 60 ug/min via INTRAVENOUS

## 2022-01-30 MED ORDER — MORPHINE SULFATE (PF) 0.5 MG/ML IJ SOLN
INTRAMUSCULAR | Status: AC
  Filled 2022-01-30: qty 10

## 2022-01-30 MED ORDER — OXYTOCIN-SODIUM CHLORIDE 30-0.9 UT/500ML-% IV SOLN
INTRAVENOUS | Status: AC
  Filled 2022-01-30: qty 500

## 2022-01-30 MED ORDER — MORPHINE SULFATE (PF) 0.5 MG/ML IJ SOLN
INTRAMUSCULAR | Status: DC | PRN
  Administered 2022-01-30: 150 ug via INTRATHECAL

## 2022-01-30 MED ORDER — SIMETHICONE 80 MG PO CHEW
80.0000 mg | CHEWABLE_TABLET | Freq: Three times a day (TID) | ORAL | Status: DC
  Administered 2022-01-30 – 2022-02-01 (×5): 80 mg via ORAL
  Filled 2022-01-30 (×6): qty 1

## 2022-01-30 MED ORDER — NALOXONE HCL 4 MG/10ML IJ SOLN: 1.0000 ug/kg/h | INTRAVENOUS | Status: DC | PRN

## 2022-01-30 MED ORDER — OXYTOCIN-SODIUM CHLORIDE 30-0.9 UT/500ML-% IV SOLN
2.5000 [IU]/h | INTRAVENOUS | Status: AC
  Administered 2022-01-30: 2.5 [IU]/h via INTRAVENOUS
  Filled 2022-01-30: qty 500

## 2022-01-30 MED ORDER — COCONUT OIL OIL: 1.0000 | TOPICAL_OIL | Status: DC | PRN

## 2022-01-30 MED ORDER — SODIUM CHLORIDE 0.9 % IR SOLN
Status: DC | PRN
  Administered 2022-01-30: 1

## 2022-01-30 MED ORDER — METOPROLOL TARTRATE 25 MG PO TABS
25.0000 mg | ORAL_TABLET | Freq: Every day | ORAL | Status: DC
  Administered 2022-02-01: 25 mg via ORAL
  Filled 2022-01-30 (×2): qty 1
  Filled 2022-01-30: qty 2
  Filled 2022-01-30: qty 1

## 2022-01-30 MED ORDER — NALOXONE HCL 0.4 MG/ML IJ SOLN: 0.4000 mg | INTRAMUSCULAR | Status: DC | PRN

## 2022-01-30 MED ORDER — PRENATAL MULTIVITAMIN CH
1.0000 | ORAL_TABLET | Freq: Every day | ORAL | Status: DC
  Administered 2022-01-31 – 2022-02-01 (×2): 1 via ORAL
  Filled 2022-01-30 (×2): qty 1

## 2022-01-30 MED ORDER — DEXAMETHASONE SODIUM PHOSPHATE 10 MG/ML IJ SOLN
INTRAMUSCULAR | Status: DC | PRN
  Administered 2022-01-30: 4 mg via INTRAVENOUS

## 2022-01-30 MED ORDER — BUPIVACAINE IN DEXTROSE 0.75-8.25 % IT SOLN
INTRATHECAL | Status: DC | PRN
  Administered 2022-01-30: 1.55 mL via INTRATHECAL

## 2022-01-30 MED ORDER — LACTATED RINGERS IV SOLN: INTRAVENOUS | Status: DC

## 2022-01-30 MED ORDER — ONDANSETRON HCL 4 MG/2ML IJ SOLN: 4.0000 mg | Freq: Three times a day (TID) | INTRAMUSCULAR | Status: DC | PRN

## 2022-01-30 MED ORDER — MENTHOL 3 MG MT LOZG
1.0000 | LOZENGE | OROMUCOSAL | Status: DC | PRN
  Administered 2022-02-01: 3 mg via ORAL
  Filled 2022-01-30: qty 9

## 2022-01-30 MED ORDER — FENTANYL CITRATE (PF) 100 MCG/2ML IJ SOLN
INTRAMUSCULAR | Status: DC | PRN
  Administered 2022-01-30: 15 ug via INTRATHECAL

## 2022-01-30 MED ORDER — POVIDONE-IODINE 10 % EX SWAB: 2.0000 | Freq: Once | CUTANEOUS | Status: DC

## 2022-01-30 MED ORDER — OXYCODONE HCL 5 MG PO TABS: 5.0000 mg | ORAL_TABLET | ORAL | Status: DC | PRN

## 2022-01-30 MED ORDER — ONDANSETRON HCL 4 MG/2ML IJ SOLN
INTRAMUSCULAR | Status: AC
  Filled 2022-01-30: qty 2

## 2022-01-30 MED ORDER — DEXAMETHASONE SODIUM PHOSPHATE 4 MG/ML IJ SOLN
INTRAMUSCULAR | Status: AC
  Filled 2022-01-30: qty 1

## 2022-01-30 MED ORDER — ZOLPIDEM TARTRATE 5 MG PO TABS: 5.0000 mg | ORAL_TABLET | Freq: Every evening | ORAL | Status: DC | PRN

## 2022-01-30 MED ORDER — CEFAZOLIN SODIUM-DEXTROSE 2-4 GM/100ML-% IV SOLN
2.0000 g | INTRAVENOUS | Status: AC
  Administered 2022-01-30: 2 g via INTRAVENOUS

## 2022-01-30 MED ORDER — METHYLERGONOVINE MALEATE 0.2 MG/ML IJ SOLN
INTRAMUSCULAR | Status: DC | PRN
  Administered 2022-01-30: .2 mg via INTRAMUSCULAR

## 2022-01-30 MED ORDER — ACETAMINOPHEN 500 MG PO TABS
1000.0000 mg | ORAL_TABLET | Freq: Four times a day (QID) | ORAL | Status: DC
  Administered 2022-01-30 – 2022-02-01 (×8): 1000 mg via ORAL
  Filled 2022-01-30 (×9): qty 2

## 2022-01-30 MED ORDER — SERTRALINE HCL 50 MG PO TABS
50.0000 mg | ORAL_TABLET | Freq: Every day | ORAL | Status: DC
  Administered 2022-01-30 – 2022-01-31 (×2): 50 mg via ORAL
  Filled 2022-01-30 (×3): qty 1

## 2022-01-30 MED ORDER — SENNOSIDES-DOCUSATE SODIUM 8.6-50 MG PO TABS
2.0000 | ORAL_TABLET | Freq: Every day | ORAL | Status: DC
  Administered 2022-01-31 – 2022-02-01 (×2): 2 via ORAL
  Filled 2022-01-30 (×2): qty 2

## 2022-01-30 MED ORDER — DIPHENHYDRAMINE HCL 25 MG PO CAPS: 25.0000 mg | ORAL_CAPSULE | ORAL | Status: DC | PRN

## 2022-01-30 MED ORDER — SODIUM CHLORIDE 0.9% FLUSH: 3.0000 mL | INTRAVENOUS | Status: DC | PRN

## 2022-01-30 MED ORDER — OXYTOCIN-SODIUM CHLORIDE 30-0.9 UT/500ML-% IV SOLN
INTRAVENOUS | Status: DC | PRN
  Administered 2022-01-30: 300 mL via INTRAVENOUS

## 2022-01-30 MED ORDER — DIPHENHYDRAMINE HCL 25 MG PO CAPS: 25.0000 mg | ORAL_CAPSULE | Freq: Four times a day (QID) | ORAL | Status: DC | PRN

## 2022-01-30 SURGICAL SUPPLY — 34 items
ADH SKN CLS APL DERMABOND .7 (GAUZE/BANDAGES/DRESSINGS)
APL PRP STRL LF DISP 70% ISPRP (MISCELLANEOUS) ×2
APL SKNCLS STERI-STRIP NONHPOA (GAUZE/BANDAGES/DRESSINGS) ×1
BENZOIN TINCTURE PRP APPL 2/3 (GAUZE/BANDAGES/DRESSINGS) ×1 IMPLANT
CHLORAPREP W/TINT 26 (MISCELLANEOUS) ×2 IMPLANT
CLAMP UMBILICAL CORD (MISCELLANEOUS) ×1 IMPLANT
CLOTH BEACON ORANGE TIMEOUT ST (SAFETY) ×1 IMPLANT
DERMABOND ADVANCED .7 DNX12 (GAUZE/BANDAGES/DRESSINGS) IMPLANT
DRSG OPSITE POSTOP 4X10 (GAUZE/BANDAGES/DRESSINGS) ×1 IMPLANT
ELECT REM PT RETURN 9FT ADLT (ELECTROSURGICAL) ×1
ELECTRODE REM PT RTRN 9FT ADLT (ELECTROSURGICAL) ×1 IMPLANT
EXTRACTOR VACUUM KIWI (MISCELLANEOUS) IMPLANT
GLOVE BIO SURGEON STRL SZ 6 (GLOVE) ×1 IMPLANT
GLOVE BIOGEL PI IND STRL 6 (GLOVE) ×2 IMPLANT
GLOVE BIOGEL PI IND STRL 7.0 (GLOVE) ×1 IMPLANT
GOWN STRL REUS W/TWL LRG LVL3 (GOWN DISPOSABLE) ×2 IMPLANT
KIT ABG SYR 3ML LUER SLIP (SYRINGE) ×1 IMPLANT
MAT PREVALON FULL STRYKER (MISCELLANEOUS) IMPLANT
NDL HYPO 25X5/8 SAFETYGLIDE (NEEDLE) ×1 IMPLANT
NEEDLE HYPO 25X5/8 SAFETYGLIDE (NEEDLE) ×1 IMPLANT
NS IRRIG 1000ML POUR BTL (IV SOLUTION) ×1 IMPLANT
PACK C SECTION WH (CUSTOM PROCEDURE TRAY) ×1 IMPLANT
PAD OB MATERNITY 4.3X12.25 (PERSONAL CARE ITEMS) ×1 IMPLANT
STRIP CLOSURE SKIN 1/2X4 (GAUZE/BANDAGES/DRESSINGS) IMPLANT
SUT CHROMIC 0 CTX 36 (SUTURE) ×3 IMPLANT
SUT MON AB 2-0 CT1 27 (SUTURE) ×1 IMPLANT
SUT PDS AB 0 CT1 27 (SUTURE) IMPLANT
SUT PDS AB 1 CT1 36 (SUTURE) IMPLANT
SUT PLAIN 0 NONE (SUTURE) IMPLANT
SUT VIC AB 0 CT1 36 (SUTURE) IMPLANT
SUT VIC AB 4-0 KS 27 (SUTURE) IMPLANT
TOWEL OR 17X24 6PK STRL BLUE (TOWEL DISPOSABLE) ×1 IMPLANT
TRAY FOLEY W/BAG SLVR 14FR LF (SET/KITS/TRAYS/PACK) IMPLANT
WATER STERILE IRR 1000ML POUR (IV SOLUTION) ×1 IMPLANT

## 2022-01-30 NOTE — Progress Notes (Signed)
No change to H&P.  Courtne Lighty, DO 

## 2022-01-30 NOTE — Anesthesia Procedure Notes (Signed)
Spinal  Patient location during procedure: OR Start time: 01/30/2022 9:30 AM End time: 01/30/2022 9:36 AM Reason for block: surgical anesthesia Staffing Anesthesiologist: Janeece Riggers, MD Performed by: Janeece Riggers, MD Authorized by: Janeece Riggers, MD   Preanesthetic Checklist Completed: patient identified, IV checked, site marked, risks and benefits discussed, surgical consent, monitors and equipment checked, pre-op evaluation and timeout performed Spinal Block Patient position: sitting Prep: DuraPrep Patient monitoring: heart rate, cardiac monitor, continuous pulse ox and blood pressure Approach: midline Location: L3-4 Injection technique: single-shot Needle Needle type: Sprotte  Needle gauge: 24 G Needle length: 9 cm Assessment Sensory level: T4 Events: CSF return

## 2022-01-30 NOTE — Anesthesia Postprocedure Evaluation (Signed)
Anesthesia Post Note  Patient: Raven [name]  Procedure(s) Performed: REPEAT CESAREAN SECTION WITH BILATERAL TUBAL LIGATION EDC: 02-05-22 ALLERG: HYDROXYCHLOROQUINE, SULFA, WALNUT  PREVIOUS X 1 (Bilateral)     Anesthesia Type: Spinal Anesthetic complications: no   No notable events documented.  Last Vitals:  Vitals:   01/30/22 1214 01/30/22 1302  BP: 117/86 118/87  Pulse: 76 75  Resp: 18 18  Temp: 36.7 C 36.8 C  SpO2: 100% 100%    Last Pain:  Vitals:   01/30/22 1302  TempSrc: Oral  PainSc:    Pain Goal: Patients Stated Pain Goal: 7 (01/30/22 0843)                 Mukhtar Shams

## 2022-01-30 NOTE — Transfer of Care (Signed)
Immediate Anesthesia Transfer of Care Note  Patient: TAMECKA MILHAM  Procedure(s) Performed: REPEAT CESAREAN SECTION WITH BILATERAL TUBAL LIGATION EDC: 02-05-22 ALLERG: HYDROXYCHLOROQUINE, SULFA, WALNUT  PREVIOUS X 1 (Bilateral)  Patient Location: PACU  Anesthesia Type:Spinal  Level of Consciousness: awake  Airway & Oxygen Therapy: Patient Spontanous Breathing  Post-op Assessment: Report given to RN and Post -op Vital signs reviewed and stable  Post vital signs: Reviewed and stable  Last Vitals:  Vitals Value Taken Time  BP 118/95 01/30/22 1101  Temp    Pulse 80 01/30/22 1102  Resp 21 01/30/22 1102  SpO2 100 % 01/30/22 1102  Vitals shown include unvalidated device data.  Last Pain:  Vitals:   01/30/22 0843  TempSrc:   PainSc: 0-No pain      Patients Stated Pain Goal: 7 (67/42/55 2589)  Complications: No notable events documented.

## 2022-01-30 NOTE — Op Note (Signed)
Raven [name] PROCEDURE DATE: 01/30/2022  PREOPERATIVE DIAGNOSIS: Intrauterine pregnancy at  18w1dweeks gestation, previous cesarean delivery, desire for sterility  POSTOPERATIVE DIAGNOSIS: The same  PROCEDURE: Repeat Low Transverse Cesarean Section with Bilateral Tubal Ligation  SURGEON:  Dr. MLinda [name] INDICATIONS: Raven BRAUNis a 31y.o. G2P1001 at 32w1dcheduled for cesarean section and tubal ligation secondary to desire for repeat and sterility.  The risks of cesarean section discussed with the patient included but were not limited to: bleeding which may require transfusion or reoperation; infection which may require antibiotics; injury to bowel, bladder, ureters or other surrounding organs; injury to the fetus; need for additional procedures including hysterectomy in the event of a life-threatening hemorrhage; placental abnormalities wth subsequent pregnancies, incisional problems, thromboembolic phenomenon and other postoperative/anesthesia complications. Patient is counseled re: risk of failure, regret and ectopic with BTL.  The patient concurred with the proposed plan, giving informed written consent for the procedure.    FINDINGS:  Viable female infant in cephalic presentation, APGARs 8,9: weight pending  Clear amniotic fluid.  Intact placenta, three vessel cord.  Grossly normal uterus, ovaries and fallopian tubes. .   ANESTHESIA: Spinal ESTIMATED BLOOD LOSS: 746 ml SPECIMENS: Placenta sent to L&D, bilateral tubal segments sent to pathology COMPLICATIONS: None immediate  PROCEDURE IN DETAIL:  The patient received intravenous antibiotics and had sequential compression devices applied to her lower extremities while in the preoperative area.  She was then taken to the operating room where spinal anesthesia was administered and was found to be adequate. She was then placed in a dorsal supine position with a leftward tilt, and prepped and draped in a sterile manner.  A  foley catheter was placed into her bladder and attached to constant gravity.  After an adequate timeout was performed, a Pfannenstiel skin incision was made with scalpel and carried through to the underlying layer of fascia. The fascia was incised in the midline and this incision was extended bilaterally using the Mayo scissors. Kocher clamps were applied to the superior aspect of the fascial incision and the underlying rectus muscles were dissected off bluntly. A similar process was carried out on the inferior aspect of the facial incision. The rectus muscles were separated in the midline bluntly and the peritoneum was entered bluntly.   A transverse hysterotomy was made with a scalpel and extended bilaterally bluntly. The bladder blade was then removed. The infant was successfully delivered, and cord was clamped and cut and infant was handed over to awaiting neonatology team. Tranexamic acid was given per patient request secondary to history of postop anemia.  Uterine massage was then administered and the placenta delivered intact with three-vessel cord. The uterus was cleared of clot and debris.  Uterine atony was noted and methergine x 1 dose was given.  The hysterotomy was closed with 0 chromic.  A second imbricating suture of 0-chromic was used to reinforce the incision and aid in hemostasis.  Attention was turned to the fallopian tubes.  The right fallopian tube was elevated, doubly tied with plain gut suture.  Tubal knuckle was excised and hemostasis was observed.  The contralateral tube was treated in the same fashion.  Irrigation was performed and bilateral tubal sites continued to be hemostatic.  The peritoneum and rectus muscles were noted to be hemostatic and were reapproximated with 2-0 monocryl in a running fashion.  The fascia was closed with 0-PDS in a running fashion with good restoration of anatomy.  The subcutaneus tissue was copiously  irrigated.  The skin was closed with 4-0 vicryl in a  subcuticular fashion.  Pt tolerated the procedure will.  All counts were correct x2.  Pt went to the recovery room in stable condition.

## 2022-01-30 NOTE — Lactation Note (Signed)
This note was copied from a baby's chart. Lactation Consultation Note  Patient Name: Raven [name] [id number] Date: 01/30/2022 Reason for consult: Initial assessment;Term;1st time breastfeeding Age:32 hours Per Birth Parent infant had one void and 3 stools since birth. Birth Parent was given hand pump by RN. LC saw it on counter. Per Birth Parent infant recently BF at 2200 pm for 5 minutes, infant was asleep on Birth Parent's chest when [facility] entered the room. Birth Parent is interested in apply for [employer]. Birth Parent will continue to BF infant by cues, on demand, 8 to 12+ times within 24 hours, STS. Birth Parent knows to call [place] for further latch assistance if needed. LC discussed infant's I&O with Birth Parent. LC discussed the importance of maternal rest, diet and hydration. Mom made aware of O/P services, breastfeeding support groups, community resources, and our phone # for post-discharge questions.   Maternal Data Has patient been taught Hand Expression?: Yes Does the patient have breastfeeding experience prior to this delivery?: No  Feeding Mother's Current Feeding Choice: Breast Milk  LATCH Score                    Lactation Tools Discussed/Used    Interventions Interventions: Breast feeding basics reviewed;Skin to skin;Hand express;LC Services brochure;Breast compression;Education  Discharge Pump:  (LC sent [place] Form referral for DEBP)  Consult Status Consult Status: Follow-up Date: 01/31/22 Follow-up type: In-patient    Eulis Canner 01/30/2022, 11:34 PM

## 2022-01-31 LAB — CBC
HCT: 29.3 % — ABNORMAL LOW (ref 36.0–46.0)
Hemoglobin: 10 g/dL — ABNORMAL LOW (ref 12.0–15.0)
MCH: 31.8 pg (ref 26.0–34.0)
MCHC: 34.1 g/dL (ref 30.0–36.0)
MCV: 93.3 fL (ref 80.0–100.0)
Platelets: 114 10*3/uL — ABNORMAL LOW (ref 150–400)
RBC: 3.14 MIL/uL — ABNORMAL LOW (ref 3.87–5.11)
RDW: 13.8 % (ref 11.5–15.5)
WBC: 9.4 10*3/uL (ref 4.0–10.5)
nRBC: 0 % (ref 0.0–0.2)

## 2022-01-31 LAB — BIRTH TISSUE RECOVERY COLLECTION (PLACENTA DONATION)

## 2022-01-31 NOTE — Progress Notes (Signed)
MOB was referred for history of depression/anxiety. * Referral screened out by Clinical Social Worker because none of the following criteria appear to apply: ~ History of anxiety/depression during this pregnancy, or of post-partum depression following prior delivery. ~ Diagnosis of anxiety and/or depression within last 3 years OR * MOB's symptoms currently being treated with medication and/or therapy. Per chart review, MOB is prescribed/taking Zoloft.   Please contact the Clinical Social Worker if needs arise, by [facility] request, or if MOB scores greater than 9/yes to question 10 on Edinburgh Postpartum Depression Screen.  Abundio Miu, [place] Worker [facility] Cell#: [phone]

## 2022-01-31 NOTE — Lactation Note (Signed)
This note was copied from a baby's chart. Lactation Consultation Note  Patient Name: Raven [name] [id number] Date: 01/31/2022 Reason for consult: Follow-up assessment;1st time breastfeeding;Term;Infant weight loss;Difficult latch;Breastfeeding assistance (baby having a difficult time staying latched without assist) Age:32 hours LC started a NS #24  and baby was able to sustain the latch without problems after LC assisted. LC showed mom how to roll the nipple shield to apply it. If EBM or formula can instill it into the top for appetizer.  LC recommended to use the football position due to the baby's recessed chin.  Due to the 7% weight loss - and baby being over 24 hours LC recommended supplementing pace feeding EBM and or Formula.  MBURN Kristen Lassiter aware to show parents pace feeding.  [facility] set up the DEBP and the #24 F is the right fit for today, per mom comfortable.  LC plan:  Offer the breast 1st with feeding cues and by 3 hours.  Breast 1st with the #24 NS - 15 -20 mins ( 30 mins max )  And supplement afterwards with guidelines.  Post pump both breast 15 - 20 mins , save milk for the next feeding.  Next feeding switch to the other breast.   Maternal Data Has patient been taught Hand Expression?: Yes  Feeding Mother's Current Feeding Choice: Breast Milk  LATCH Score Latch: Grasps breast easily, tongue down, lips flanged, rhythmical sucking.  Audible Swallowing: A few with stimulation (increased to 2)  Type of Nipple: Everted at rest and after stimulation  Comfort (Breast/Nipple): Soft / non-tender  Hold (Positioning): Assistance needed to correctly position infant at breast and maintain latch.  LATCH Score: 8   Lactation Tools Discussed/Used Tools: Pump;Flanges;Nipple Shields Nipple shield size: 24 Flange Size: 24 Breast pump type: Double-Electric Breast Pump;Manual Pump Education: Setup, frequency, and cleaning Reason for Pumping: due to DL , Nipple shield  and now baby 7 % weight loss at 23 hours old  Interventions Interventions: Breast feeding basics reviewed;Assisted with latch;Skin to skin;Breast massage;Hand express;Reverse pressure;Breast compression;Adjust position;Support pillows;Position options;Hand pump;DEBP;Education  Discharge Pump: Personal;DEBP WIC Program: No  Consult Status Consult Status: Follow-up Date: 02/01/22 Follow-up type: In-patient    [place] 01/31/2022, 1:21 PM

## 2022-01-31 NOTE — Progress Notes (Signed)
Subjective: Postpartum Day 1: Cesarean Delivery Patient reports tolerating PO, + flatus, and no problems voiding.    Objective: Vital signs in last 24 hours: Temp:  [97.5 F (36.4 C)-98.6 F (37 C)] 98.3 F (36.8 C) (12/22 0300) Pulse Rate:  [75-156] 86 (12/22 0300) Resp:  [16-23] 18 (12/22 0300) BP: (100-136)/(70-95) 112/82 (12/22 0300) SpO2:  [98 %-100 %] 98 % (12/21 1700)  Physical Exam:  General: alert, cooperative, appears stated age, and no distress Lochia: appropriate Uterine Fundus: firm Incision: healing well DVT Evaluation: No evidence of DVT seen on physical exam.  Recent Labs    01/28/22 0939 01/31/22 0454  HGB 13.1 10.0*  HCT 40.1 29.3*    Assessment/Plan: Status post Cesarean section. Doing well postoperatively.  Continue current care.  Luz Lex, MD 01/31/2022, 9:15 AM

## 2022-02-01 MED ORDER — IBUPROFEN 600 MG PO TABS: 600.0000 mg | ORAL_TABLET | Freq: Four times a day (QID) | ORAL | 0 refills | Status: DC | PRN

## 2022-02-01 NOTE — Discharge Summary (Signed)
Postpartum Discharge Summary      Patient Name: Raven [name] DOB: Aug 24, 1990 MRN: [id number]  Date of admission: 01/30/2022 Delivery date:01/30/2022  Delivering provider: [name]  Date of discharge: 02/01/2022  Admitting diagnosis: Previous cesarean section [Z98.891] S/P cesarean section [Z98.891] Intrauterine pregnancy: [redacted]w[redacted]d    Secondary diagnosis:  Principal Problem:   Previous cesarean section Active Problems:   S/P cesarean section  Additional problems:      Discharge diagnosis: Term Pregnancy Delivered                                              Post partum procedures:   Augmentation: N/A Complications: None  Hospital course: Sceduled C/S   31y.o. yo G2P2001 at 32w1das admitted to the hospital 01/30/2022 for scheduled cesarean section with the following indication:Elective Repeat.Delivery details are as follows:  Membrane Rupture Time/Date: 9:51 AM ,01/30/2022   Delivery Method:C-Section, Low Transverse  Details of operation can be found in separate operative note.  Patient had a postpartum course complicated by .  She is ambulating, tolerating a regular diet, passing flatus, and urinating well. Patient is discharged home in stable condition on  02/01/22        Newborn Data: Birth date:01/30/2022  Birth time:9:52 AM  Gender:Female  Living status:Living  Apgars:8 ,9  Weight:3830 g     Magnesium Sulfate received: No BMZ received: No Rhophylac:N/A MMR:N/A T-DaP:Given prenatally Flu: N/A Transfusion:No  Physical exam  Vitals:   01/31/22 1356 01/31/22 1512 01/31/22 2150 02/01/22 0528  BP: 122/80 104/76 114/87 126/88  Pulse: 98 (!) 116 (!) 118 (!) 131  Resp: _0 Temp: 98.4 F (36.9 C) 98.1 F (36.7 C) 98 F (36.7 C) 98.5 F (36.9 C)  TempSrc: Oral Oral Oral Oral  SpO2: 99%     Weight:      Height:       General: alert, cooperative, and no distress Lochia: appropriate Uterine Fundus: firm Incision: Healing well with no  significant drainage DVT Evaluation: No evidence of DVT seen on physical exam. Labs: Lab Results  Component Value Date   WBC 9.4 01/31/2022   HGB 10.0 (L) 01/31/2022   HCT 29.3 (L) 01/31/2022   MCV 93.3 01/31/2022   PLT 114 (L) 01/31/2022      Latest Ref Rng & Units 09/28/2020    2:47 PM  CMP  Glucose 65 - 99 mg/dL 120   BUN 6 - 20 mg/dL 10   Creatinine 0.57 - 1.00 mg/dL 0.79   Sodium 134 - 144 mmol/L 141   Potassium 3.5 - 5.2 mmol/L 4.0   Chloride 96 - 106 mmol/L 102   CO2 20 - 29 mmol/L 24   Calcium 8.7 - 10.2 mg/dL 9.5   Total Protein 6.0 - 8.5 g/dL 6.7   Total Bilirubin 0.0 - 1.2 mg/dL 0.2   Alkaline Phos 44 - 121 IU/L 73   AST 0 - 40 IU/L 24   ALT 0 - 32 IU/L 28    Edinburgh Score:    01/31/2022    3:26 PM  Edinburgh Postnatal Depression Scale Screening Tool  I have been able to laugh and see the funny side of things. 0  I have looked forward with enjoyment to things. 0  I have blamed myself unnecessarily when things went wrong. 0  I have been  anxious or worried for no good reason. 0  I have felt scared or panicky for no good reason. 0  Things have been getting on top of me. 1  I have been so unhappy that I have had difficulty sleeping. 0  I have felt sad or miserable. 0  I have been so unhappy that I have been crying. 1  The thought of harming myself has occurred to me. 0  Edinburgh Postnatal Depression Scale Total 2      After visit meds:  Allergies as of 02/01/2022       Reactions   Other    Walnuts - burns tongue    Hydroxychloroquine Rash, Hives   Sulfamethoxazole-trimethoprim Rash        Medication List     STOP taking these medications    aspirin EC 81 MG tablet       TAKE these medications    ibuprofen 600 MG tablet Commonly known as: ADVIL Take 1 tablet (600 mg total) by mouth every 6 (six) hours as needed.   metoprolol tartrate 25 MG tablet Commonly known as: LOPRESSOR Take 1 tablet (25 mg total) by mouth 2 (two) times  daily. What changed:  how much to take when to take this additional instructions   PRENATAL PO Take 1 tablet by mouth daily.   promethazine 25 MG tablet Commonly known as: PHENERGAN Take 1 tablet (25 mg total) by mouth every 6 (six) hours as needed for nausea or vomiting.   sertraline 50 MG tablet Commonly known as: ZOLOFT Take 1 tablet (50 mg total) by mouth daily. What changed: when to take this         Discharge home in stable condition Infant Feeding: Breast Infant Disposition:home with mother Discharge instruction: per After Visit Summary and Postpartum booklet. Activity: Advance as tolerated. Pelvic rest for 6 weeks.  Diet: routine diet Anticipated Birth Control: BTL done PP Postpartum Appointment:6 weeks Additional Postpartum F/U:    Future Appointments:No future appointments. Follow up Visit:      02/01/2022 Luz Lex, MD

## 2022-02-01 NOTE — Lactation Note (Addendum)
This note was copied from a baby's chart. Lactation Consultation Note  Patient Name: Girl Coretha Creswell [id number] Date: 02/01/2022 Reason for consult: Follow-up assessment;Infant weight loss Age:32 hours  P2, 9.6% weight loss.  Mother states baby does not wake for feeds and she has to wake her up.  Mother also states the plan with breastfeeding, pumping and supplementing was overwhelming with her first child. She states for now she would like to only formula feed.  She is aware to increase supplementation to 30 ml- 45 ml.  More if desired.  Observed baby take bottle with ease.  Discussed paced feeding and pumping with DEBP once home q 3 hours as an option of providing breastmilk to her baby.  Feed on demand with cues.  Goal 8-12+ times per day after first 24 hrs.   Reviewed engorgement care and monitoring voids/stools.  Maternal Data Does the patient have breastfeeding experience prior to this delivery?: Yes  Feeding Mother's Current Feeding Choice: Formula  Lactation Tools Discussed/Used  DEBP  Interventions Interventions: Education;DEBP  Discharge Discharge Education: Engorgement and breast care;Warning signs for feeding baby;Outpatient recommendation Pump: Personal;DEBP  Consult Status Consult Status: Complete Date: 02/01/22    Vivianne Master [facility] 02/01/2022, 10:59 AM

## 2022-02-04 LAB — SURGICAL PATHOLOGY

## 2022-02-07 ENCOUNTER — Other Ambulatory Visit: Payer: Self-pay | Admitting: Nurse Practitioner

## 2022-02-07 ENCOUNTER — Telehealth: Payer: No Typology Code available for payment source | Admitting: Nurse Practitioner

## 2022-02-07 DIAGNOSIS — L509 Urticaria, unspecified: Secondary | ICD-10-CM | POA: Diagnosis not present

## 2022-02-07 MED ORDER — PREDNISONE 10 MG (21) PO TBPK: ORAL_TABLET | ORAL | 0 refills | Status: DC

## 2022-02-07 NOTE — Progress Notes (Signed)
Meds sent  Meds ordered this encounter  Medications   predniSONE (STERAPRED UNI-PAK 21 TAB) 10 MG (21) TBPK tablet    Sig: As directed x 6 days    Dispense:  21 tablet    Refill:  0    Order Specific Question:   Supervising Provider    AnswerChase Picket [[id number]]   [place], FNP

## 2022-02-07 NOTE — Progress Notes (Signed)
E Visit for Rash  We are sorry that you are not feeling well. Here is how we plan to help!    I recommend you take Benadryl 25 mg - 50 mg every 4 hours to control the symptoms but if they last over 24 hours it is best that you see an office based provider for follow up.    Prednisone 10 mg daily for 6 days (see taper instructions below)  Directions for 6 day taper: Day 1: 2 tablets before breakfast, 1 after both lunch & dinner and 2 at bedtime Day 2: 1 tab before breakfast, 1 after both lunch & dinner and 2 at bedtime Day 3: 1 tab at each meal & 1 at bedtime Day 4: 1 tab at breakfast, 1 at lunch, 1 at bedtime Day 5: 1 tab at breakfast & 1 tab at bedtime Day 6: 1 tab at breakfast  STOP Macrobid. Was taking for UTI- Just had baby but is not breast feeding. SHe spoke to [facility] and she had 3 days of macrobid and they do not want to give her anything more fofr UTI at this time.   HOME CARE:  Take cool showers and avoid direct sunlight. Apply cool compress or wet dressings. Take a bath in an oatmeal bath.  Sprinkle content of one Aveeno packet under running faucet with comfortably warm water.  Bathe for 15-20 minutes, 1-2 times daily.  Pat dry with a towel. Do not rub the rash. Use hydrocortisone cream. Take an antihistamine like Benadryl for widespread rashes that itch.  The adult dose of Benadryl is 25-50 mg by mouth 4 times daily. Caution:  This type of medication may cause sleepiness.  Do not drink alcohol, drive, or operate dangerous machinery while taking antihistamines.  Do not take these medications if you have prostate enlargement.  Read package instructions thoroughly on all medications that you take.  GET HELP RIGHT AWAY IF:  Symptoms don't go away after treatment. Severe itching that persists. If you rash spreads or swells. If you rash begins to smell. If it blisters and opens or develops a yellow-brown crust. You develop a fever. You have a sore throat. You become short of  breath.  MAKE SURE YOU:  Understand these instructions. Will watch your condition. Will get help right away if you are not doing well or get worse.  Thank you for choosing an e-visit.  Your e-visit answers were reviewed by a board certified advanced clinical practitioner to complete your personal care plan. Depending upon the condition, your plan could have included both over the counter or prescription medications.  Please review your pharmacy choice. Make sure the pharmacy is open so you can pick up prescription now. If there is a problem, you may contact your provider through [employer] and have the prescription routed to another pharmacy.  Your safety is important to [place]. If you have drug allergies check your prescription carefully.   For the next 24 hours you can use MyChart to ask questions about today's visit, request a non-urgent call back, or ask for a work or school excuse. You will get an email in the next two days asking about your experience. I hope that your e-visit has been valuable and will speed your recovery.   Mary-Margaret Hassell Done, FNP   5-10 minutes spent reviewing and documenting in chart.

## 2022-02-08 ENCOUNTER — Telehealth (HOSPITAL_COMMUNITY): Payer: Self-pay

## 2022-02-08 ENCOUNTER — Inpatient Hospital Stay (HOSPITAL_COMMUNITY): Payer: Commercial Managed Care - PPO

## 2022-02-08 ENCOUNTER — Encounter (HOSPITAL_COMMUNITY): Payer: Self-pay | Admitting: Obstetrics and Gynecology

## 2022-02-08 ENCOUNTER — Inpatient Hospital Stay (EMERGENCY_DEPARTMENT_HOSPITAL)
Admission: AD | Admit: 2022-02-08 | Discharge: 2022-02-08 | Disposition: A | Discharge status: Home / Self Care | Payer: Commercial Managed Care - PPO | Source: Home / Self Care | Attending: Obstetrics and Gynecology | Admitting: Obstetrics and Gynecology

## 2022-02-08 DIAGNOSIS — Z3A00 Weeks of gestation of pregnancy not specified: Secondary | ICD-10-CM

## 2022-02-08 DIAGNOSIS — O9089 Other complications of the puerperium, not elsewhere classified: Secondary | ICD-10-CM | POA: Insufficient documentation

## 2022-02-08 DIAGNOSIS — R109 Unspecified abdominal pain: Secondary | ICD-10-CM | POA: Insufficient documentation

## 2022-02-08 DIAGNOSIS — R519 Headache, unspecified: Secondary | ICD-10-CM | POA: Diagnosis not present

## 2022-02-08 DIAGNOSIS — Z98891 History of uterine scar from previous surgery: Secondary | ICD-10-CM | POA: Insufficient documentation

## 2022-02-08 DIAGNOSIS — O1495 Unspecified pre-eclampsia, complicating the puerperium: Secondary | ICD-10-CM | POA: Diagnosis not present

## 2022-02-08 DIAGNOSIS — O909 Complication of the puerperium, unspecified: Secondary | ICD-10-CM | POA: Diagnosis not present

## 2022-02-08 DIAGNOSIS — R030 Elevated blood-pressure reading, without diagnosis of hypertension: Secondary | ICD-10-CM | POA: Diagnosis not present

## 2022-02-08 LAB — CBC
HCT: 34.1 % — ABNORMAL LOW (ref 36.0–46.0)
Hemoglobin: 10.7 g/dL — ABNORMAL LOW (ref 12.0–15.0)
MCH: 30 pg (ref 26.0–34.0)
MCHC: 31.4 g/dL (ref 30.0–36.0)
MCV: 95.5 fL (ref 80.0–100.0)
Platelets: 289 10*3/uL (ref 150–400)
RBC: 3.57 MIL/uL — ABNORMAL LOW (ref 3.87–5.11)
RDW: 13.2 % (ref 11.5–15.5)
WBC: 4.6 10*3/uL (ref 4.0–10.5)
nRBC: 0 % (ref 0.0–0.2)

## 2022-02-08 LAB — COMPREHENSIVE METABOLIC PANEL
ALT: 22 U/L (ref 0–44)
AST: 24 U/L (ref 15–41)
Albumin: 2.9 g/dL — ABNORMAL LOW (ref 3.5–5.0)
Alkaline Phosphatase: 77 U/L (ref 38–126)
Anion gap: 9 (ref 5–15)
BUN: 14 mg/dL (ref 6–20)
CO2: 21 mmol/L — ABNORMAL LOW (ref 22–32)
Calcium: 8.6 mg/dL — ABNORMAL LOW (ref 8.9–10.3)
Chloride: 110 mmol/L (ref 98–111)
Creatinine, Ser: 0.93 mg/dL (ref 0.44–1.00)
GFR, Estimated: >60 mL/min (ref >60)
Glucose, Bld: 105 mg/dL — ABNORMAL HIGH (ref 70–99)
Potassium: 4.1 mmol/L (ref 3.5–5.1)
Sodium: 140 mmol/L (ref 135–145)
Total Bilirubin: 0.6 mg/dL (ref 0.3–1.2)
Total Protein: 6.1 g/dL — ABNORMAL LOW (ref 6.5–8.1)

## 2022-02-08 MED ORDER — IOHEXOL 350 MG/ML SOLN
75.0000 mL | Freq: Once | INTRAVENOUS | Status: AC | PRN
  Administered 2022-02-08: 75 mL via INTRAVENOUS

## 2022-02-08 NOTE — MAU Provider Note (Cosign Needed)
Chief Complaint: Drainage from Incision   Event Date/Time   First Provider Initiated Contact with Patient 02/08/22 0408      SUBJECTIVE HPI: Raven [name] is a 31 y.o. [id number] who is Postop Day # 9 following repeat LTCS who presents to maternity admissions reporting sudden leaking of pink fluid from cesarean incision, enough to soak underwear and pad  prior to arrival in MAU. The drainage/leaking is associated with pain/tenderness at the mid/right portion of the cesarean incision. She had UTI symptoms and started Macrobid 3 days ago, then developed hives and was told to stop Macrobid yesterday. She denies any other symptoms.     HPI  Past Medical History:  Diagnosis Date   Autoimmune disease ([place]) 45/40/9811   Complication of anesthesia    Elevated liver enzymes    Endometriosis    Medical history non-contributory    Otosclerosis    PONV (postoperative nausea and vomiting)    SVT (supraventricular tachycardia)    Tachycardia    Past Surgical History:  Procedure Laterality Date   CESAREAN SECTION N/A 06/14/2014   Procedure: CESAREAN SECTION;  Surgeon: Bobbye Charleston, MD;  Location: [facility];  Service: Obstetrics;  Laterality: N/A;   CESAREAN SECTION WITH BILATERAL TUBAL LIGATION Bilateral 01/30/2022   Procedure: REPEAT CESAREAN SECTION WITH BILATERAL TUBAL LIGATION EDC: 02-05-22 ALLERG: HYDROXYCHLOROQUINE, SULFA, WALNUT  PREVIOUS X 1;  Surgeon: [name], DO;  Location: [facility];  Service: Obstetrics;  Laterality: Bilateral;   EXCISION MASS ABDOMINAL N/A 10/21/2019   Procedure: EXCISION MASS ABDOMINAL;  Surgeon: Clovis Riley, MD;  Location: [facility];  Service: General;  Laterality: N/A;   stebactomy     Social History   Socioeconomic History   Marital status: Married    Spouse name: Not on file   Number of children: Not on file   Years of education: Not on file   Highest education level: Not on file  Occupational History   Not on file  Tobacco Use   Smoking  status: Never   Smokeless tobacco: Never  Vaping Use   Vaping Use: Never used  Substance and Sexual Activity   Alcohol use: No   Drug use: No   Sexual activity: Yes  Other Topics Concern   Not on file  Social History Narrative   Not on file   Social Determinants of Health   Financial Resource Strain: Not on file  Food Insecurity: Not on file  Transportation Needs: Not on file  Physical Activity: Not on file  Stress: Not on file  Social Connections: Not on file  Intimate Partner Violence: Not on file   No current facility-administered medications on file prior to encounter.   Current Outpatient Medications on File Prior to Encounter  Medication Sig Dispense Refill   ibuprofen (ADVIL) 600 MG tablet Take 1 tablet (600 mg total) by mouth every 6 (six) hours as needed. 30 tablet 0   predniSONE (STERAPRED UNI-PAK 21 TAB) 10 MG (21) TBPK tablet As directed x 6 days 21 tablet 0   metoprolol tartrate (LOPRESSOR) 25 MG tablet Take 1 tablet (25 mg total) by mouth 2 (two) times daily. (Patient taking differently: Take 12.5-25 mg by mouth See admin instructions. Take 25 mg in the morning and 12.5 mg at night) 180 tablet 3   Prenatal Vit-Fe Fumarate-FA (PRENATAL PO) Take 1 tablet by mouth daily.     promethazine (PHENERGAN) 25 MG tablet Take 1 tablet (25 mg total) by mouth every 6 (six) hours as needed  for nausea or vomiting. (Patient not taking: Reported on 08/23/2021) 30 tablet 0   sertraline (ZOLOFT) 50 MG tablet Take 1 tablet (50 mg total) by mouth daily. (Patient taking differently: Take 50 mg by mouth at bedtime.) 90 tablet 1   Allergies  Allergen Reactions   Macrobid [Nitrofurantoin] Hives   Other     Walnuts - burns tongue    Hydroxychloroquine Rash and Hives   Sulfamethoxazole-Trimethoprim Rash    ROS:  Review of Systems  Constitutional:  Negative for chills, fatigue and fever.  Eyes:  Negative for visual disturbance.  Respiratory:  Negative for shortness of breath.    Cardiovascular:  Negative for chest pain.  Gastrointestinal:  Positive for abdominal pain. Negative for nausea and vomiting.  Genitourinary:  Negative for difficulty urinating, dysuria, flank pain, pelvic pain, vaginal bleeding, vaginal discharge and vaginal pain.  Skin:  Positive for wound.       Pink watery drainage from cesarean incision  Neurological:  Negative for dizziness and headaches.  Psychiatric/Behavioral: Negative.       I have reviewed patient's Past Medical Hx, Surgical Hx, Family Hx, Social Hx, medications and allergies.   Physical Exam  Patient Vitals for the past 24 hrs:  BP Temp Pulse Resp Height Weight  02/08/22 0315 137/86 98 F (36.7 C) 61 16 '5\' 4"'$  (1.626 m) 101.3 kg   Constitutional: Well-developed, well-nourished female in no acute distress.  Cardiovascular: normal rate Respiratory: normal effort GI: Abd soft, non-tender. Pos BS x 4 MS: Extremities nontender, no edema, normal ROM Neurologic: Alert and oriented x 4.  GU: Neg CVAT. Incision: well approximated, no edema or erythema. Serosanguinous fluid draining from tiny opening on right margin of incision, too small for tip of cotton swab.  PELVIC EXAM: Deferred   LAB RESULTS Results for orders placed or performed during the hospital encounter of 02/08/22 (from the past 24 hour(s))  CBC     Status: Abnormal   Collection Time: 02/08/22  4:34 AM  Result Value Ref Range   WBC 4.6 4.0 - 10.5 K/uL   RBC 3.57 (L) 3.87 - 5.11 MIL/uL   Hemoglobin 10.7 (L) 12.0 - 15.0 g/dL   HCT 34.1 (L) 36.0 - 46.0 %   MCV 95.5 80.0 - 100.0 fL   MCH 30.0 26.0 - 34.0 pg   MCHC 31.4 30.0 - 36.0 g/dL   RDW 13.2 11.5 - 15.5 %   Platelets 289 150 - 400 K/uL   nRBC 0.0 0.0 - 0.2 %  Comprehensive metabolic panel     Status: Abnormal   Collection Time: 02/08/22  4:34 AM  Result Value Ref Range   Sodium 140 135 - 145 mmol/L   Potassium 4.1 3.5 - 5.1 mmol/L   Chloride 110 98 - 111 mmol/L   CO2 21 (L) 22 - 32 mmol/L   Glucose,  Bld 105 (H) 70 - 99 mg/dL   BUN 14 6 - 20 mg/dL   Creatinine, Ser 0.93 0.44 - 1.00 mg/dL   Calcium 8.6 (L) 8.9 - 10.3 mg/dL   Total Protein 6.1 (L) 6.5 - 8.1 g/dL   Albumin 2.9 (L) 3.5 - 5.0 g/dL   AST 24 15 - 41 U/L   ALT 22 0 - 44 U/L   Alkaline Phosphatase 77 38 - 126 U/L   Total Bilirubin 0.6 0.3 - 1.2 mg/dL   GFR, Estimated >60 >60 mL/min   Anion gap 9 5 - 15    --/--/A POS (12/19 8413)  IMAGING CT  ABDOMEN PELVIS W CONTRAST  Result Date: 02/08/2022 CLINICAL DATA:  31 year old female with history of abdominal pain and postoperative drainage following cesarean section. Evaluate for infection/abscess. EXAM: CT ABDOMEN AND PELVIS WITH CONTRAST TECHNIQUE: Multidetector CT imaging of the abdomen and pelvis was performed using the standard protocol following bolus administration of intravenous contrast. RADIATION DOSE REDUCTION: This exam was performed according to the departmental dose-optimization program which includes automated exposure control, adjustment of the mA and/or kV according to patient size and/or use of iterative reconstruction technique. CONTRAST:  20m OMNIPAQUE IOHEXOL 350 MG/ML SOLN COMPARISON:  CT of the abdomen and pelvis 08/21/2019. FINDINGS: Lower chest: Unremarkable. Hepatobiliary: No definite suspicious cystic or solid hepatic lesions. No intra or extrahepatic biliary ductal dilatation. Gallbladder is unremarkable in appearance. Pancreas: No pancreatic mass. No pancreatic ductal dilatation. No pancreatic or peripancreatic fluid collections or inflammatory changes. Spleen: Unremarkable. Adrenals/Urinary Tract: Bilateral kidneys and adrenal glands are normal in appearance. No hydroureteronephrosis. Urinary bladder is unremarkable in appearance. Stomach/Bowel: The appearance of the stomach is normal. There is no pathologic dilatation of small bowel or colon. Normal appendix. Vascular/Lymphatic: No significant atherosclerotic disease, aneurysm or dissection noted in the  abdominal or pelvic vasculature. No lymphadenopathy noted in the abdomen or pelvis. Reproductive: Uterus appears mildly enlarged. Healing incision in the inferior aspect of the anterior wall of the body of the uterus related to recent cesarean section. Ovaries are unremarkable in appearance. Other: Trace amount of free fluid in the cul-de-sac. No larger volume of ascites. No pneumoperitoneum. Musculoskeletal: There is a small amount of low-intermediate attenuation fluid associated with the infraumbilical anterior abdominal wall musculature involving the rectus sheath bilaterally (left-greater-than-right), estimated to measure approximately 10.4 x 2.1 x 9.8 cm (axial image 75 of series 3 and sagittal image 105 of series 7). Mild soft tissue prominence along the left lateral margin of this region best appreciated on axial image 77 of series 3, slightly less pronounced than prior study from 2021, likely some chronic scar tissue. There are no aggressive appearing lytic or blastic lesions noted in the visualized portions of the skeleton. IMPRESSION: 1. Postoperative changes of recent cesarean section (performed 01/30/2022) with small amount of postoperative fluid in the inferior aspect of the anterior abdominal wall musculature, as above. This is low-attenuation, and could represent resolving seroma/hematoma, however, the possibility of an intramuscular abscess is not entirely excluded. No findings to suggest intraperitoneal abscess. 2. No other acute findings are noted elsewhere in the abdomen or pelvis. Electronically Signed   By: DVinnie LangtonM.D.   On: 02/08/2022 06:34    MAU Management/MDM: Orders Placed This Encounter  Procedures   CT ABDOMEN PELVIS W CONTRAST   CBC   Comprehensive metabolic panel   Discharge patient    Meds ordered this encounter  Medications   iohexol (OMNIPAQUE) 350 MG/ML injection 75 mL    Large amount of serosanguinous fluid, no odor, minimal pain to palpation. Small firm  area below right margin of incision, near tiny opening with drainage.  CT ordered to evaluate and scar tissue and possible resolving seroma/hematoma. Cannot rule out abscess but no other s/sx of infection with normal WBCs, no fever/chills, and minimal pain.  Consult Dr ARoselie Awkwardwith assessment and findings.  Keep incision clean and dry.  Steristrips removed today in MAU.  F/U in office in 1-2 weeks or return to MAU sooner with worsening symptoms.  Urine sent for culture given recent UTI then stopping abx after hives.   ASSESSMENT 1. Cesarean section wound complication  PLAN Discharge home Allergies as of 02/08/2022       Reactions   Macrobid [nitrofurantoin] Hives   Other    Walnuts - burns tongue    Hydroxychloroquine Rash, Hives   Sulfamethoxazole-trimethoprim Rash        Medication List     TAKE these medications    ibuprofen 600 MG tablet Commonly known as: ADVIL Take 1 tablet (600 mg total) by mouth every 6 (six) hours as needed.   metoprolol tartrate 25 MG tablet Commonly known as: LOPRESSOR Take 1 tablet (25 mg total) by mouth 2 (two) times daily. What changed:  how much to take when to take this additional instructions   predniSONE 10 MG (21) Tbpk tablet Commonly known as: STERAPRED UNI-PAK 21 TAB As directed x 6 days   PRENATAL PO Take 1 tablet by mouth daily.   promethazine 25 MG tablet Commonly known as: PHENERGAN Take 1 tablet (25 mg total) by mouth every 6 (six) hours as needed for nausea or vomiting.   sertraline 50 MG tablet Commonly known as: ZOLOFT Take 1 tablet (50 mg total) by mouth daily. What changed: when to take this        Follow-up Information     Morris, Megan, DO Follow up in 1 week(s).   Specialty: Obstetrics and Gynecology Contact information: [address] [id number] [place] [facility] Follow up.   Specialty: Obstetrics and Gynecology Why: As needed for  emergencies Contact information: [address] [id number] [place] Certified Nurse-Midwife 02/08/2022  7:37 AM

## 2022-02-08 NOTE — MAU Note (Signed)
Pt says she del C/S on 01-30-2022 [facility] [place] home on 02-01-2022- all ok Then this morning - at 0130- underwear was wet  with light pink drainage from her C/S  scar. Scar is tender  Bottle feeding

## 2022-02-08 NOTE — Telephone Encounter (Signed)
Patient reports that she is doing ok. "I actually had to come back to the emergency room this morning because my incision is draining. They discharged me home and I'm going to follow up in the office." Patient states that the provider in MAU gave her instructions on what to watch for. RN told patient to reach out to her OB-GYN or come back to MAU if she has concerns prior to her appointment with her OB. Patient declines questions/concerns about her health and healing.  Patient reports that baby is doing great. Eating, peeing/pooping, and sleeping well. Baby sleeps in a crib or bassinet. RN reviewed ABC's of safe sleep with patient. Patient declines any questions or concerns about baby.  EPDS score is 1.  Sharyn Lull [facility] 02/08/22,1247

## 2022-02-11 ENCOUNTER — Encounter (HOSPITAL_COMMUNITY): Payer: Self-pay | Admitting: Obstetrics & Gynecology

## 2022-02-11 ENCOUNTER — Inpatient Hospital Stay (HOSPITAL_COMMUNITY)
Admission: AD | Admit: 2022-02-11 | Discharge: 2022-02-13 | DRG: 776 | Disposition: A | Discharge status: Home / Self Care | Payer: Commercial Managed Care - PPO | Attending: Obstetrics & Gynecology | Admitting: Obstetrics & Gynecology

## 2022-02-11 DIAGNOSIS — R519 Headache, unspecified: Secondary | ICD-10-CM | POA: Diagnosis not present

## 2022-02-11 DIAGNOSIS — O9089 Other complications of the puerperium, not elsewhere classified: Secondary | ICD-10-CM

## 2022-02-11 DIAGNOSIS — R030 Elevated blood-pressure reading, without diagnosis of hypertension: Secondary | ICD-10-CM | POA: Diagnosis not present

## 2022-02-11 DIAGNOSIS — O1405 Mild to moderate pre-eclampsia, complicating the puerperium: Secondary | ICD-10-CM | POA: Diagnosis not present

## 2022-02-11 DIAGNOSIS — O1495 Unspecified pre-eclampsia, complicating the puerperium: Secondary | ICD-10-CM | POA: Diagnosis not present

## 2022-02-11 HISTORY — DX: Unspecified pre-eclampsia, complicating the puerperium: O14.95

## 2022-02-11 LAB — CBC
HCT: 36.8 % (ref 36.0–46.0)
Hemoglobin: 12.1 g/dL (ref 12.0–15.0)
MCH: 30.4 pg (ref 26.0–34.0)
MCHC: 32.9 g/dL (ref 30.0–36.0)
MCV: 92.5 fL (ref 80.0–100.0)
Platelets: 339 10*3/uL (ref 150–400)
RBC: 3.98 MIL/uL (ref 3.87–5.11)
RDW: 12.8 % (ref 11.5–15.5)
WBC: 8.4 10*3/uL (ref 4.0–10.5)
nRBC: 0 % (ref 0.0–0.2)

## 2022-02-11 LAB — COMPREHENSIVE METABOLIC PANEL
ALT: 16 U/L (ref 0–44)
AST: 13 U/L — ABNORMAL LOW (ref 15–41)
Albumin: 3.3 g/dL — ABNORMAL LOW (ref 3.5–5.0)
Alkaline Phosphatase: 81 U/L (ref 38–126)
Anion gap: 5 (ref 5–15)
BUN: 14 mg/dL (ref 6–20)
CO2: 26 mmol/L (ref 22–32)
Calcium: 8.9 mg/dL (ref 8.9–10.3)
Chloride: 103 mmol/L (ref 98–111)
Creatinine, Ser: 0.85 mg/dL (ref 0.44–1.00)
GFR, Estimated: >60 mL/min (ref >60)
Glucose, Bld: 81 mg/dL (ref 70–99)
Potassium: 3.7 mmol/L (ref 3.5–5.1)
Sodium: 134 mmol/L — ABNORMAL LOW (ref 135–145)
Total Bilirubin: 0.3 mg/dL (ref 0.3–1.2)
Total Protein: 6.6 g/dL (ref 6.5–8.1)

## 2022-02-11 MED ORDER — MAGNESIUM SULFATE 40 GM/1000ML IV SOLN
2.0000 g/h | INTRAVENOUS | Status: DC
  Administered 2022-02-11 – 2022-02-12 (×2): 2 g/h via INTRAVENOUS
  Filled 2022-02-11 (×2): qty 1000

## 2022-02-11 MED ORDER — LABETALOL HCL 5 MG/ML IV SOLN: 80.0000 mg | INTRAVENOUS | Status: DC | PRN

## 2022-02-11 MED ORDER — LACTATED RINGERS IV SOLN: 125.0000 mL/h | INTRAVENOUS | Status: DC

## 2022-02-11 MED ORDER — MAGNESIUM SULFATE BOLUS VIA INFUSION
6.0000 g | Freq: Once | INTRAVENOUS | Status: AC
  Administered 2022-02-11: 6 g via INTRAVENOUS
  Filled 2022-02-11: qty 1000

## 2022-02-11 MED ORDER — CALCIUM CARBONATE ANTACID 500 MG PO CHEW: 2.0000 | CHEWABLE_TABLET | ORAL | Status: DC | PRN

## 2022-02-11 MED ORDER — HYDRALAZINE HCL 20 MG/ML IJ SOLN
10.0000 mg | INTRAMUSCULAR | Status: DC | PRN
  Administered 2022-02-11: 10 mg via INTRAVENOUS
  Filled 2022-02-11: qty 1

## 2022-02-11 MED ORDER — LABETALOL HCL 5 MG/ML IV SOLN: 40.0000 mg | INTRAVENOUS | Status: DC | PRN

## 2022-02-11 MED ORDER — DOCUSATE SODIUM 100 MG PO CAPS
100.0000 mg | ORAL_CAPSULE | Freq: Every day | ORAL | Status: DC
  Administered 2022-02-11: 100 mg via ORAL
  Filled 2022-02-11 (×3): qty 1

## 2022-02-11 MED ORDER — ACETAMINOPHEN 325 MG PO TABS
650.0000 mg | ORAL_TABLET | ORAL | Status: DC | PRN
  Administered 2022-02-11 – 2022-02-12 (×4): 650 mg via ORAL
  Filled 2022-02-11 (×4): qty 2

## 2022-02-11 MED ORDER — ACETAMINOPHEN-CAFFEINE 500-65 MG PO TABS
2.0000 | ORAL_TABLET | Freq: Once | ORAL | Status: AC
  Administered 2022-02-11: 2 via ORAL
  Filled 2022-02-11: qty 2

## 2022-02-11 MED ORDER — PRENATAL MULTIVITAMIN CH
1.0000 | ORAL_TABLET | Freq: Every day | ORAL | Status: DC
  Filled 2022-02-11: qty 1

## 2022-02-11 MED ORDER — SERTRALINE HCL 50 MG PO TABS
50.0000 mg | ORAL_TABLET | Freq: Every day | ORAL | Status: DC
  Administered 2022-02-11 – 2022-02-13 (×3): 50 mg via ORAL
  Filled 2022-02-11 (×3): qty 1

## 2022-02-11 MED ORDER — LABETALOL HCL 200 MG PO TABS
200.0000 mg | ORAL_TABLET | Freq: Two times a day (BID) | ORAL | Status: DC
  Administered 2022-02-11 – 2022-02-13 (×4): 200 mg via ORAL
  Filled 2022-02-11 (×4): qty 1

## 2022-02-11 MED ORDER — LACTATED RINGERS IV SOLN: INTRAVENOUS | Status: DC

## 2022-02-11 MED ORDER — LABETALOL HCL 5 MG/ML IV SOLN
20.0000 mg | INTRAVENOUS | Status: DC | PRN
  Filled 2022-02-11: qty 4

## 2022-02-11 MED ORDER — ZOLPIDEM TARTRATE 5 MG PO TABS: 5.0000 mg | ORAL_TABLET | Freq: Every evening | ORAL | Status: DC | PRN

## 2022-02-11 MED ORDER — IBUPROFEN 600 MG PO TABS
600.0000 mg | ORAL_TABLET | Freq: Four times a day (QID) | ORAL | Status: DC | PRN
  Administered 2022-02-11 – 2022-02-12 (×4): 600 mg via ORAL
  Filled 2022-02-11 (×4): qty 1

## 2022-02-11 NOTE — Plan of Care (Signed)
  Problem: Education: Goal: Knowledge of the prescribed therapeutic regimen will improve Outcome: Progressing Goal: Understanding of sexual limitations or changes related to disease process or condition will improve Outcome: Progressing Goal: Individualized Educational Video(s) Outcome: Progressing   Problem: Self-Concept: Goal: Communication of feelings regarding changes in body function or appearance will improve Outcome: Progressing   Problem: Skin Integrity: Goal: Demonstration of wound healing without infection will improve Outcome: Progressing   Problem: Education: Goal: Knowledge of condition will improve Outcome: Progressing Goal: Individualized Educational Video(s) Outcome: Progressing Goal: Individualized Newborn Educational Video(s) Outcome: Progressing   Problem: Activity: Goal: Will verbalize the importance of balancing activity with adequate rest periods Outcome: Progressing Goal: Ability to tolerate increased activity will improve Outcome: Progressing   Problem: Coping: Goal: Ability to identify and utilize available resources and services will improve Outcome: Progressing   Problem: Life Cycle: Goal: Chance of risk for complications during the postpartum period will decrease Outcome: Progressing   Problem: Role Relationship: Goal: Ability to demonstrate positive interaction with newborn will improve Outcome: Progressing   Problem: Skin Integrity: Goal: Demonstration of wound healing without infection will improve Outcome: Progressing   Problem: Education: Goal: Knowledge of disease or condition will improve Outcome: Progressing Goal: Knowledge of the prescribed therapeutic regimen will improve Outcome: Progressing   Problem: Fluid Volume: Goal: Peripheral tissue perfusion will improve Outcome: Progressing   Problem: Clinical Measurements: Goal: Complications related to disease process, condition or treatment will be avoided or  minimized Outcome: Progressing   Problem: Education: Goal: Knowledge of disease or condition will improve Outcome: Progressing Goal: Knowledge of the prescribed therapeutic regimen will improve Outcome: Progressing Goal: Individualized Educational Video(s) Outcome: Progressing   Problem: Clinical Measurements: Goal: Complications related to the disease process, condition or treatment will be avoided or minimized Outcome: Progressing   Problem: Education: Goal: Knowledge of General Education information will improve Description: Including pain rating scale, medication(s)/side effects and non-pharmacologic comfort measures Outcome: Progressing   Problem: Health Behavior/Discharge Planning: Goal: Ability to manage health-related needs will improve Outcome: Progressing   Problem: Clinical Measurements: Goal: Ability to maintain clinical measurements within normal limits will improve Outcome: Progressing Goal: Will remain free from infection Outcome: Progressing Goal: Diagnostic test results will improve Outcome: Progressing Goal: Respiratory complications will improve Outcome: Progressing Goal: Cardiovascular complication will be avoided Outcome: Progressing   Problem: Activity: Goal: Risk for activity intolerance will decrease Outcome: Progressing   Problem: Nutrition: Goal: Adequate nutrition will be maintained Outcome: Progressing   Problem: Coping: Goal: Level of anxiety will decrease Outcome: Progressing   Problem: Elimination: Goal: Will not experience complications related to bowel motility Outcome: Progressing Goal: Will not experience complications related to urinary retention Outcome: Progressing   Problem: Pain Managment: Goal: General experience of comfort will improve Outcome: Progressing   Problem: Safety: Goal: Ability to remain free from injury will improve Outcome: Progressing   Problem: Skin Integrity: Goal: Risk for impaired skin integrity  will decrease Outcome: Progressing

## 2022-02-11 NOTE — MAU Provider Note (Signed)
History     CSN: [id number]  Arrival date and time: 02/11/22 1222   Event Date/Time   First Provider Initiated Contact with Patient 02/11/22 1257      Chief Complaint  Patient presents with   Headache   Hypertension   HPI Raven [name] is a 32 y.o. year old G28P2002 female who is 12 days post-cesarean delivery/BTL sent to MAU from [facility] office reporting elevated BPs 162/98 and 172/102, H/A x 4 days. She has had no BP issues prior to delivery. She denies any visual disturbances. She reports having "the worst H/A of her life on 02/09/22." She reports the H/A is 5/10 today. She was taking Metoprolol 25 mg in AM and 12.5 mg hs, but she stopped taking it on 02/04/2022 due to low HR. She has had some trace swelling. She was seen in MAU on 02/08/2022 for a seroma at her C/S incision line. Per Dr. Lynnette Caffey, her wound check with WNL today. She receives [facility] with [facility].    OB History     Gravida  2   Para  2   Term  2   Preterm      AB      Living  2      SAB      IAB      Ectopic      Multiple  0   Live Births  2           Past Medical History:  Diagnosis Date   Autoimmune disease ([place]) 11/91/4782   Complication of anesthesia    Elevated liver enzymes    Endometriosis    Medical history non-contributory    Otosclerosis    PONV (postoperative nausea and vomiting)    SVT (supraventricular tachycardia)    Tachycardia     Past Surgical History:  Procedure Laterality Date   CESAREAN SECTION N/A 06/14/2014   Procedure: CESAREAN SECTION;  Surgeon: Bobbye Charleston, MD;  Location: [place] [facility];  Service: Obstetrics;  Laterality: N/A;   CESAREAN SECTION WITH BILATERAL TUBAL LIGATION Bilateral 01/30/2022   Procedure: REPEAT CESAREAN SECTION WITH BILATERAL TUBAL LIGATION EDC: 02-05-22 ALLERG: HYDROXYCHLOROQUINE, SULFA, WALNUT  PREVIOUS X 1;  Surgeon: [name], DO;  Location: [facility];  Service: Obstetrics;  Laterality: Bilateral;   EXCISION MASS  ABDOMINAL N/A 10/21/2019   Procedure: EXCISION MASS ABDOMINAL;  Surgeon: Clovis Riley, MD;  Location: [facility];  Service: General;  Laterality: N/A;   stebactomy      Family History  Problem Relation Age of Onset   Other Mother        Elevated liver enzymes   Diabetes Mother    Hypertension Mother    Thyroid disease Mother    Diabetes Father    Hypertension Father    Hyperlipidemia Father    Other Brother        Elevated liver enzymes   Asthma Paternal Aunt    Asthma Paternal Grandmother    Healthy Son    Allergic rhinitis Neg Hx    Eczema Neg Hx    Urticaria Neg Hx     Social History   Tobacco Use   Smoking status: Never   Smokeless tobacco: Never  Vaping Use   Vaping Use: Never used  Substance Use Topics   Alcohol use: No   Drug use: No    Allergies:  Allergies  Allergen Reactions   Macrobid [Nitrofurantoin] Hives   Other     Walnuts - burns  tongue    Hydroxychloroquine Rash and Hives   Sulfamethoxazole-Trimethoprim Rash    Medications Prior to Admission  Medication Sig Dispense Refill Last Dose   ibuprofen (ADVIL) 600 MG tablet Take 1 tablet (600 mg total) by mouth every 6 (six) hours as needed. 30 tablet 0 02/10/2022   predniSONE (STERAPRED UNI-PAK 21 TAB) 10 MG (21) TBPK tablet As directed x 6 days 21 tablet 0 02/11/2022   sertraline (ZOLOFT) 50 MG tablet Take 1 tablet (50 mg total) by mouth daily. (Patient taking differently: Take 50 mg by mouth at bedtime.) 90 tablet 1 02/10/2022   metoprolol tartrate (LOPRESSOR) 25 MG tablet Take 1 tablet (25 mg total) by mouth 2 (two) times daily. (Patient taking differently: Take 12.5-25 mg by mouth See admin instructions. Take 25 mg in the morning and 12.5 mg at night) 180 tablet 3    Prenatal Vit-Fe Fumarate-FA (PRENATAL PO) Take 1 tablet by mouth daily.      promethazine (PHENERGAN) 25 MG tablet Take 1 tablet (25 mg total) by mouth every 6 (six) hours as needed for nausea or vomiting. (Patient not taking: Reported on  08/23/2021) 30 tablet 0     Review of Systems  Constitutional: Negative.   HENT: Negative.    Eyes: Negative.   Cardiovascular: Negative.   Gastrointestinal: Negative.   Endocrine: Negative.   Allergic/Immunologic: Negative.   Neurological:  Positive for headaches (5/10).  Hematological: Negative.   Psychiatric/Behavioral: Negative.     Physical Exam   Patient Vitals for the past 24 hrs:  BP Temp Temp src Pulse Resp Weight  02/11/22 1530 (!) 146/99 -- -- 69 -- --  02/11/22 1516 (!) 142/88 -- -- 67 -- --  02/11/22 1501 135/79 -- -- 63 -- --  02/11/22 1446 (!) 156/85 -- -- 64 -- --  02/11/22 1439 (!) 167/94 -- -- -- -- --  02/11/22 1436 (!) 167/94 -- -- (!) 59 -- --  02/11/22 1416 (!) 154/88 -- -- 62 -- --  02/11/22 1401 (!) 157/95 -- -- (!) 58 -- --  02/11/22 1345 (!) 170/93 -- -- (!) 59 -- --  02/11/22 1337 (!) 173/101 -- -- 62 -- --  02/11/22 1302 (!) 173/93 -- -- 65 16 --  02/11/22 1247 (!) 161/100 -- -- 64 -- --  02/11/22 1235 (!) 167/98 98.7 F (37.1 C) Oral 64 14 99 kg   Physical Exam Vitals and nursing note reviewed. Exam conducted with a chaperone present.  Constitutional:      Appearance: Normal appearance. She is obese.  Cardiovascular:     Rate and Rhythm: Normal rate.  Pulmonary:     Effort: Pulmonary effort is normal.     Breath sounds: Normal breath sounds.  Abdominal:     Palpations: Abdomen is soft.  Genitourinary:    Comments: Deferred Musculoskeletal:     Cervical back: Normal range of motion.  Skin:    General: Skin is warm and dry.  Neurological:     Mental Status: She is alert and oriented to person, place, and time.  Psychiatric:        Mood and Affect: Mood normal.        Behavior: Behavior normal.        Thought Content: Thought content normal.        Judgment: Judgment normal.     MAU Course  Procedures  MDM CCUA CBC CMP P/C Ratio Serial BP's  Insert IV Labetalol Protocol Excedrin Tension Headache x 2 tablets  Results  for orders placed or performed during the hospital encounter of 02/11/22 (from the past 24 hour(s))  Comprehensive metabolic panel     Status: Abnormal   Collection Time: 02/11/22 12:44 PM  Result Value Ref Range   Sodium 134 (L) 135 - 145 mmol/L   Potassium 3.7 3.5 - 5.1 mmol/L   Chloride 103 98 - 111 mmol/L   CO2 26 22 - 32 mmol/L   Glucose, Bld 81 70 - 99 mg/dL   BUN 14 6 - 20 mg/dL   Creatinine, Ser 0.85 0.44 - 1.00 mg/dL   Calcium 8.9 8.9 - 10.3 mg/dL   Total Protein 6.6 6.5 - 8.1 g/dL   Albumin 3.3 (L) 3.5 - 5.0 g/dL   AST 13 (L) 15 - 41 U/L   ALT 16 0 - 44 U/L   Alkaline Phosphatase 81 38 - 126 U/L   Total Bilirubin 0.3 0.3 - 1.2 mg/dL   GFR, Estimated >60 >60 mL/min   Anion gap 5 5 - 15  CBC     Status: None   Collection Time: 02/11/22 12:44 PM  Result Value Ref Range   WBC 8.4 4.0 - 10.5 K/uL   RBC 3.98 3.87 - 5.11 MIL/uL   Hemoglobin 12.1 12.0 - 15.0 g/dL   HCT 36.8 36.0 - 46.0 %   MCV 92.5 80.0 - 100.0 fL   MCH 30.4 26.0 - 34.0 pg   MCHC 32.9 30.0 - 36.0 g/dL   RDW 12.8 11.5 - 15.5 %   Platelets 339 150 - 400 K/uL   nRBC 0.0 0.0 - 0.2 %     *Consult with Dr. Ernestina Patches @ 1325 - notified of patient's complaints, assessments, lab results, tx plan call Dr. Lynnette Caffey to admit to [facility] for MgSO4 infusion - agrees with plan  TC to Dr. Lynnette Caffey @ 1530 - notified of patient's complaints, assessments, lab results, tx plan to admit to [facility] for MgSO4 infusion - agrees with plan and will put in orders  Assessment and Plan  1. Preeclampsia in postpartum period  2. Postpartum headache - Admit to OBSCU - Routine PP admission orders - Dr. Lynnette Caffey assumes care of patient at 1530 - See Dr. Lynnette Caffey' H&P documentation  Raven [name], [place] 02/11/2022, 12:57 PM

## 2022-02-11 NOTE — H&P (Addendum)
Raven [name] is an 32 y.o. female G57P2 POD#12 s/p repeat C/S with BTL with elevated BPs sent from office today.  Patient reports HA x 4 days; better today than initially.  HA is not positional.  No CP/SOB, RUQ pain or visual disturbance.  On arrival to MAU, patient had multiple severe range BPs which were treated with IV hydralazine x 1 dose; bradycardia prevented labetalol use. Pre-E labs wnl.  She has anxiety and h/o PPD; taking sertraline 50 mg daily and feeling well.  Patient also has h/o SVT and took metoprolol 25 mg in AM and 12.5 mg in PM until 12/26 when she discontinued secondary to bradycardia.  Patient has otosclerosis and wear bilateral hearing aids.  Patient has h/o GHTN; normal BPs during most recent pregnancy and postpartum period.  Of note, patient was in MAU 12/30 for incision check and was normotensive at that time.       Pertinent Gynecological History: Menses:  postpartum Bleeding: lochia Contraception: tubal ligation DES exposure: unknown Blood transfusions: none Sexually transmitted diseases: no past history Previous GYN Procedures:  C/S x 2   Last mammogram:  n/a  Date: n/a Last pap:  n/a  Date: n/a OB History: G2, P2   Menstrual History: Menarche age: n/a No LMP recorded.    Past Medical History:  Diagnosis Date   Autoimmune disease ([place]) 93/79/0240   Complication of anesthesia    Elevated liver enzymes    Endometriosis    Medical history non-contributory    Otosclerosis    PONV (postoperative nausea and vomiting)    SVT (supraventricular tachycardia)    Tachycardia     Past Surgical History:  Procedure Laterality Date   CESAREAN SECTION N/A 06/14/2014   Procedure: CESAREAN SECTION;  Surgeon: Bobbye Charleston, MD;  Location: [facility];  Service: Obstetrics;  Laterality: N/A;   CESAREAN SECTION WITH BILATERAL TUBAL LIGATION Bilateral 01/30/2022   Procedure: REPEAT CESAREAN SECTION WITH BILATERAL TUBAL LIGATION EDC: 02-05-22 ALLERG: HYDROXYCHLOROQUINE,  SULFA, WALNUT  PREVIOUS X 1;  Surgeon: [name], DO;  Location: [facility];  Service: Obstetrics;  Laterality: Bilateral;   EXCISION MASS ABDOMINAL N/A 10/21/2019   Procedure: EXCISION MASS ABDOMINAL;  Surgeon: Clovis Riley, MD;  Location: [facility];  Service: General;  Laterality: N/A;   stebactomy      Family History  Problem Relation Age of Onset   Other Mother        Elevated liver enzymes   Diabetes Mother    Hypertension Mother    Thyroid disease Mother    Diabetes Father    Hypertension Father    Hyperlipidemia Father    Other Brother        Elevated liver enzymes   Asthma Paternal Aunt    Asthma Paternal Grandmother    Healthy Son    Allergic rhinitis Neg Hx    Eczema Neg Hx    Urticaria Neg Hx     Social History:  reports that she has never smoked. She has never used smokeless tobacco. She reports that she does not drink alcohol and does not use drugs.  Allergies:  Allergies  Allergen Reactions   Macrobid [Nitrofurantoin] Hives   Other     Walnuts - burns tongue    Hydroxychloroquine Rash and Hives   Sulfamethoxazole-Trimethoprim Rash    Medications Prior to Admission  Medication Sig Dispense Refill Last Dose   ibuprofen (ADVIL) 600 MG tablet Take 1 tablet (600 mg total) by mouth every 6 (six) hours  as needed. 30 tablet 0 02/10/2022   predniSONE (STERAPRED UNI-PAK 21 TAB) 10 MG (21) TBPK tablet As directed x 6 days 21 tablet 0 02/11/2022   sertraline (ZOLOFT) 50 MG tablet Take 1 tablet (50 mg total) by mouth daily. (Patient taking differently: Take 50 mg by mouth at bedtime.) 90 tablet 1 02/10/2022   metoprolol tartrate (LOPRESSOR) 25 MG tablet Take 1 tablet (25 mg total) by mouth 2 (two) times daily. (Patient taking differently: Take 12.5-25 mg by mouth See admin instructions. Take 25 mg in the morning and 12.5 mg at night) 180 tablet 3    Prenatal Vit-Fe Fumarate-FA (PRENATAL PO) Take 1 tablet by mouth daily.      promethazine (PHENERGAN) 25 MG tablet Take 1  tablet (25 mg total) by mouth every 6 (six) hours as needed for nausea or vomiting. (Patient not taking: Reported on 08/23/2021) 30 tablet 0     Review of Systems  Blood pressure (!) 146/99, pulse 69, temperature 98.7 F (37.1 C), temperature source Oral, resp. rate 16, weight 99 kg, not currently breastfeeding. Physical Exam Constitutional:      Appearance: She is well-developed.  HENT:     Head: Normocephalic and atraumatic.  Pulmonary:     Effort: Pulmonary effort is normal.  Abdominal:     Palpations: Abdomen is soft.  Skin:    General: Skin is warm and dry.  Neurological:     Mental Status: She is alert.  Psychiatric:        Mood and Affect: Mood normal.        Behavior: Behavior normal.     Results for orders placed or performed during the hospital encounter of 02/11/22 (from the past 24 hour(s))  Comprehensive metabolic panel     Status: Abnormal   Collection Time: 02/11/22 12:44 PM  Result Value Ref Range   Sodium 134 (L) 135 - 145 mmol/L   Potassium 3.7 3.5 - 5.1 mmol/L   Chloride 103 98 - 111 mmol/L   CO2 26 22 - 32 mmol/L   Glucose, Bld 81 70 - 99 mg/dL   BUN 14 6 - 20 mg/dL   Creatinine, Ser 0.85 0.44 - 1.00 mg/dL   Calcium 8.9 8.9 - 10.3 mg/dL   Total Protein 6.6 6.5 - 8.1 g/dL   Albumin 3.3 (L) 3.5 - 5.0 g/dL   AST 13 (L) 15 - 41 U/L   ALT 16 0 - 44 U/L   Alkaline Phosphatase 81 38 - 126 U/L   Total Bilirubin 0.3 0.3 - 1.2 mg/dL   GFR, Estimated >60 >60 mL/min   Anion gap 5 5 - 15  CBC     Status: None   Collection Time: 02/11/22 12:44 PM  Result Value Ref Range   WBC 8.4 4.0 - 10.5 K/uL   RBC 3.98 3.87 - 5.11 MIL/uL   Hemoglobin 12.1 12.0 - 15.0 g/dL   HCT 36.8 36.0 - 46.0 %   MCV 92.5 80.0 - 100.0 fL   MCH 30.4 26.0 - 34.0 pg   MCHC 32.9 30.0 - 36.0 g/dL   RDW 12.8 11.5 - 15.5 %   Platelets 339 150 - 400 K/uL   nRBC 0.0 0.0 - 0.2 %    No results found.  Assessment/Plan: 32yo POD#12 with postpartum pre-eclampsia -Admit for magnesium  sulfate -Will consider addition of anti-hypertensives prn -AM labs -HA treatment prn  [name] 02/11/2022, 3:40 PM

## 2022-02-11 NOTE — Progress Notes (Signed)
Attempted IV access 3 times unsuccessfully by 2 different RN's and CNM. IV team consult order placed. Sunday Corn CNM aware of severe range BP's and inability to obtain IV access./KEC/

## 2022-02-11 NOTE — MAU Note (Signed)
.  Raven [name] is a 32 y.o. postpartum c/s here in MAU reporting: Severe range BP in the office and a headache for the last four days. States she has not had BP problems prior to this visit. Denies visual disturbances.   Pain score: 6 Vitals:   02/11/22 1235  BP: (!) 167/98  Pulse: 64  Resp: 14  Temp: 98.7 F (37.1 C)

## 2022-02-12 ENCOUNTER — Other Ambulatory Visit: Payer: Self-pay

## 2022-02-12 LAB — CBC
HCT: 37.5 % (ref 36.0–46.0)
Hemoglobin: 12.2 g/dL (ref 12.0–15.0)
MCH: 29.8 pg (ref 26.0–34.0)
MCHC: 32.5 g/dL (ref 30.0–36.0)
MCV: 91.7 fL (ref 80.0–100.0)
Platelets: 312 10*3/uL (ref 150–400)
RBC: 4.09 MIL/uL (ref 3.87–5.11)
RDW: 13 % (ref 11.5–15.5)
WBC: 7.2 10*3/uL (ref 4.0–10.5)
nRBC: 0 % (ref 0.0–0.2)

## 2022-02-12 LAB — COMPREHENSIVE METABOLIC PANEL
ALT: 16 U/L (ref 0–44)
AST: 18 U/L (ref 15–41)
Albumin: 3.2 g/dL — ABNORMAL LOW (ref 3.5–5.0)
Alkaline Phosphatase: 78 U/L (ref 38–126)
Anion gap: 12 (ref 5–15)
BUN: 12 mg/dL (ref 6–20)
CO2: 26 mmol/L (ref 22–32)
Calcium: 7.1 mg/dL — ABNORMAL LOW (ref 8.9–10.3)
Chloride: 97 mmol/L — ABNORMAL LOW (ref 98–111)
Creatinine, Ser: 0.91 mg/dL (ref 0.44–1.00)
GFR, Estimated: >60 mL/min (ref >60)
Glucose, Bld: 100 mg/dL — ABNORMAL HIGH (ref 70–99)
Potassium: 3.6 mmol/L (ref 3.5–5.1)
Sodium: 135 mmol/L (ref 135–145)
Total Bilirubin: 0.5 mg/dL (ref 0.3–1.2)
Total Protein: 6.2 g/dL — ABNORMAL LOW (ref 6.5–8.1)

## 2022-02-12 NOTE — Progress Notes (Signed)
Postpartum Progress Note  Postpartum Day 3 s/p Cesarean section, admitted HD#2 with postpartum preeclampsia.  Subjective:  Patient reports no overnight events. She reports since starting magnesium, headache worsened and feels flushed, tired, heavy eyes.   Denies pain otherwise.   Objective: Blood pressure 125/81, pulse 82, temperature 97.6 F (36.4 C), temperature source Oral, resp. rate 18, height '5\' 4"'$  (1.626 m), weight 98.4 kg, SpO2 100 %, not currently breastfeeding.  Physical Exam:  General: alert and no distress Chest: nonlabored breathing Abdomen: soft, nontender DVT Evaluation: No evidence of DVT seen on physical exam.  Recent Labs    02/11/22 1244 02/12/22 0422  HGB 12.1 12.2  HCT 36.8 37.5    Assessment/Plan: Postpartum Preeclampsia Continue magnesium infusion 24 hrs - complete after 4 pm. S/p 1x IV hydralazine.  BP normotensive overnight Labetalol 200 mg BID scheduled CBC and CMP reassuring this AM Suspect ongoing symptoms are related to magnesium infusion.  Dispo: continue HA relief, will consider discharge after magnesium infusion this afternoon.    LOS: 1 day   Carlyon Shadow 02/12/2022, 7:48 AM

## 2022-02-13 ENCOUNTER — Other Ambulatory Visit (HOSPITAL_COMMUNITY): Payer: Self-pay

## 2022-02-13 MED ORDER — ACETAMINOPHEN 325 MG PO TABS
650.0000 mg | ORAL_TABLET | ORAL | 0 refills | Status: DC | PRN
  Filled 2022-02-13: qty 90, 8d supply, fill #0

## 2022-02-13 MED ORDER — LABETALOL HCL 200 MG PO TABS
200.0000 mg | ORAL_TABLET | Freq: Two times a day (BID) | ORAL | 1 refills | Status: DC
  Filled 2022-02-13: qty 60, 30d supply, fill #0
  Filled 2022-03-10: qty 60, 30d supply, fill #1

## 2022-02-13 NOTE — Discharge Summary (Signed)
Physician Discharge Summary  Patient ID: Raven [name] MRN: [id number] DOB/AGE: Jul 30, 1990 32 y.o.  Admit date: 02/11/2022 Discharge date: 02/13/2022  Admission Diagnoses: post partum preeclampsia  Discharge Diagnoses:  Principal Problem:   Preeclampsia in postpartum period Active Problems:   Pre-eclampsia in postpartum period   Discharged Condition: good  Hospital Course: admitted and received IV magnesium sulfate for seizure prophylaxis. Metropolol stopped and labetalol '200mg'$  BID started with good BP control. After 24 hours magnesium discontinued. Labs normal.  Consults: None  Significant Diagnostic Studies: labs:  Results for orders placed or performed during the hospital encounter of 02/11/22 (from the past 72 hour(s))  Comprehensive metabolic panel     Status: Abnormal   Collection Time: 02/11/22 12:44 PM  Result Value Ref Range   Sodium 134 (L) 135 - 145 mmol/L   Potassium 3.7 3.5 - 5.1 mmol/L   Chloride 103 98 - 111 mmol/L   CO2 26 22 - 32 mmol/L   Glucose, Bld 81 70 - 99 mg/dL    Comment: Glucose reference range applies only to samples taken after fasting for at least 8 hours.   BUN 14 6 - 20 mg/dL   Creatinine, Ser 0.85 0.44 - 1.00 mg/dL   Calcium 8.9 8.9 - 10.3 mg/dL   Total Protein 6.6 6.5 - 8.1 g/dL   Albumin 3.3 (L) 3.5 - 5.0 g/dL   AST 13 (L) 15 - 41 U/L   ALT 16 0 - 44 U/L   Alkaline Phosphatase 81 38 - 126 U/L   Total Bilirubin 0.3 0.3 - 1.2 mg/dL   GFR, Estimated >60 >60 mL/min    Comment: (NOTE) Calculated using the CKD-EPI Creatinine Equation (2021)    Anion gap 5 5 - 15    Comment: Performed at [facility], [place] [address] [place] [address]  CBC     Status: None   Collection Time: 02/11/22 12:44 PM  Result Value Ref Range   WBC 8.4 4.0 - 10.5 K/uL   RBC 3.98 3.87 - 5.11 MIL/uL   Hemoglobin 12.1 12.0 - 15.0 g/dL   HCT 36.8 36.0 - 46.0 %   MCV 92.5 80.0 - 100.0 fL   MCH 30.4 26.0 - 34.0 pg   MCHC 32.9 30.0 - 36.0 g/dL   RDW  12.8 11.5 - 15.5 %   Platelets 339 150 - 400 K/uL   nRBC 0.0 0.0 - 0.2 %    Comment: Performed at [facility], [place] [address] [place], [place] [address]  Comprehensive metabolic panel     Status: Abnormal   Collection Time: 02/12/22  4:22 AM  Result Value Ref Range   Sodium 135 135 - 145 mmol/L   Potassium 3.6 3.5 - 5.1 mmol/L   Chloride 97 (L) 98 - 111 mmol/L   CO2 26 22 - 32 mmol/L   Glucose, Bld 100 (H) 70 - 99 mg/dL    Comment: Glucose reference range applies only to samples taken after fasting for at least 8 hours.   BUN 12 6 - 20 mg/dL   Creatinine, Ser 0.91 0.44 - 1.00 mg/dL   Calcium 7.1 (L) 8.9 - 10.3 mg/dL   Total Protein 6.2 (L) 6.5 - 8.1 g/dL   Albumin 3.2 (L) 3.5 - 5.0 g/dL   AST 18 15 - 41 U/L   ALT 16 0 - 44 U/L   Alkaline Phosphatase 78 38 - 126 U/L   Total Bilirubin 0.5 0.3 - 1.2 mg/dL   GFR,  Estimated >60 >60 mL/min    Comment: (NOTE) Calculated using the CKD-EPI Creatinine Equation (2021)    Anion gap 12 5 - 15    Comment: Performed at [facility], [place] [address], [place], [place] [address]  CBC     Status: None   Collection Time: 02/12/22  4:22 AM  Result Value Ref Range   WBC 7.2 4.0 - 10.5 K/uL   RBC 4.09 3.87 - 5.11 MIL/uL   Hemoglobin 12.2 12.0 - 15.0 g/dL   HCT 37.5 36.0 - 46.0 %   MCV 91.7 80.0 - 100.0 fL   MCH 29.8 26.0 - 34.0 pg   MCHC 32.5 30.0 - 36.0 g/dL   RDW 13.0 11.5 - 15.5 %   Platelets 312 150 - 400 K/uL   nRBC 0.0 0.0 - 0.2 %    Comment: Performed at [facility], [place] [address], [place], [place] [address]     Treatments: IV magnesium sulfate  Discharge Exam: Blood pressure (!) 125/91, pulse 77, temperature 98.5 F (36.9 C), temperature source Oral, resp. rate 17, height '5\' 4"'$  (1.626 m), weight 98.4 kg, SpO2 100 %, not currently breastfeeding. General appearance: alert, cooperative, and no distress GI: incision healing well  Disposition:  There are no questions and answers to display.             Signed: Shon Millet II 02/13/2022, 10:38 AM

## 2022-02-13 NOTE — Progress Notes (Signed)
Discharge instructions and prescriptions given to pt. Discussed signs and symptoms to report to the MD, upcoming appointments, and meds. Pt verbalizes understanding and has no questions or concerns at this time. Pt discharged home from hospital in stable condition.

## 2022-02-13 NOTE — Progress Notes (Signed)
Feels good, wants to go home  Today's Vitals   02/13/22 0419 02/13/22 0514 02/13/22 0615 02/13/22 0817  BP: 120/82   (!) 125/91  Pulse: 79   77  Resp: 18   17  Temp: 98.4 F (36.9 C)   98.5 F (36.9 C)  TempSrc: Oral   Oral  SpO2: 97%   100%  Weight:      Height:      PainSc:  Asleep Asleep    Body mass index is 37.25 kg/m.   Incision healing well  A/P: D/C home         Labetalol '200mg'$  BID         BP check in office 4-5 days         Instructions discussed

## 2022-03-05 ENCOUNTER — Encounter: Payer: Self-pay | Admitting: Family Medicine

## 2022-03-05 ENCOUNTER — Other Ambulatory Visit: Payer: Self-pay | Admitting: Family Medicine

## 2022-03-05 ENCOUNTER — Ambulatory Visit: Payer: Commercial Managed Care - PPO | Admitting: Family Medicine

## 2022-03-05 DIAGNOSIS — R7301 Impaired fasting glucose: Secondary | ICD-10-CM

## 2022-03-05 DIAGNOSIS — E8881 Metabolic syndrome: Secondary | ICD-10-CM

## 2022-03-05 DIAGNOSIS — E781 Pure hyperglyceridemia: Secondary | ICD-10-CM | POA: Diagnosis not present

## 2022-03-05 DIAGNOSIS — Z713 Dietary counseling and surveillance: Secondary | ICD-10-CM

## 2022-03-05 MED ORDER — ZEPBOUND 5 MG/0.5ML ~~LOC~~ SOAJ
5.0000 mg | SUBCUTANEOUS | 0 refills | Status: DC
  Filled 2022-03-24: qty 2, 28d supply, fill #0

## 2022-03-05 MED ORDER — ZEPBOUND 10 MG/0.5ML ~~LOC~~ SOAJ: 10.0000 mg | SUBCUTANEOUS | 0 refills | Status: DC

## 2022-03-05 MED ORDER — ZEPBOUND 7.5 MG/0.5ML ~~LOC~~ SOAJ
7.5000 mg | SUBCUTANEOUS | 0 refills | Status: DC
  Filled 2022-04-28: qty 2, 28d supply, fill #0

## 2022-03-05 MED ORDER — ZEPBOUND 2.5 MG/0.5ML ~~LOC~~ SOAJ
2.5000 mg | SUBCUTANEOUS | 0 refills | Status: DC
  Filled 2022-03-10: qty 2, 28d supply, fill #0

## 2022-03-05 NOTE — Progress Notes (Signed)
Subjective: CC: Obesity PCP: Raven Norlander, DO QPR:[id number] L Raven [name] is a 32 y.o. female presenting to clinic today for:  1.  Morbid obesity associated with hypertension Patient reports that she has been unsuccessful with maintaining weight loss with phentermine.  She regained her weight very quickly after discontinuing.  She has been on weight watchers but again could not maintain her lifestyle changes.  She really tried increasing physical exercise but this resulted in SVT and therefore she cannot overly exert herself now.  She was treated with Darcel Bayley off label several years ago and this did result in weight loss for her.  Her weight has been 245 pounds as her max weight.  She would like to try and pursues that bound.  She has a BTL in place for contraception and is not breast-feeding.   ROS: Per HPI  Allergies  Allergen Reactions   Macrobid [Nitrofurantoin] Hives   Other     Walnuts - burns tongue    Hydroxychloroquine Rash and Hives   Sulfamethoxazole-Trimethoprim Rash   Past Medical History:  Diagnosis Date   Autoimmune disease ([place]) 99/35/7017   Complication of anesthesia    Elevated liver enzymes    Endometriosis    Medical history non-contributory    Otosclerosis    PONV (postoperative nausea and vomiting)    SVT (supraventricular tachycardia)    Tachycardia     Current Outpatient Medications:    sertraline (ZOLOFT) 50 MG tablet, Take 1 tablet (50 mg total) by mouth daily. (Patient taking differently: Take 50 mg by mouth at bedtime.), Disp: 90 tablet, Rfl: 1   labetalol (NORMODYNE) 200 MG tablet, Take 1 tablet (200 mg total) by mouth 2 (two) times daily., Disp: 60 tablet, Rfl: 1 Social History   Socioeconomic History   Marital status: Married    Spouse name: Not on file   Number of children: Not on file   Years of education: Not on file   Highest education level: Not on file  Occupational History   Not on file  Tobacco Use   Smoking status: Never    Smokeless tobacco: Never  Vaping Use   Vaping Use: Never used  Substance and Sexual Activity   Alcohol use: No   Drug use: No   Sexual activity: Not Currently  Other Topics Concern   Not on file  Social History Narrative   Not on file   Social Determinants of Health   Financial Resource Strain: Not on file  Food Insecurity: No Food Insecurity (02/11/2022)   Hunger Vital Sign    Worried About Running Out of Food in the Last Year: Never true    Ran Out of Food in the Last Year: Never true  Transportation Needs: No Transportation Needs (02/12/2022)   PRAPARE - Hydrologist (Medical): No    Lack of Transportation (Non-Medical): No  Physical Activity: Not on file  Stress: Not on file  Social Connections: Not on file  Intimate Partner Violence: Not At Risk (02/12/2022)   Humiliation, Afraid, Rape, and Kick questionnaire    Fear of Current or Ex-Partner: No    Emotionally Abused: No    Physically Abused: No    Sexually Abused: No   Family History  Problem Relation Age of Onset   Other Mother        Elevated liver enzymes   Diabetes Mother    Hypertension Mother    Thyroid disease Mother    Diabetes Father  Hypertension Father    Hyperlipidemia Father    Other Brother        Elevated liver enzymes   Asthma Paternal Aunt    Asthma Paternal Grandmother    Healthy Son    Allergic rhinitis Neg Hx    Eczema Neg Hx    Urticaria Neg Hx     Objective: Office vital signs reviewed. BP 122/78   Pulse 99   Temp 97.9 F (36.6 C)   Ht '5\' 4"'$  (1.626 m)   Wt 222 lb (100.7 kg)   SpO2 97%   Breastfeeding No   BMI 38.11 kg/m   Physical Examination:  General: Awake, alert, well nourished, No acute distress HEENT:sclera white, MMM Cardio: regular rate and rhythm, S1S2 heard, no murmurs appreciated Pulm: clear to auscultation bilaterally, no wheezes, rhonchi or rales; normal work of breathing on room air    Assessment/ Plan: 32 y.o. female   Morbid  obesity ([place]) - Plan: Lipid panel, CMP14+EGFR, TSH, T4, free, Bayer DCA Hb A1c Waived, tirzepatide (ZEPBOUND) 2.5 MG/0.5ML Pen, tirzepatide (ZEPBOUND) 7.5 MG/0.5ML Pen, tirzepatide (ZEPBOUND) 10 MG/0.5ML Pen, tirzepatide (ZEPBOUND) 5 MG/0.5ML Pen  Encounter for weight loss counseling - Plan: Lipid panel, CMP14+EGFR, TSH, T4, free, Bayer DCA Hb A1c Waived, tirzepatide (ZEPBOUND) 2.5 MG/0.5ML Pen, tirzepatide (ZEPBOUND) 7.5 MG/0.5ML Pen, tirzepatide (ZEPBOUND) 10 MG/0.5ML Pen, tirzepatide (ZEPBOUND) 5 MG/0.5ML Pen  Impaired fasting glucose - Plan: tirzepatide (ZEPBOUND) 2.5 MG/0.5ML Pen, tirzepatide (ZEPBOUND) 7.5 MG/0.5ML Pen, tirzepatide (ZEPBOUND) 10 MG/0.5ML Pen, tirzepatide (ZEPBOUND) 5 MG/0.5ML Pen  Hypertriglyceridemia - Plan: tirzepatide (ZEPBOUND) 2.5 MG/0.5ML Pen, tirzepatide (ZEPBOUND) 7.5 MG/0.5ML Pen, tirzepatide (ZEPBOUND) 10 MG/0.5ML Pen, tirzepatide (ZEPBOUND) 5 DJ/2.4QA Pen  Metabolic syndrome - Plan: tirzepatide (ZEPBOUND) 2.5 MG/0.5ML Pen, tirzepatide (ZEPBOUND) 7.5 MG/0.5ML Pen, tirzepatide (ZEPBOUND) 10 MG/0.5ML Pen, tirzepatide (ZEPBOUND) 5 MG/0.5ML Pen  Start that bound.  No apparent contraindications to use.  Would like to see her back in the next 3 to 4 months for weight recheck.    Patient's BMI is >30 mg/m2.  Patient's current BMI is Body mass index is 38.11 kg/m.Marland Kitchen  Patient is currently enrolled in a healthy eating plan along with encouraged exercise.  Patient has FAILED to phentermine, weight watchers.  Patient does not have a personal or family history of medullary thyroid carcinoma (MTC) or Multiple Endocrine Neoplasia syndrome type 2 (MEN 2).   No orders of the defined types were placed in this encounter.  No orders of the defined types were placed in this encounter.    Raven Norlander, DO [place] [phone]

## 2022-03-05 NOTE — Patient Instructions (Signed)
Tips for success with Zepbound (and by success, how not to be super sick on your stomach): Eat small meals AVOID heavy foods (fried/ high in carbs like bread, pasta, rice) AVOID carbonated beverages (soda/ beer, as these can increase bloating) DOUBLE your water intake (will help you avoid constipation/ dehydration)  Zepbound CAN cause: Nausea Abdominal pain Increased acid reflux (sometimes presents as "sour burps") Constipation OR Diarrhea Fatigue (especially when you first start it)

## 2022-03-06 ENCOUNTER — Other Ambulatory Visit: Payer: Commercial Managed Care - PPO

## 2022-03-06 DIAGNOSIS — Z713 Dietary counseling and surveillance: Secondary | ICD-10-CM

## 2022-03-06 LAB — BAYER DCA HB A1C WAIVED: HB A1C (BAYER DCA - WAIVED): 5.4 % (ref 4.8–5.6)

## 2022-03-06 NOTE — Telephone Encounter (Signed)
Name from pharmacy: ZEPBOUND 7.5 MG/0.5 ML PEN  Pharmacy comment: Alternative Requested:NOT COVERED BY INSURANCE.   Name from pharmacy: ZEPBOUND 2.5 MG/0.5 ML PEN   Pharmacy comment: Alternative Requested:NOT COVERED BY INSURANCE.

## 2022-03-07 ENCOUNTER — Telehealth: Payer: Self-pay | Admitting: *Deleted

## 2022-03-07 ENCOUNTER — Other Ambulatory Visit: Payer: Self-pay | Admitting: Family Medicine

## 2022-03-07 DIAGNOSIS — E038 Other specified hypothyroidism: Secondary | ICD-10-CM

## 2022-03-07 LAB — LIPID PANEL
Chol/HDL Ratio: 5 ratio — ABNORMAL HIGH (ref 0.0–4.4)
Cholesterol, Total: 241 mg/dL — ABNORMAL HIGH (ref 100–199)
HDL: 48 mg/dL (ref >39)
LDL Chol Calc (NIH): 162 mg/dL — ABNORMAL HIGH (ref 0–99)
Triglycerides: 168 mg/dL — ABNORMAL HIGH (ref 0–149)
VLDL Cholesterol Cal: 31 mg/dL (ref 5–40)

## 2022-03-07 LAB — CMP14+EGFR
ALT: 24 IU/L (ref 0–32)
AST: 20 IU/L (ref 0–40)
Albumin/Globulin Ratio: 1.7 (ref 1.2–2.2)
Albumin: 4 g/dL (ref 3.9–4.9)
Alkaline Phosphatase: 112 IU/L (ref 44–121)
BUN/Creatinine Ratio: 13 (ref 9–23)
BUN: 11 mg/dL (ref 6–20)
Bilirubin Total: 0.2 mg/dL (ref 0.0–1.2)
CO2: 20 mmol/L (ref 20–29)
Calcium: 9.1 mg/dL (ref 8.7–10.2)
Chloride: 104 mmol/L (ref 96–106)
Creatinine, Ser: 0.88 mg/dL (ref 0.57–1.00)
Globulin, Total: 2.3 g/dL (ref 1.5–4.5)
Glucose: 87 mg/dL (ref 70–99)
Potassium: 4.4 mmol/L (ref 3.5–5.2)
Sodium: 139 mmol/L (ref 134–144)
Total Protein: 6.3 g/dL (ref 6.0–8.5)
eGFR: 89 mL/min/{1.73_m2} (ref >59)

## 2022-03-07 LAB — TSH: TSH: 1.37 u[IU]/mL (ref 0.450–4.500)

## 2022-03-07 LAB — T4, FREE: Free T4: 0.77 ng/dL — ABNORMAL LOW (ref 0.82–1.77)

## 2022-03-07 MED ORDER — LEVOTHYROXINE SODIUM 50 MCG PO TABS: 50.0000 ug | ORAL_TABLET | Freq: Every day | ORAL | 3 refills | Status: DC

## 2022-03-07 NOTE — Telephone Encounter (Signed)
Please inform pt

## 2022-03-07 NOTE — Telephone Encounter (Signed)
Nina Mondor (Key: [id number]) PA Case ID #: [id number] Rx #: [id number]  Status: Sent to Plan   Drug: Zepbound 2.'5MG'$ /0.5ML pen-injectors

## 2022-03-10 ENCOUNTER — Other Ambulatory Visit (HOSPITAL_COMMUNITY): Payer: Self-pay

## 2022-03-10 ENCOUNTER — Telehealth: Payer: Self-pay | Admitting: Family Medicine

## 2022-03-10 MED ORDER — ZEPBOUND 2.5 MG/0.5ML ~~LOC~~ SOAJ: 2.5000 mg | SUBCUTANEOUS | 0 refills | Status: DC

## 2022-03-10 MED ORDER — LABETALOL HCL 200 MG PO TABS
200.0000 mg | ORAL_TABLET | Freq: Three times a day (TID) | ORAL | 0 refills | Status: DC
  Filled 2022-03-10: qty 270, 90d supply, fill #0

## 2022-03-10 NOTE — Telephone Encounter (Signed)
Pt called back and said the pharmacy fixed the Rx and its only going to cost her $46 now which she is ok with.

## 2022-03-10 NOTE — Telephone Encounter (Signed)
Did she use the coupon I recommended from their website.  Should bring it down to $48

## 2022-03-10 NOTE — Telephone Encounter (Signed)
Patient aware PA approved.

## 2022-03-10 NOTE — Telephone Encounter (Signed)
Patient Advocate Encounter  Prior Authorization for Zepbound 2.'5mg'$ /0.47m has been approved.    PA# PA Case ID #: [id number] Effective dates: 03/07/22 through 09/03/22

## 2022-03-12 ENCOUNTER — Other Ambulatory Visit: Payer: Self-pay | Admitting: Family Medicine

## 2022-03-12 ENCOUNTER — Encounter: Payer: Self-pay | Admitting: Family Medicine

## 2022-03-12 MED ORDER — ONDANSETRON 4 MG PO TBDP: 4.0000 mg | ORAL_TABLET | Freq: Three times a day (TID) | ORAL | 0 refills | Status: DC | PRN

## 2022-03-14 ENCOUNTER — Other Ambulatory Visit (HOSPITAL_COMMUNITY): Payer: Self-pay

## 2022-03-17 ENCOUNTER — Other Ambulatory Visit (HOSPITAL_COMMUNITY): Payer: Self-pay

## 2022-03-17 DIAGNOSIS — Z1389 Encounter for screening for other disorder: Secondary | ICD-10-CM | POA: Diagnosis not present

## 2022-03-24 ENCOUNTER — Other Ambulatory Visit (HOSPITAL_COMMUNITY): Payer: Self-pay

## 2022-03-24 ENCOUNTER — Other Ambulatory Visit: Payer: Self-pay

## 2022-03-26 ENCOUNTER — Other Ambulatory Visit (HOSPITAL_COMMUNITY): Payer: Self-pay

## 2022-03-31 DIAGNOSIS — Z30430 Encounter for insertion of intrauterine contraceptive device: Secondary | ICD-10-CM | POA: Diagnosis not present

## 2022-03-31 DIAGNOSIS — Z3202 Encounter for pregnancy test, result negative: Secondary | ICD-10-CM | POA: Diagnosis not present

## 2022-04-07 ENCOUNTER — Encounter (INDEPENDENT_AMBULATORY_CARE_PROVIDER_SITE_OTHER): Payer: Commercial Managed Care - PPO | Admitting: Ophthalmology

## 2022-04-07 DIAGNOSIS — H35033 Hypertensive retinopathy, bilateral: Secondary | ICD-10-CM

## 2022-04-07 DIAGNOSIS — I10 Essential (primary) hypertension: Secondary | ICD-10-CM

## 2022-04-07 DIAGNOSIS — H43813 Vitreous degeneration, bilateral: Secondary | ICD-10-CM | POA: Diagnosis not present

## 2022-04-14 DIAGNOSIS — R030 Elevated blood-pressure reading, without diagnosis of hypertension: Secondary | ICD-10-CM | POA: Diagnosis not present

## 2022-04-14 DIAGNOSIS — N939 Abnormal uterine and vaginal bleeding, unspecified: Secondary | ICD-10-CM | POA: Diagnosis not present

## 2022-04-14 DIAGNOSIS — N898 Other specified noninflammatory disorders of vagina: Secondary | ICD-10-CM | POA: Diagnosis not present

## 2022-04-23 DIAGNOSIS — Z30431 Encounter for routine checking of intrauterine contraceptive device: Secondary | ICD-10-CM | POA: Diagnosis not present

## 2022-04-23 DIAGNOSIS — N939 Abnormal uterine and vaginal bleeding, unspecified: Secondary | ICD-10-CM | POA: Diagnosis not present

## 2022-04-28 ENCOUNTER — Encounter: Payer: Self-pay | Admitting: Family Medicine

## 2022-04-28 ENCOUNTER — Other Ambulatory Visit (HOSPITAL_COMMUNITY): Payer: Self-pay

## 2022-05-20 ENCOUNTER — Other Ambulatory Visit (HOSPITAL_COMMUNITY): Payer: Self-pay

## 2022-05-20 ENCOUNTER — Encounter: Payer: Self-pay | Admitting: Family Medicine

## 2022-05-20 ENCOUNTER — Ambulatory Visit: Payer: Commercial Managed Care - PPO | Admitting: Family Medicine

## 2022-05-20 DIAGNOSIS — E781 Pure hyperglyceridemia: Secondary | ICD-10-CM

## 2022-05-20 DIAGNOSIS — E8881 Metabolic syndrome: Secondary | ICD-10-CM | POA: Diagnosis not present

## 2022-05-20 DIAGNOSIS — Z713 Dietary counseling and surveillance: Secondary | ICD-10-CM | POA: Diagnosis not present

## 2022-05-20 DIAGNOSIS — N62 Hypertrophy of breast: Secondary | ICD-10-CM

## 2022-05-20 DIAGNOSIS — R7301 Impaired fasting glucose: Secondary | ICD-10-CM

## 2022-05-20 MED ORDER — QSYMIA 3.75-23 MG PO CP24
ORAL_CAPSULE | ORAL | 0 refills | Status: DC
  Filled 2022-05-20: qty 30, 30d supply, fill #0

## 2022-05-20 NOTE — Progress Notes (Signed)
Subjective: CC: Follow-up obesity PCP: [name], Raven Linehan M, DO ZOX:[id number]:Laporsche L Raven [name] is a 32 y.o. female presenting to clinic today for:  1.  Morbid obesity associated with metabolic syndrome which includes hyper triglyceridemia and impaired fasting glucose Patient was prescribed Zepbound in January.  She was able to escalate to the very first dose of 7.5 mg injection before her company decided they were not going to continue covering the medication.  She has since abated the medication but does note that she had at least a 7 pound weight loss on the initial doses.  She is here to discuss alternative therapy as she continues to struggle with weight.  She also reports macromastia that is causing quite a bit of upper back pain, shoulder indentation and even sometimes difficulty laying on her back to sleep.  She often has to lay on her side to breathe comfortably.  She would like referral to specialty medicine in efforts to discuss possible reduction.  She is currently wearing a 42 G size bra.  She has history of BTL and has no plans for further breast-feeding and/or children.     ROS: Per HPI  Allergies  Allergen Reactions   Macrobid [Nitrofurantoin] Hives   Other     Walnuts - burns tongue    Hydroxychloroquine Rash and Hives   Sulfamethoxazole-Trimethoprim Rash   Past Medical History:  Diagnosis Date   Autoimmune disease 07/25/2020   Complication of anesthesia    Elevated liver enzymes    Endometriosis    Medical history non-contributory    Otosclerosis    PONV (postoperative nausea and vomiting)    SVT (supraventricular tachycardia)    Tachycardia     Current Outpatient Medications:    labetalol (NORMODYNE) 200 MG tablet, Take 1 tablet (200 mg total) by mouth 3 (three) times daily., Disp: 270 tablet, Rfl: 0   levothyroxine (SYNTHROID) 50 MCG tablet, Take 1 tablet (50 mcg total) by mouth daily before breakfast., Disp: 90 tablet, Rfl: 3   ondansetron (ZOFRAN-ODT) 4 MG  disintegrating tablet, Take 1 tablet (4 mg total) by mouth every 8 (eight) hours as needed for nausea or vomiting., Disp: 20 tablet, Rfl: 0   sertraline (ZOLOFT) 50 MG tablet, Take 1 tablet (50 mg total) by mouth daily. (Patient taking differently: Take 50 mg by mouth at bedtime.), Disp: 90 tablet, Rfl: 1   tirzepatide (ZEPBOUND) 10 MG/0.5ML Pen, Inject 10 mg into the skin once a week. (Patient not taking: Reported on 05/20/2022), Disp: 2 mL, Rfl: 0   tirzepatide (ZEPBOUND) 2.5 MG/0.5ML Pen, Inject 2.5 mg into the skin once a week. (Patient not taking: Reported on 05/20/2022), Disp: 2 mL, Rfl: 0   tirzepatide (ZEPBOUND) 5 MG/0.5ML Pen, Inject 5 mg into the skin once a week. (Patient not taking: Reported on 05/20/2022), Disp: 2 mL, Rfl: 0   tirzepatide (ZEPBOUND) 7.5 MG/0.5ML Pen, Inject 7.5 mg into the skin once a week. (Patient not taking: Reported on 05/20/2022), Disp: 2 mL, Rfl: 0 Social History   Socioeconomic History   Marital status: Married    Spouse name: Not on file   Number of children: Not on file   Years of education: Not on file   Highest education level: Bachelor's degree (e.g., BA, AB, BS)  Occupational History   Not on file  Tobacco Use   Smoking status: Never   Smokeless tobacco: Never  Vaping Use   Vaping Use: Never used  Substance and Sexual Activity   Alcohol use: No  Drug use: No   Sexual activity: Not Currently  Other Topics Concern   Not on file  Social History Narrative   Not on file   Social Determinants of Health   Financial Resource Strain: Low Risk  (05/19/2022)   Overall Financial Resource Strain (CARDIA)    Difficulty of Paying Living Expenses: Not hard at all  Food Insecurity: No Food Insecurity (05/19/2022)   Hunger Vital Sign    Worried About Running Out of Food in the Last Year: Never true    Ran Out of Food in the Last Year: Never true  Transportation Needs: No Transportation Needs (05/19/2022)   PRAPARE - Administrator, Civil Service  (Medical): No    Lack of Transportation (Non-Medical): No  Physical Activity: Sufficiently Active (05/19/2022)   Exercise Vital Sign    Days of Exercise per Week: 5 days    Minutes of Exercise per Session: 30 min  Stress: No Stress Concern Present (05/19/2022)   [employer] of Occupational Health - Occupational Stress Questionnaire    Feeling of Stress : Not at all  Social Connections: Socially Integrated (05/19/2022)   Social Connection and Isolation Panel [NHANES]    Frequency of Communication with Friends and Family: More than three times a week    Frequency of Social Gatherings with Friends and Family: Once a week    Attends Religious Services: More than 4 times per year    Active Member of [employer] or Organizations: Yes    Attends Engineer, structural: More than 4 times per year    Marital Status: Married  Catering manager Violence: Not At Risk (02/12/2022)   Humiliation, Afraid, Rape, and Kick questionnaire    Fear of Current or Ex-Partner: No    Emotionally Abused: No    Physically Abused: No    Sexually Abused: No   Family History  Problem Relation Age of Onset   Other Mother        Elevated liver enzymes   Diabetes Mother    Hypertension Mother    Thyroid disease Mother    Diabetes Father    Hypertension Father    Hyperlipidemia Father    Other Brother        Elevated liver enzymes   Asthma Paternal Aunt    Asthma Paternal Grandmother    Healthy Son    Allergic rhinitis Neg Hx    Eczema Neg Hx    Urticaria Neg Hx     Objective: Office vital signs reviewed. BP 120/85   Pulse 93   Temp 98.3 F (36.8 C)   Ht 5\' 4"  (1.626 [name])   Wt 233 lb (105.7 kg)   SpO2 98%   BMI 39.99 kg/[name]   Physical Examination:  General: Awake, alert, well-appearing, morbidly obese female, No acute distress HEENT: sclera white, MMM Cardio: regular rate and rhythm, S1S2 heard, no murmurs appreciated Pulm: clear to auscultation bilaterally, no wheezes, rhonchi or rales; normal  work of breathing on room air Shoulders: Bra strap indentations appreciated.  Slight internal rotation of the shoulders appreciated with mild increased kyphosis of the thoracic spine noted Breast: Macromastia present.  Breasts are pendulous  Assessment/ Plan: 32 y.o. female   Morbid obesity - Plan: Phentermine-Topiramate (QSYMIA) 3.75-23 MG CP24  Encounter for weight loss counseling - Plan: Phentermine-Topiramate (QSYMIA) 3.75-23 MG CP24  Metabolic syndrome - Plan: Phentermine-Topiramate (QSYMIA) 3.75-23 MG CP24  Impaired fasting glucose - Plan: Phentermine-Topiramate (QSYMIA) 3.75-23 MG CP24  Hypertriglyceridemia - Plan: Phentermine-Topiramate (QSYMIA)  3.75-23 MG CP24  Macromastia - Plan: Ambulatory referral to Plastic Surgery, Phentermine-Topiramate (QSYMIA) 3.75-23 MG CP24  She was having an excellent response to the GIP but unfortunately due to company changes and discontinuation of coverage of the GIP, we discussed risks versus benefits of both of the oral medications that are available for prior authorization through [facility] health which include Qsymia and Contrave.  We discussed the limitations of both of these medications.  She of course is to continue lifestyle modification to promote healthy weight and wellness.  The national narcotic database was reviewed and there were no red flags.  Will plan for UDS and CSA at next visit if she desires continuation of this medication.  Hydrate well.  I do worry about her macromastia causing upper thoracic issues long-term.  She sounds like she is always having an obesity hypoventilation syndrome due to the weight of her breasts.  She certainly seems like a good surgical candidate for breast reduction so I am going to place a referral to Dr. Kittie Plater office for further discussion and evaluation of appropriateness for this patient.  No orders of the defined types were placed in this encounter.  No orders of the defined types were placed in this  encounter.    Raliegh Ip, DO [facility] [phone]

## 2022-05-23 ENCOUNTER — Encounter: Payer: Self-pay | Admitting: Family Medicine

## 2022-05-23 ENCOUNTER — Other Ambulatory Visit (HOSPITAL_COMMUNITY): Payer: Self-pay

## 2022-05-28 ENCOUNTER — Other Ambulatory Visit (HOSPITAL_COMMUNITY): Payer: Self-pay

## 2022-05-28 ENCOUNTER — Telehealth: Payer: Self-pay

## 2022-05-28 NOTE — Telephone Encounter (Signed)
Pharmacy Patient Advocate Encounter   Received notification that prior authorization for Qsymia 3.75-23MG  er capsules is required/requested.  Per Test Claim: PA required   PA submitted on 05/28/22 to (ins) [employer] via CoverMyMeds Key or [facility]) confirmation # [id number] Status is pending

## 2022-05-31 NOTE — Telephone Encounter (Signed)
PA Approved- Quantity limit:

## 2022-06-02 ENCOUNTER — Other Ambulatory Visit (HOSPITAL_COMMUNITY): Payer: Self-pay

## 2022-06-05 ENCOUNTER — Other Ambulatory Visit (HOSPITAL_COMMUNITY): Payer: Self-pay

## 2022-06-06 ENCOUNTER — Other Ambulatory Visit (HOSPITAL_COMMUNITY): Payer: Self-pay

## 2022-06-09 ENCOUNTER — Other Ambulatory Visit (HOSPITAL_COMMUNITY): Payer: Self-pay

## 2022-06-09 ENCOUNTER — Other Ambulatory Visit: Payer: Self-pay | Admitting: Family Medicine

## 2022-06-09 DIAGNOSIS — E8881 Metabolic syndrome: Secondary | ICD-10-CM

## 2022-06-09 DIAGNOSIS — N62 Hypertrophy of breast: Secondary | ICD-10-CM

## 2022-06-09 DIAGNOSIS — R7301 Impaired fasting glucose: Secondary | ICD-10-CM

## 2022-06-09 DIAGNOSIS — E781 Pure hyperglyceridemia: Secondary | ICD-10-CM

## 2022-06-09 MED ORDER — QSYMIA 7.5-46 MG PO CP24
1.0000 | ORAL_CAPSULE | Freq: Every day | ORAL | 0 refills | Status: DC
  Filled 2022-06-09: qty 30, 30d supply, fill #0

## 2022-06-12 ENCOUNTER — Other Ambulatory Visit: Payer: Self-pay

## 2022-06-12 ENCOUNTER — Other Ambulatory Visit (HOSPITAL_COMMUNITY): Payer: Self-pay

## 2022-06-20 DIAGNOSIS — I8392 Asymptomatic varicose veins of left lower extremity: Secondary | ICD-10-CM | POA: Diagnosis not present

## 2022-06-20 DIAGNOSIS — Z30433 Encounter for removal and reinsertion of intrauterine contraceptive device: Secondary | ICD-10-CM | POA: Diagnosis not present

## 2022-06-27 MED ORDER — PHENTERMINE HCL 15 MG PO CAPS: 15.0000 mg | ORAL_CAPSULE | ORAL | 1 refills | Status: DC

## 2022-07-09 ENCOUNTER — Ambulatory Visit: Payer: Commercial Managed Care - PPO | Admitting: Plastic Surgery

## 2022-07-09 ENCOUNTER — Encounter: Payer: Self-pay | Admitting: Plastic Surgery

## 2022-07-09 DIAGNOSIS — R21 Rash and other nonspecific skin eruption: Secondary | ICD-10-CM

## 2022-07-09 DIAGNOSIS — M545 Low back pain, unspecified: Secondary | ICD-10-CM | POA: Diagnosis not present

## 2022-07-09 DIAGNOSIS — Z6841 Body Mass Index (BMI) 40.0 and over, adult: Secondary | ICD-10-CM | POA: Diagnosis not present

## 2022-07-09 DIAGNOSIS — N644 Mastodynia: Secondary | ICD-10-CM

## 2022-07-09 DIAGNOSIS — M546 Pain in thoracic spine: Secondary | ICD-10-CM | POA: Diagnosis not present

## 2022-07-09 DIAGNOSIS — N62 Hypertrophy of breast: Secondary | ICD-10-CM | POA: Diagnosis not present

## 2022-07-09 DIAGNOSIS — M542 Cervicalgia: Secondary | ICD-10-CM

## 2022-07-09 DIAGNOSIS — R768 Other specified abnormal immunological findings in serum: Secondary | ICD-10-CM

## 2022-07-09 DIAGNOSIS — G8929 Other chronic pain: Secondary | ICD-10-CM

## 2022-07-09 DIAGNOSIS — M549 Dorsalgia, unspecified: Secondary | ICD-10-CM | POA: Insufficient documentation

## 2022-07-09 NOTE — Addendum Note (Signed)
Addended by: Verdie Shire on: 07/09/2022 01:21 PM   Modules accepted: Orders

## 2022-07-09 NOTE — Progress Notes (Signed)
Patient ID: Raven [name], female    DOB: Jun 29, 1990, 32 y.o.   MRN: [id number]   Chief Complaint  Patient presents with   Advice Only   Breast Problem    Mammary Hyperplasia: The patient is a 32 y.o. female with a history of mammary hyperplasia for several years.  She has extremely large breasts causing symptoms that include the following: Back pain in the upper and lower back, including neck pain. She pulls or pins her bra straps to provide better lift and relief of the pressure and pain. She notices relief by holding her breast up manually.  Her shoulder straps cause grooves and pain and pressure that requires padding for relief. Pain medication is sometimes required with motrin and tylenol.  Activities that are hindered by enlarged breasts include: exercise and running.  She has tried supportive clothing as well as fitted bras without improvement.  Her breasts are extremely large and fairly symmetric.  She has hyperpigmentation of the inframammary area on both sides.  She is 5 feet 4 inches tall and weighs 239 pounds.  The BMI = 41 kg/m.  Preoperative bra size = G cup.  She would like to be as small as possible. The estimated excess breast tissue required to be removed at the time of surgery = 1000 grams on the left and 1000 grams on the right.  Mammogram history: 2021 benign cyst.  Family history of breast cancer: None.  Tobacco use: None.   The patient expresses the desire to pursue surgical intervention.  The patient has 2 kids with the last 1 being 69 months old.  She had a tubal ligation so no plans for further children.  She has a history of supraventricular tachycardia and endometriosis.     Review of Systems  Constitutional:  Positive for activity change. Negative for appetite change.  Eyes: Negative.   Respiratory: Negative.  Negative for chest tightness and shortness of breath.   Cardiovascular: Negative.   Gastrointestinal: Negative.   Endocrine: Negative.    Genitourinary: Negative.   Musculoskeletal:  Positive for back pain and neck pain.  Skin:  Positive for rash.  Hematological: Negative.     Past Medical History:  Diagnosis Date   Autoimmune disease (HCC) 07/25/2020   Complication of anesthesia    Elevated liver enzymes    Endometriosis    Medical history non-contributory    Otosclerosis    PONV (postoperative nausea and vomiting)    SVT (supraventricular tachycardia)    Tachycardia     Past Surgical History:  Procedure Laterality Date   CESAREAN SECTION N/A 06/14/2014   Procedure: CESAREAN SECTION;  Surgeon: Carrington Clamp, MD;  Location: [facility];  Service: Obstetrics;  Laterality: N/A;   CESAREAN SECTION WITH BILATERAL TUBAL LIGATION Bilateral 01/30/2022   Procedure: REPEAT CESAREAN SECTION WITH BILATERAL TUBAL LIGATION EDC: 02-05-22 ALLERG: HYDROXYCHLOROQUINE, SULFA, WALNUT  PREVIOUS X 1;  Surgeon: Mitchel Honour, DO;  Location: [facility];  Service: Obstetrics;  Laterality: Bilateral;   EXCISION MASS ABDOMINAL N/A 10/21/2019   Procedure: EXCISION MASS ABDOMINAL;  Surgeon: Berna Bue, MD;  Location: [facility] ORS;  Service: General;  Laterality: N/A;   stebactomy        Current Outpatient Medications:    levothyroxine (SYNTHROID) 50 MCG tablet, Take 1 tablet (50 mcg total) by mouth daily before breakfast., Disp: 90 tablet, Rfl: 3   phentermine 15 MG capsule, Take 1 capsule (15 mg total) by mouth every morning., Disp: 30 capsule, Rfl:  1   sertraline (ZOLOFT) 50 MG tablet, Take 1 tablet (50 mg total) by mouth daily. (Patient taking differently: Take 50 mg by mouth at bedtime.), Disp: 90 tablet, Rfl: 1   labetalol (NORMODYNE) 200 MG tablet, Take 1 tablet (200 mg total) by mouth 3 (three) times daily. (Patient not taking: Reported on 07/09/2022), Disp: 270 tablet, Rfl: 0   ondansetron (ZOFRAN-ODT) 4 MG disintegrating tablet, Take 1 tablet (4 mg total) by mouth every 8 (eight) hours as needed for nausea or vomiting. (Patient not taking:  Reported on 07/09/2022), Disp: 20 tablet, Rfl: 0   Objective:   Vitals:   07/09/22 1137  BP: (!) 128/93  Pulse: 87    Physical Exam Vitals and nursing note reviewed.  Constitutional:      Appearance: Normal appearance.  HENT:     Head: Normocephalic and atraumatic.  Cardiovascular:     Rate and Rhythm: Normal rate.     Pulses: Normal pulses.  Pulmonary:     Effort: Pulmonary effort is normal.  Abdominal:     General: There is no distension.     Palpations: Abdomen is soft. There is no mass.     Tenderness: There is no abdominal tenderness.  Skin:    General: Skin is warm.     Capillary Refill: Capillary refill takes less than 2 seconds.     Coloration: Skin is not jaundiced.     Findings: No bruising or lesion.  Neurological:     Mental Status: She is alert and oriented to person, place, and time.  Psychiatric:        Mood and Affect: Mood normal.        Behavior: Behavior normal.        Thought Content: Thought content normal.        Judgment: Judgment normal.     Assessment & Plan:  Morbid obesity (HCC)  Positive ANA (antinuclear antibody)  Pain of breast  Rash and other nonspecific skin eruption  Symptomatic mammary hypertrophy  Chronic bilateral thoracic back pain  The procedure the patient selected and that was best for the patient was discussed. The risk were discussed and include but not limited to the following:  Breast asymmetry, fluid accumulation, firmness of the breast, inability to breast feed, loss of nipple or areola, skin loss, change in skin and nipple sensation, fat necrosis of the breast tissue, bleeding, infection and healing delay.  There are risks of anesthesia and injury to nerves or blood vessels.  Allergic reaction to tape, suture and skin glue are possible.  There will be swelling.  Any of these can lead to the need for revisional surgery which is not included in this surgery.  A breast reduction has potential to interfere with diagnostic  procedures in the future.  This procedure is best done when the breast is fully developed.  Changes in the breast will continue to occur over time: pregnancy, weight gain or weigh loss. No guarantees are given for a certain bra or breast size.    Total time: 40 minutes. This includes time spent with the patient during the visit as well as time spent before and after the visit reviewing the chart, documenting the encounter, ordering pertinent studies and literature for the patient.   Physical therapy:  ordered Mammogram:  not indicated Healthy Weight and Wellness:  consult placed  Patient is a really good candidate for bilateral breast reduction but she will need to lose a little more weight.  She is on  the phentermine and is actively trying to decrease her weight.  Will have her come and see [place] when she finishes physical therapy for further evaluation.  I am not sure I could get off the 1000 g so I am hoping she can decrease her weight by 10 to 15 pounds.  Pictures were obtained of the patient and placed in the chart with the patient's or guardian's permission.   Alena Bills Prosperity Darrough, DO

## 2022-07-10 ENCOUNTER — Other Ambulatory Visit (HOSPITAL_COMMUNITY): Payer: Self-pay

## 2022-07-10 ENCOUNTER — Encounter: Payer: Self-pay | Admitting: Family

## 2022-07-10 ENCOUNTER — Telehealth: Payer: Commercial Managed Care - PPO | Admitting: Family

## 2022-07-10 DIAGNOSIS — L255 Unspecified contact dermatitis due to plants, except food: Secondary | ICD-10-CM | POA: Diagnosis not present

## 2022-07-10 MED ORDER — TRIAMCINOLONE ACETONIDE 0.5 % EX OINT
1.0000 | TOPICAL_OINTMENT | Freq: Two times a day (BID) | CUTANEOUS | 0 refills | Status: DC
Start: 2022-07-10 — End: 2022-08-27
  Filled 2022-07-10: qty 30, 15d supply, fill #0

## 2022-07-10 MED ORDER — PREDNISONE 10 MG (21) PO TBPK
ORAL_TABLET | ORAL | 0 refills | Status: DC
Start: 2022-07-10 — End: 2022-07-21
  Filled 2022-07-10: qty 21, 6d supply, fill #0

## 2022-07-10 NOTE — Progress Notes (Signed)
Virtual Visit Consent   Raven [name], you are scheduled for a virtual visit with a [facility] provider today. Just as with appointments in the office, your consent must be obtained to participate. Your consent will be active for this visit and any virtual visit you may have with one of our providers in the next 365 days. If you have a MyChart account, a copy of this consent can be sent to you electronically.  As this is a virtual visit, video technology does not allow for your provider to perform a traditional examination. This may limit your provider's ability to fully assess your condition. If your provider identifies any concerns that need to be evaluated in person or the need to arrange testing (such as labs, EKG, etc.), we will make arrangements to do so. Although advances in technology are sophisticated, we cannot ensure that it will always work on either your end or our end. If the connection with a video visit is poor, the visit may have to be switched to a telephone visit. With either a video or telephone visit, we are not always able to ensure that we have a secure connection.  By engaging in this virtual visit, you consent to the provision of healthcare and authorize for your insurance to be billed (if applicable) for the services provided during this visit. Depending on your insurance coverage, you may receive a charge related to this service.  I need to obtain your verbal consent now. Are you willing to proceed with your visit today? TICHELLE SIMINO has provided verbal consent on 07/10/2022 for a virtual visit (video or telephone). Jannifer Rodney, FNP  Date: 07/10/2022 12:08 PM  Virtual Visit via Video Note   I, Jannifer Rodney, connected with  Raven [name]  ([id number], [birth year]) on 07/10/22 at  5:00 PM EDT by a video-enabled telemedicine application and verified that I am speaking with the correct person using two identifiers.  Location: Patient: Virtual Visit Location  Patient: Other: work Provider: Pharmacist, community: Office/Clinic   I discussed the limitations of evaluation and management by telemedicine and the availability of in person appointments. The patient expressed understanding and agreed to proceed.    History of Present Illness: Raven [name] is a 32 y.o. who identifies as a female who was assigned female at birth, and is being seen today for rash on bilateral arms and legs that started Monday. She reports it looks likes HIVES and has been taking benadryl and used steroid cream without relief.   HPI: Rash This is a new problem. The current episode started in the past 7 days. The problem has been gradually worsening since onset. The affected locations include the left arm, right arm, right upper leg, right lower leg, left upper leg and left lower leg. The rash is characterized by itchiness and redness. She was exposed to plant contact.    Problems:  Patient Active Problem List   Diagnosis Date Noted   Symptomatic mammary hypertrophy 07/09/2022   Back pain 07/09/2022   Preeclampsia in postpartum period 02/11/2022   Pre-eclampsia in postpartum period 02/11/2022   Previous cesarean section 01/30/2022   S/P cesarean section 01/30/2022   Morbid obesity (HCC) 09/28/2020   Rash and other nonspecific skin eruption 09/10/2020   Positive ANA (antinuclear antibody) 07/25/2020   High risk medication use 07/25/2020   Autoimmune disease (HCC) 07/25/2020   Pain of right thumb 06/12/2020   Adverse reaction to food, subsequent encounter 05/11/2020   Pain of  breast 12/02/2019   Abdominal mass 07/22/2019   Multiple joint pain 03/22/2019   Bilateral hearing loss 11/01/2018   Well adult exam 11/01/2018   Endometriosis 10/12/2018   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   Otosclerosis 09/03/2018   Cesarean delivery delivered 06/15/2014    Allergies:  Allergies   Allergen Reactions   Macrobid [Nitrofurantoin] Hives   Other     Walnuts - burns tongue    Hydroxychloroquine Rash and Hives   Sulfamethoxazole-Trimethoprim Rash   Medications:  Current Outpatient Medications:    predniSONE (STERAPRED UNI-PAK 21 TAB) 10 MG (21) TBPK tablet, Use as directed, Disp: 21 tablet, Rfl: 0   triamcinolone ointment (KENALOG) 0.5 %, Apply 1 Application topically 2 (two) times daily., Disp: 30 g, Rfl: 0   labetalol (NORMODYNE) 200 MG tablet, Take 1 tablet (200 mg total) by mouth 3 (three) times daily. (Patient not taking: Reported on 07/09/2022), Disp: 270 tablet, Rfl: 0   levothyroxine (SYNTHROID) 50 MCG tablet, Take 1 tablet (50 mcg total) by mouth daily before breakfast., Disp: 90 tablet, Rfl: 3   phentermine 15 MG capsule, Take 1 capsule (15 mg total) by mouth every morning., Disp: 30 capsule, Rfl: 1   sertraline (ZOLOFT) 50 MG tablet, Take 1 tablet (50 mg total) by mouth daily. (Patient taking differently: Take 50 mg by mouth at bedtime.), Disp: 90 tablet, Rfl: 1  Observations/Objective: Patient is well-developed, well-nourished in no acute distress.  Resting comfortably   Head is normocephalic, atraumatic.  No labored breathing.  Speech is clear and coherent with logical content.  Patient is alert and oriented at baseline.  Erythemas papule rash on lower legs and arms  Assessment and Plan: 1. Contact dermatitis due to plant - predniSONE (STERAPRED UNI-PAK 21 TAB) 10 MG (21) TBPK tablet; Use as directed  Dispense: 21 tablet; Refill: 0 - triamcinolone ointment (KENALOG) 0.5 %; Apply 1 Application topically 2 (two) times daily.  Dispense: 30 g; Refill: 0  -Avoid scratching Keep clean and dry -Wear protective clothing while outside- Long sleeves and long pants -Put insect repellent on all exposed skin and along clothing -Take a shower as soon as possible after being outside -Follow up if symptoms worsen or do not improve   Follow Up Instructions: I  discussed the assessment and treatment plan with the patient. The patient was provided an opportunity to ask questions and all were answered. The patient agreed with the plan and demonstrated an understanding of the instructions.  A copy of instructions were sent to the patient via MyChart unless otherwise noted below.     The patient was advised to call back or seek an in-person evaluation if the symptoms worsen or if the condition fails to improve as anticipated.  Time:  I spent 8 minutes with the patient via telehealth technology discussing the above problems/concerns.    Jannifer Rodney, FNP

## 2022-07-18 DIAGNOSIS — I83892 Varicose veins of left lower extremities with other complications: Secondary | ICD-10-CM | POA: Diagnosis not present

## 2022-07-18 DIAGNOSIS — M79662 Pain in left lower leg: Secondary | ICD-10-CM | POA: Diagnosis not present

## 2022-07-18 DIAGNOSIS — R252 Cramp and spasm: Secondary | ICD-10-CM | POA: Diagnosis not present

## 2022-07-18 DIAGNOSIS — I87393 Chronic venous hypertension (idiopathic) with other complications of bilateral lower extremity: Secondary | ICD-10-CM | POA: Diagnosis not present

## 2022-07-18 DIAGNOSIS — R600 Localized edema: Secondary | ICD-10-CM | POA: Diagnosis not present

## 2022-07-19 ENCOUNTER — Encounter (INDEPENDENT_AMBULATORY_CARE_PROVIDER_SITE_OTHER): Payer: Commercial Managed Care - PPO | Admitting: Family Medicine

## 2022-07-19 ENCOUNTER — Other Ambulatory Visit (HOSPITAL_COMMUNITY): Payer: Self-pay

## 2022-07-19 DIAGNOSIS — E8881 Metabolic syndrome: Secondary | ICD-10-CM

## 2022-07-21 ENCOUNTER — Other Ambulatory Visit (HOSPITAL_COMMUNITY): Payer: Self-pay

## 2022-07-21 ENCOUNTER — Other Ambulatory Visit: Payer: Self-pay | Admitting: Family Medicine

## 2022-07-21 MED ORDER — PHENTERMINE HCL 37.5 MG PO TABS
37.5000 mg | ORAL_TABLET | Freq: Every day | ORAL | 1 refills | Status: DC
Start: 2022-07-21 — End: 2022-08-28

## 2022-07-21 MED ORDER — SERTRALINE HCL 50 MG PO TABS
50.0000 mg | ORAL_TABLET | Freq: Every day | ORAL | 0 refills | Status: DC
  Filled 2022-07-21: qty 90, 90d supply, fill #0

## 2022-07-21 NOTE — Telephone Encounter (Signed)
Please see the MyChart message reply(ies) for my assessment and plan.    This patient gave consent for this Medical Advice Message and is aware that it may result in a bill to their insurance company, as well as the possibility of receiving a bill for a co-payment or deductible. They are an established patient, but are not seeking medical advice exclusively about a problem treated during an in person or video visit in the last seven days. I did not recommend an in person or video visit within seven days of my reply.    I spent a total of 6 minutes cumulative time within 7 days through MyChart messaging.  Jannell Franta, DO   

## 2022-07-25 ENCOUNTER — Other Ambulatory Visit (HOSPITAL_COMMUNITY): Payer: Self-pay

## 2022-07-25 ENCOUNTER — Encounter: Payer: Self-pay | Admitting: Family Medicine

## 2022-07-25 MED ORDER — SERTRALINE HCL 50 MG PO TABS: 50.0000 mg | ORAL_TABLET | Freq: Every day | ORAL | 0 refills | Status: DC

## 2022-08-05 ENCOUNTER — Institutional Professional Consult (permissible substitution): Payer: Commercial Managed Care - PPO | Admitting: Plastic Surgery

## 2022-08-18 DIAGNOSIS — N921 Excessive and frequent menstruation with irregular cycle: Secondary | ICD-10-CM | POA: Diagnosis not present

## 2022-08-18 DIAGNOSIS — Z30431 Encounter for routine checking of intrauterine contraceptive device: Secondary | ICD-10-CM | POA: Diagnosis not present

## 2022-08-19 ENCOUNTER — Encounter: Payer: Self-pay | Admitting: Family Medicine

## 2022-08-20 ENCOUNTER — Encounter: Payer: Self-pay | Admitting: Internal Medicine

## 2022-08-20 ENCOUNTER — Ambulatory Visit: Payer: Commercial Managed Care - PPO | Attending: Internal Medicine | Admitting: Internal Medicine

## 2022-08-20 VITALS — BP 118/84 | HR 108 | Resp 16 | Ht 64.0 in | Wt 233.0 lb

## 2022-08-20 DIAGNOSIS — M7651 Patellar tendinitis, right knee: Secondary | ICD-10-CM | POA: Diagnosis not present

## 2022-08-20 DIAGNOSIS — M720 Palmar fascial fibromatosis [Dupuytren]: Secondary | ICD-10-CM | POA: Diagnosis not present

## 2022-08-20 DIAGNOSIS — M79672 Pain in left foot: Secondary | ICD-10-CM | POA: Diagnosis not present

## 2022-08-20 NOTE — Patient Instructions (Signed)
I am not certain the exact cause of your toe pain but looks pretty benign. You could try using an insert to offload the affected area like a pad for metatarsalgia. Otherwise conservative treatment with as needed NSAIDs or local heat for pain relief.  I also added some exercises for the knee pain, but only do these if they are not causing pain.  If symptoms are not improving could probably benefit to see a ortho/sports medicine specialist.

## 2022-08-20 NOTE — Progress Notes (Signed)
Office Visit Note  Patient: Raven [name]             Date of Birth: January 08, 1991           MRN: [id number]             PCP: Raliegh Ip, DO Referring: Raliegh Ip, DO Visit Date: 08/20/2022   Subjective:  Follow-up (Patient states she has some joint pain in her right knee and left big toe and foot area. )   History of Present Illness: Raven [name] is a 32 y.o. female here for follow up due to increased joint pain the past few months especially around right knee and her left foot and great toe.  She was generally doing pretty well since last visit without major change in symptoms but had a successful pregnancy last year with C-section delivery in December.  This went well but was complicated by preeclampsia in the postpartum period.  Since then has been noticing more increased symptoms gets pain in the right knee mostly in the front and this is often provoked with movement.  Also getting some pain on the left foot especially around the great toe and this problem has been getting worse over the past month.  She does not see much visible swelling or other change in the affected area.  Previous HPI 09/24/20 Raven [name] is a 32 y.o. female here for follow-up with persistent right thumb inflammation and pain incompletely controlled by NSAIDs or local injection after trial of starting hydroxychloroquine.  She developed diffuse skin rashes so we discontinued the medicine and treatment with a short steroid taper largely improved the rash and now it is completely gone.  Her joint inflammation is currently improved with no ongoing swelling but still pain with use.  She is also had at least 1 mouth sore and some chronic dryness.   09/10/20 Raven [name] is a 32 y.o. female here for follow up for pain and swelling in her hands especially right thumb after starting hydroxychloroquine 3 weeks ago. She developed a new skin rash this weekend which she never experienced  before. This started overnight she woke up with diffuse rash all over her trunk particularly beneath her breasts. This is not itchy or painful, and she took benadryl this morning after seeing this. She had also noticed some increase in pain and swelling in the left hand 3rd finger since last week.    08/20/20 Raven [name] is a 32 y.o. female here for follow up for positive ANA with skin rashes and finger pain and swelling. We did not start DMARD treatment at initial visit just using NSAIDs with f/u to decide if trial of inflammatory arthritis treatment needed.  Since her last visit she continues to have joint pain that is only slightly controlled with use of oral NSAIDs.  Most problematic is her right thumb MCP joint remains persistently swollen and has difficulty with apposition and strength affecting her work.  She has had pain more intermittently at other sides but right hand symptoms remain the worst.   Previous HPI: 07/25/20 Raven [name] is a 32 y.o. female here for evaluation of right thumb pain and positive ANA. She has been seen previously with [facility] with workup showing positive ANA antibodies but suspected hand inflammation appeared to be self limited at the time improving after a period of conservative treatment with NSAIDs. Currently she has swelling and pain at the right thumb that started acutely in  mid April. She returned from a trip at that time with no recollection of any injury or heavy use preceding these symptoms. She saw her PCP with treatments including a short term steroid taper that helped for a few days, and oral meloxicam that helps her symptoms partially.  Orthopedics evaluation with hand xray reportedly demonstrated no arthritis changes. Currently she still has the persistent swelling in her right 1st MCP joint with some discoloration and no other joints involved. This does feel similar to previously episodes of swollen joints just different  distribution. She does not report any skin rashes or systemic symptoms.   Labs reviewed 04/2019 ANA 1:160 homogenous, nuclear dots RF neg CRP neg HLA-B27 neg AST 58 ALT 53   Review of Systems  Constitutional:  Negative for fatigue.  HENT:  Negative for mouth sores and mouth dryness.   Eyes:  Negative for dryness.  Respiratory:  Negative for shortness of breath.   Cardiovascular:  Negative for chest pain and palpitations.  Gastrointestinal:  Positive for diarrhea. Negative for blood in stool and constipation.  Endocrine: Negative for increased urination.  Genitourinary:  Negative for involuntary urination.  Musculoskeletal:  Positive for joint pain, joint pain and morning stiffness. Negative for gait problem, joint swelling, myalgias, muscle weakness, muscle tenderness and myalgias.  Skin:  Positive for hair loss and sensitivity to sunlight. Negative for color change and rash.  Allergic/Immunologic: Negative for susceptible to infections.  Neurological:  Positive for headaches. Negative for dizziness.  Hematological:  Negative for swollen glands.  Psychiatric/Behavioral:  Negative for depressed mood and sleep disturbance. The patient is nervous/anxious.     PMFS History:  Patient Active Problem List   Diagnosis Date Noted   Dupuytren's contracture of right hand 08/20/2022   Patellar tendonitis of right knee 08/20/2022   Pain in left foot 08/20/2022   Symptomatic mammary hypertrophy 07/09/2022   Back pain 07/09/2022   Preeclampsia in postpartum period 02/11/2022   Pre-eclampsia in postpartum period 02/11/2022   Previous cesarean section 01/30/2022   S/P cesarean section 01/30/2022   Morbid obesity (HCC) 09/28/2020   Rash and other nonspecific skin eruption 09/10/2020   Positive ANA (antinuclear antibody) 07/25/2020   High risk medication use 07/25/2020   Autoimmune disease (HCC) 07/25/2020   Pain of right thumb 06/12/2020   Adverse reaction to food, subsequent encounter  05/11/2020   Pain of breast 12/02/2019   Abdominal mass 07/22/2019   Multiple joint pain 03/22/2019   Bilateral hearing loss 11/01/2018   Well adult exam 11/01/2018   Endometriosis 10/12/2018   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   Otosclerosis 09/03/2018   Cesarean delivery delivered 06/15/2014    Past Medical History:  Diagnosis Date   Autoimmune disease (HCC) 07/25/2020   Complication of anesthesia    Elevated liver enzymes    Endometriosis    Medical history non-contributory    Otosclerosis    PONV (postoperative nausea and vomiting)    SVT (supraventricular tachycardia)    Tachycardia     Family History  Problem Relation Age of Onset   Other Mother        Elevated liver enzymes   Diabetes Mother    Hypertension Mother    Thyroid disease Mother    Diabetes Father    Hypertension Father    Hyperlipidemia Father    Other Brother        Elevated liver enzymes   Asthma Paternal Aunt  Asthma Paternal Grandmother    Healthy Son    Allergic rhinitis Neg Hx    Eczema Neg Hx    Urticaria Neg Hx    Past Surgical History:  Procedure Laterality Date   CESAREAN SECTION N/A 06/14/2014   Procedure: CESAREAN SECTION;  Surgeon: Carrington Clamp, MD;  Location: [facility];  Service: Obstetrics;  Laterality: N/A;   CESAREAN SECTION WITH BILATERAL TUBAL LIGATION Bilateral 01/30/2022   Procedure: REPEAT CESAREAN SECTION WITH BILATERAL TUBAL LIGATION EDC: 02-05-22 ALLERG: HYDROXYCHLOROQUINE, SULFA, WALNUT  PREVIOUS X 1;  Surgeon: Mitchel Honour, DO;  Location: [facility];  Service: Obstetrics;  Laterality: Bilateral;   EXCISION MASS ABDOMINAL N/A 10/21/2019   Procedure: EXCISION MASS ABDOMINAL;  Surgeon: Berna Bue, MD;  Location: [facility];  Service: General;  Laterality: N/A;   stebactomy     Social History   Social History Narrative   Not on file   Immunization History  Administered Date(s) Administered    Influenza,inj,Quad PF,6+ Mos 11/16/2020   Influenza-Unspecified 11/10/2013, 11/10/2016, 11/11/2019   PFIZER(Purple Top)SARS-COV-2 Vaccination 02/24/2019, 03/17/2019   Tdap 05/01/2014     Objective: Vital Signs: BP 118/84 (BP Location: Left Arm, Patient Position: Sitting, Cuff Size: Normal)   Pulse (!) 108   Resp 16   Ht 5\' 4"  (1.626 m)   Wt 233 lb (105.7 kg)   Breastfeeding No   BMI 39.99 kg/m    Physical Exam Constitutional:      Appearance: She is obese.  Cardiovascular:     Rate and Rhythm: Normal rate and regular rhythm.  Pulmonary:     Effort: Pulmonary effort is normal.     Breath sounds: Normal breath sounds.  Musculoskeletal:     Right lower leg: No edema.     Left lower leg: No edema.  Lymphadenopathy:     Cervical: No cervical adenopathy.  Skin:    General: Skin is warm and dry.     Findings: No rash.  Neurological:     Mental Status: She is alert.  Psychiatric:        Mood and Affect: Mood normal.      Musculoskeletal Exam:  Shoulders full ROM no tenderness or swelling Elbows full ROM no tenderness or swelling Wrists full ROM no tenderness or swelling Fingers full ROM no tenderness or swelling, dupuytren contracture on right hand proximal to 4th digit Knees full ROM tenderness to pressure at patellar tendon insertion of right knee Ankles full ROM no tenderness or swelling Tenderness to pressure on plantar side of left 1st MTP, no nodule or swelling   Investigation: No additional findings.  Imaging: No results found.  Recent Labs: Lab Results  Component Value Date   WBC 7.2 02/12/2022   HGB 12.2 02/12/2022   PLT 312 02/12/2022   NA 139 03/06/2022   K 4.4 03/06/2022   CL 104 03/06/2022   CO2 20 03/06/2022   GLUCOSE 87 03/06/2022   BUN 11 03/06/2022   CREATININE 0.88 03/06/2022   BILITOT 0.2 03/06/2022   ALKPHOS 112 03/06/2022   AST 20 03/06/2022   ALT 24 03/06/2022   PROT 6.3 03/06/2022   ALBUMIN 4.0 03/06/2022   CALCIUM 9.1  03/06/2022   GFRAA 116 07/25/2020    Speciality Comments: No specialty comments available.  Procedures:  No procedures performed Allergies: Macrobid [nitrofurantoin], Other, Hydroxychloroquine, and Sulfamethoxazole-trimethoprim   Assessment / Plan:     Visit Diagnoses: Patellar tendonitis of right knee  Right knee pain appears most consistent with patellar tendinitis  or less likely infrapatellar bursitis.  I discussed this is probably more use related then suggestive of underlying inflammatory problem.  Recommend supportive care including nonsteroidal anti-inflammatory medicine as needed, can use topical hot or cold, or can try flexible supporting knee brace.  Provided some patellar tendinitis rehabilitation exercises can work on at home.  If failing to improve we will consider referral to physical therapy versus Ortho/sports med evaluation.  Pain in left foot  Not sure about the mechanism for left foot pain exam looks unremarkable.  I do not think she has significant osteoarthritis.  Wonder if there could be some abnormal compensation going on due to right knee pain recommend trial of insert for metatarsalgia type pain.  Dupuytren's contracture of right hand  Mild Dupuytren's contracture nodule on right hand not causing significant symptoms.  Orders: No orders of the defined types were placed in this encounter.  No orders of the defined types were placed in this encounter.    Follow-Up Instructions: Return if symptoms worsen or fail to improve.   Fuller Plan, MD  Note - This record has been created using [employer].  Chart creation errors have been sought, but may not always  have been located. Such creation errors do not reflect on  the standard of medical care.

## 2022-08-21 ENCOUNTER — Other Ambulatory Visit: Payer: Commercial Managed Care - PPO

## 2022-08-21 DIAGNOSIS — E038 Other specified hypothyroidism: Secondary | ICD-10-CM

## 2022-08-22 LAB — T4, FREE: Free T4: 1.19 ng/dL (ref 0.82–1.77)

## 2022-08-22 LAB — TSH: TSH: 0.605 u[IU]/mL (ref 0.450–4.500)

## 2022-08-27 ENCOUNTER — Encounter: Payer: Self-pay | Admitting: Family Medicine

## 2022-08-27 ENCOUNTER — Ambulatory Visit (INDEPENDENT_AMBULATORY_CARE_PROVIDER_SITE_OTHER): Payer: Commercial Managed Care - PPO | Admitting: Family Medicine

## 2022-08-27 ENCOUNTER — Other Ambulatory Visit (HOSPITAL_COMMUNITY): Payer: Self-pay

## 2022-08-27 VITALS — BP 116/81 | HR 100 | Temp 98.7°F | Ht 64.0 in | Wt 233.0 lb

## 2022-08-27 DIAGNOSIS — E8881 Metabolic syndrome: Secondary | ICD-10-CM | POA: Diagnosis not present

## 2022-08-27 DIAGNOSIS — F5081 Binge eating disorder: Secondary | ICD-10-CM

## 2022-08-27 DIAGNOSIS — Z79899 Other long term (current) drug therapy: Secondary | ICD-10-CM | POA: Diagnosis not present

## 2022-08-27 DIAGNOSIS — Z0000 Encounter for general adult medical examination without abnormal findings: Secondary | ICD-10-CM

## 2022-08-27 DIAGNOSIS — E038 Other specified hypothyroidism: Secondary | ICD-10-CM

## 2022-08-27 DIAGNOSIS — Z713 Dietary counseling and surveillance: Secondary | ICD-10-CM

## 2022-08-27 DIAGNOSIS — Z0001 Encounter for general adult medical examination with abnormal findings: Secondary | ICD-10-CM

## 2022-08-27 MED ORDER — LISDEXAMFETAMINE DIMESYLATE 30 MG PO CAPS
30.0000 mg | ORAL_CAPSULE | Freq: Every day | ORAL | 0 refills | Status: DC
Start: 2022-08-27 — End: 2022-10-21
  Filled 2022-08-27: qty 30, 30d supply, fill #0

## 2022-08-27 NOTE — Patient Instructions (Addendum)
Controlled Substance Guidelines:  1. You cannot get an early refill, even it is lost.  2. You cannot get controlled medications from any other doctor, unless it is the emergency department and related to a new problem or injury.  3. You cannot use alcohol, marijuana, cocaine or any other recreational drugs while using this medication. This is very dangerous.  4. You are willing to have your urine drug tested at each visit.  5. You will not drive while using this medication, because that can put yourself and others in serious danger of an accident. 6. If any medication is stolen, then there must be a police report to verify it, or it cannot be refilled.  7. I will not prescribe these medications for longer than 3 months.  8. You must bring your pill bottle to each visit.  9. You must use the same pharmacy for all refills for the medication, unless you clear it with me beforehand.  10. You cannot share or sell this medication.   Preventive Care 33-20 Years Old, Female Preventive care refers to lifestyle choices and visits with your health care provider that can promote health and wellness. Preventive care visits are also called wellness exams. What can I expect for my preventive care visit? Counseling During your preventive care visit, your health care provider may ask about your: Medical history, including: Past medical problems. Family medical history. Pregnancy history. Current health, including: Menstrual cycle. Method of birth control. Emotional well-being. Home life and relationship well-being. Sexual activity and sexual health. Lifestyle, including: Alcohol, nicotine or tobacco, and drug use. Access to firearms. Diet, exercise, and sleep habits. Work and work Astronomer. Sunscreen use. Safety issues such as seatbelt and bike helmet use. Physical exam Your health care provider may check your: Height and weight. These may be used to calculate your BMI (body mass index). BMI is  a measurement that tells if you are at a healthy weight. Waist circumference. This measures the distance around your waistline. This measurement also tells if you are at a healthy weight and may help predict your risk of certain diseases, such as type 2 diabetes and high blood pressure. Heart rate and blood pressure. Body temperature. Skin for abnormal spots. What immunizations do I need?  Vaccines are usually given at various ages, according to a schedule. Your health care provider will recommend vaccines for you based on your age, medical history, and lifestyle or other factors, such as travel or where you work. What tests do I need? Screening Your health care provider may recommend screening tests for certain conditions. This may include: Pelvic exam and Pap test. Lipid and cholesterol levels. Diabetes screening. This is done by checking your blood sugar (glucose) after you have not eaten for a while (fasting). Hepatitis B test. Hepatitis C test. HIV (human immunodeficiency virus) test. STI (sexually transmitted infection) testing, if you are at risk. BRCA-related cancer screening. This may be done if you have a family history of breast, ovarian, tubal, or peritoneal cancers. Talk with your health care provider about your test results, treatment options, and if necessary, the need for more tests. Follow these instructions at home: Eating and drinking  Eat a healthy diet that includes fresh fruits and vegetables, whole grains, lean protein, and low-fat dairy products. Take vitamin and mineral supplements as recommended by your health care provider. Do not drink alcohol if: Your health care provider tells you not to drink. You are pregnant, may be pregnant, or are planning to become pregnant.  If you drink alcohol: Limit how much you have to 0-1 drink a day. Know how much alcohol is in your drink. In the [place] one drink equals one 12 oz bottle of beer (355 mL), one 5 oz glass of wine  (148 mL), or one 1 oz glass of hard liquor (44 mL). Lifestyle Brush your teeth every morning and night with fluoride toothpaste. Floss one time each day. Exercise for at least 30 minutes 5 or more days each week. Do not use any products that contain nicotine or tobacco. These products include cigarettes, chewing tobacco, and vaping devices, such as e-cigarettes. If you need help quitting, ask your health care provider. Do not use drugs. If you are sexually active, practice safe sex. Use a condom or other form of protection to prevent STIs. If you do not wish to become pregnant, use a form of birth control. If you plan to become pregnant, see your health care provider for a prepregnancy visit. Find healthy ways to manage stress, such as: Meditation, yoga, or listening to music. Journaling. Talking to a trusted person. Spending time with friends and family. Minimize exposure to UV radiation to reduce your risk of skin cancer. Safety Always wear your seat belt while driving or riding in a vehicle. Do not drive: If you have been drinking alcohol. Do not ride with someone who has been drinking. If you have been using any mind-altering substances or drugs. While texting. When you are tired or distracted. Wear a helmet and other protective equipment during sports activities. If you have firearms in your house, make sure you follow all gun safety procedures. Seek help if you have been physically or sexually abused. What's next? Go to your health care provider once a year for an annual wellness visit. Ask your health care provider how often you should have your eyes and teeth checked. Stay up to date on all vaccines. This information is not intended to replace advice given to you by your health care provider. Make sure you discuss any questions you have with your health care provider. Document Revised: 07/25/2020 Document Reviewed: 07/25/2020 Elsevier Patient Education  2024 [employer].

## 2022-08-27 NOTE — Progress Notes (Signed)
Raven [name] is a 32 y.o. female presents to office today for annual physical exam examination.    Concerns today include: 1.  Obesity Patient was started on phentermine back in April.  She is only been on the high dose of phentermine since June.  She does feel difference in her clothing.  She has previously responded really well to this medication.  She denies any heart palpitations, insomnia, increased anxiety.  She admits to binging behaviors but these do seem to be slightly better than before, as her portion sizes are smaller and snacking is minimal.  Occupation: cardiac/ pacemaker nurse, Marital status: married, Substance use: none Health Maintenance Due  Topic Date Due   COVID-19 Vaccine (3 - Pfizer risk series) 04/14/2019   PAP SMEAR-Modifier  04/06/2020   Refills needed today: all Immunizations needed: Immunization History  Administered Date(s) Administered   Influenza,inj,Quad PF,6+ Mos 11/16/2020   Influenza-Unspecified 11/10/2013, 11/10/2016, 11/11/2019   PFIZER(Purple Top)SARS-COV-2 Vaccination 02/24/2019, 03/17/2019   Tdap 05/01/2014     Past Medical History:  Diagnosis Date   Autoimmune disease (HCC) 07/25/2020   Complication of anesthesia    Elevated liver enzymes    Endometriosis    Medical history non-contributory    Otosclerosis    PONV (postoperative nausea and vomiting)    SVT (supraventricular tachycardia)    Tachycardia    Social History   Socioeconomic History   Marital status: Married    Spouse name: Not on file   Number of children: Not on file   Years of education: Not on file   Highest education level: Bachelor's degree (e.g., BA, AB, BS)  Occupational History   Not on file  Tobacco Use   Smoking status: Never    Passive exposure: Never   Smokeless tobacco: Never  Vaping Use   Vaping status: Never Used  Substance and Sexual Activity   Alcohol use: No   Drug use: No   Sexual activity: Not Currently  Other Topics Concern    Not on file  Social History Narrative   Not on file   Social Determinants of Health   Financial Resource Strain: Low Risk  (05/19/2022)   Overall Financial Resource Strain (CARDIA)    Difficulty of Paying Living Expenses: Not hard at all  Food Insecurity: No Food Insecurity (05/19/2022)   Hunger Vital Sign    Worried About Running Out of Food in the Last Year: Never true    Ran Out of Food in the Last Year: Never true  Transportation Needs: No Transportation Needs (05/19/2022)   PRAPARE - Administrator, Civil Service (Medical): No    Lack of Transportation (Non-Medical): No  Physical Activity: Sufficiently Active (05/19/2022)   Exercise Vital Sign    Days of Exercise per Week: 5 days    Minutes of Exercise per Session: 30 min  Stress: No Stress Concern Present (05/19/2022)   [employer] of Occupational Health - Occupational Stress Questionnaire    Feeling of Stress : Not at all  Social Connections: Socially Integrated (05/19/2022)   Social Connection and Isolation Panel [NHANES]    Frequency of Communication with Friends and Family: More than three times a week    Frequency of Social Gatherings with Friends and Family: Once a week    Attends Religious Services: More than 4 times per year    Active Member of [employer] or Organizations: Yes    Attends Banker Meetings: More than 4 times per year  Marital Status: Married  Catering manager Violence: Not At Risk (02/12/2022)   Humiliation, Afraid, Rape, and Kick questionnaire    Fear of Current or Ex-Partner: No    Emotionally Abused: No    Physically Abused: No    Sexually Abused: No   Past Surgical History:  Procedure Laterality Date   CESAREAN SECTION N/A 06/14/2014   Procedure: CESAREAN SECTION;  Surgeon: Carrington Clamp, MD;  Location: [facility];  Service: Obstetrics;  Laterality: N/A;   CESAREAN SECTION WITH BILATERAL TUBAL LIGATION Bilateral 01/30/2022   Procedure: REPEAT CESAREAN SECTION WITH BILATERAL TUBAL  LIGATION EDC: 02-05-22 ALLERG: HYDROXYCHLOROQUINE, SULFA, WALNUT  PREVIOUS X 1;  Surgeon: Mitchel Honour, DO;  Location: [facility];  Service: Obstetrics;  Laterality: Bilateral;   EXCISION MASS ABDOMINAL N/A 10/21/2019   Procedure: EXCISION MASS ABDOMINAL;  Surgeon: Berna Bue, MD;  Location: [facility];  Service: General;  Laterality: N/A;   stebactomy     Family History  Problem Relation Age of Onset   Other Mother        Elevated liver enzymes   Diabetes Mother    Hypertension Mother    Thyroid disease Mother    Diabetes Father    Hypertension Father    Hyperlipidemia Father    Other Brother        Elevated liver enzymes   Asthma Paternal Aunt    Asthma Paternal Grandmother    Healthy Son    Allergic rhinitis Neg Hx    Eczema Neg Hx    Urticaria Neg Hx     Current Outpatient Medications:    labetalol (NORMODYNE) 200 MG tablet, Take 1 tablet (200 mg total) by mouth 3 (three) times daily., Disp: 270 tablet, Rfl: 0   levonorgestrel (MIRENA, 52 MG,) 20 MCG/DAY IUD, , Disp: , Rfl:    levothyroxine (SYNTHROID) 50 MCG tablet, Take 1 tablet (50 mcg total) by mouth daily before breakfast., Disp: 90 tablet, Rfl: 3   lisdexamfetamine (VYVANSE) 30 MG capsule, Take 1 capsule (30 mg total) by mouth daily. Please mail, Disp: 30 capsule, Rfl: 0   phentermine (ADIPEX-P) 37.5 MG tablet, Take 1 tablet (37.5 mg total) by mouth daily before breakfast., Disp: 30 tablet, Rfl: 1   sertraline (ZOLOFT) 50 MG tablet, Take 1 tablet (50 mg total) by mouth daily., Disp: 90 tablet, Rfl: 0  Allergies  Allergen Reactions   Macrobid [Nitrofurantoin] Hives   Other     Walnuts - burns tongue    Hydroxychloroquine Rash and Hives   Sulfamethoxazole-Trimethoprim Rash     ROS: Review of Systems Pertinent items noted in HPI and remainder of comprehensive ROS otherwise negative.    Physical exam BP 116/81   Pulse 100   Temp 98.7 F (37.1 C)   Ht 5\' 4"  (1.626 m)   Wt 233 lb (105.7 kg)   SpO2 97%    Breastfeeding No   BMI 39.99 kg/m  General appearance: alert, cooperative, appears stated age, and no distress Head: Normocephalic, without obvious abnormality, atraumatic Eyes: negative findings: lids and lashes normal, conjunctivae and sclerae normal, corneas clear, and pupils equal, round, reactive to light and accomodation Ears: normal TM's and external ear canals both ears Nose: Nares normal. Septum midline. Mucosa normal. No drainage or sinus tenderness. Throat: lips, mucosa, and tongue normal; teeth and gums normal Neck: no adenopathy, supple, symmetrical, trachea midline, and thyroid not enlarged, symmetric, no tenderness/mass/nodules Back: symmetric, no curvature. ROM normal. No CVA tenderness. Lungs: clear to auscultation bilaterally Heart: regular  rate and rhythm, S1, S2 normal, no murmur, click, rub or gallop Abdomen: soft, non-tender; bowel sounds normal; no masses,  no organomegaly Extremities: extremities normal, atraumatic, no cyanosis or edema Pulses: 2+ and symmetric Skin: Skin color, texture, turgor normal. No rashes or lesions Lymph nodes: Cervical, supraclavicular, and axillary nodes normal. Neurologic: Grossly normal Psych: Very pleasant, interactive.     05/20/2022    8:27 AM 03/05/2022    2:28 PM 11/22/2020   10:43 AM  Depression screen PHQ 2/9  Decreased Interest 0 0 0  Down, Depressed, Hopeless 0 0 0  PHQ - 2 Score 0 0 0  Altered sleeping 0 0 0  Tired, decreased energy 0 0   Change in appetite 0 0 1  Feeling bad or failure about yourself  0 0 0  Trouble concentrating 0 0 0  Moving slowly or fidgety/restless 0 0 0  Suicidal thoughts 0 0 0  PHQ-9 Score 0 0 1  Difficult doing work/chores Not difficult at all Not difficult at all Not difficult at all      05/20/2022    8:27 AM 03/05/2022    2:28 PM 11/22/2020   10:44 AM 09/28/2020    2:12 PM  GAD 7 : Generalized Anxiety Score  Nervous, Anxious, on Edge 0 0 0 1  Control/stop worrying 0 0 0 0  Worry too  much - different things 0 0 1 0  Trouble relaxing 0 0 0 0  Restless 0 0 0 0  Easily annoyed or irritable 0 0 0 0  Afraid - awful might happen 0 0 0 1  Total GAD 7 Score 0 0 1 2  Anxiety Difficulty Not difficult at all  Not difficult at all Not difficult at all    Assessment/ Plan: Raven [name] here for annual physical exam.   Annual physical exam  Morbid obesity (HCC) - Plan: ToxASSURE Select 13 (MW), Urine, lisdexamfetamine (VYVANSE) 30 MG capsule  Metabolic syndrome - Plan: ToxASSURE Select 13 (MW), Urine, lisdexamfetamine (VYVANSE) 30 MG capsule  Encounter for weight loss counseling - Plan: ToxASSURE Select 13 (MW), Urine, lisdexamfetamine (VYVANSE) 30 MG capsule  Controlled substance agreement signed - Plan: ToxASSURE Select 13 (MW), Urine  Binge eating disorder - Plan: lisdexamfetamine (VYVANSE) 30 MG capsule  Subclinical hypothyroidism - Plan: levothyroxine (SYNTHROID) 50 MCG tablet  There has not been any significant change with the phentermine so given the binge eating behaviors I favor switching her to Vyvanse.  Will start at 30 mg daily.  Plan to escalate in 30 days if needed.  Discussed potential side effects though I anticipate they will be minimal given tolerance of phentermine.  Patient's BMI is >30 mg/m2.  Patient's current BMI is Body mass index is 39.99 kg/m. Patient is currently enrolled in a healthy eating plan along with encouraged exercise.   Patient does not have a personal or family history of medullary thyroid carcinoma (MTC) or Multiple Endocrine Neoplasia syndrome type 2 (MEN 2).  We will plan for repeat cholesterol level after she has lost 5% of body weight.  Her thyroid levels were recently collected and were within normal range  UDS and CSC were updated as per office policy and the national narcotic database reviewed with no red flags.  Virtual visit okay in 1 month, patient is to reach out sooner if concerns arise   Counseled on healthy  lifestyle choices, including diet (rich in fruits, vegetables and lean meats and low in salt and simple carbohydrates) and exercise (  at least 30 minutes of moderate physical activity daily).  Raven Ellery M. Nadine Counts, DO

## 2022-08-28 ENCOUNTER — Other Ambulatory Visit (HOSPITAL_COMMUNITY): Payer: Self-pay

## 2022-08-28 MED ORDER — SERTRALINE HCL 50 MG PO TABS
50.0000 mg | ORAL_TABLET | Freq: Every day | ORAL | 3 refills | Status: DC
  Filled 2022-08-28 – 2022-11-19 (×2): qty 90, 90d supply, fill #0

## 2022-08-28 MED ORDER — LEVOTHYROXINE SODIUM 50 MCG PO TABS
50.0000 ug | ORAL_TABLET | Freq: Every day | ORAL | 3 refills | Status: DC
Start: 2022-08-28 — End: 2023-10-13
  Filled 2022-08-28 – 2022-09-23 (×2): qty 90, 90d supply, fill #0
  Filled 2023-01-15: qty 90, 90d supply, fill #1
  Filled 2023-03-17 – 2023-05-15 (×3): qty 90, 90d supply, fill #2
  Filled 2023-08-10: qty 90, 90d supply, fill #3

## 2022-08-29 LAB — TOXASSURE SELECT 13 (MW), URINE

## 2022-09-02 ENCOUNTER — Other Ambulatory Visit: Payer: Self-pay | Admitting: Oncology

## 2022-09-02 DIAGNOSIS — Z006 Encounter for examination for normal comparison and control in clinical research program: Secondary | ICD-10-CM

## 2022-09-04 ENCOUNTER — Other Ambulatory Visit (HOSPITAL_COMMUNITY)
Admission: AD | Admit: 2022-09-04 | Discharge: 2022-09-04 | Disposition: A | Discharge status: Home / Self Care | Payer: Commercial Managed Care - PPO | Source: Ambulatory Visit | Attending: Oncology | Admitting: Oncology

## 2022-09-04 ENCOUNTER — Encounter (INDEPENDENT_AMBULATORY_CARE_PROVIDER_SITE_OTHER): Payer: Commercial Managed Care - PPO | Admitting: Family Medicine

## 2022-09-04 DIAGNOSIS — M7651 Patellar tendinitis, right knee: Secondary | ICD-10-CM | POA: Diagnosis not present

## 2022-09-04 DIAGNOSIS — E8881 Metabolic syndrome: Secondary | ICD-10-CM

## 2022-09-04 DIAGNOSIS — Z006 Encounter for examination for normal comparison and control in clinical research program: Secondary | ICD-10-CM | POA: Insufficient documentation

## 2022-09-12 ENCOUNTER — Other Ambulatory Visit (HOSPITAL_COMMUNITY): Payer: Self-pay

## 2022-09-16 ENCOUNTER — Ambulatory Visit: Payer: Commercial Managed Care - PPO | Admitting: Plastic Surgery

## 2022-09-22 ENCOUNTER — Other Ambulatory Visit (HOSPITAL_COMMUNITY): Payer: Self-pay

## 2022-09-22 MED ORDER — QSYMIA 3.75-23 MG PO CP24
1.0000 | ORAL_CAPSULE | Freq: Every morning | ORAL | 0 refills | Status: DC
Start: 2022-09-22 — End: 2022-11-04
  Filled 2022-09-22: qty 30, 30d supply, fill #0
  Filled 2022-10-17: qty 14, 14d supply, fill #0

## 2022-09-22 NOTE — Telephone Encounter (Signed)
Please see the MyChart message reply(ies) for my assessment and plan.    This patient gave consent for this Medical Advice Message and is aware that it may result in a bill to their insurance company, as well as the possibility of receiving a bill for a co-payment or deductible. They are an established patient, but are not seeking medical advice exclusively about a problem treated during an in person or video visit in the last seven days. I did not recommend an in person or video visit within seven days of my reply.    I spent a total of 5 minutes cumulative time within 7 days through MyChart messaging.  Ashly Gottschalk, DO   

## 2022-09-23 ENCOUNTER — Other Ambulatory Visit (HOSPITAL_COMMUNITY): Payer: Self-pay

## 2022-09-25 ENCOUNTER — Other Ambulatory Visit (HOSPITAL_COMMUNITY): Payer: Self-pay

## 2022-09-26 DIAGNOSIS — Z01419 Encounter for gynecological examination (general) (routine) without abnormal findings: Secondary | ICD-10-CM | POA: Diagnosis not present

## 2022-09-26 DIAGNOSIS — D649 Anemia, unspecified: Secondary | ICD-10-CM | POA: Diagnosis not present

## 2022-09-26 DIAGNOSIS — N926 Irregular menstruation, unspecified: Secondary | ICD-10-CM | POA: Diagnosis not present

## 2022-09-26 DIAGNOSIS — Z30431 Encounter for routine checking of intrauterine contraceptive device: Secondary | ICD-10-CM | POA: Diagnosis not present

## 2022-09-26 DIAGNOSIS — Z6839 Body mass index (BMI) 39.0-39.9, adult: Secondary | ICD-10-CM | POA: Diagnosis not present

## 2022-09-29 ENCOUNTER — Other Ambulatory Visit (HOSPITAL_COMMUNITY): Payer: Self-pay

## 2022-10-01 ENCOUNTER — Telehealth: Payer: Self-pay

## 2022-10-01 NOTE — Telephone Encounter (Signed)
Unk Lightning (Key: Dianna Limbo) PA Case ID #: [id number] Rx #: [id number] Need Help? Call us at [phone] Status sent iconSent to Plan today Drug Qsymia 3.75-23MG  er capsules ePA cloud logo Form MedImpact ePA Form 2017 NCPDP

## 2022-10-03 ENCOUNTER — Other Ambulatory Visit (HOSPITAL_COMMUNITY): Payer: Self-pay

## 2022-10-03 MED ORDER — DOXYCYCLINE MONOHYDRATE 100 MG PO TABS
100.0000 mg | ORAL_TABLET | Freq: Two times a day (BID) | ORAL | 0 refills | Status: DC
  Filled 2022-10-03: qty 28, 14d supply, fill #0

## 2022-10-03 NOTE — Telephone Encounter (Signed)
Can you please look into this?

## 2022-10-03 NOTE — Telephone Encounter (Signed)
Pharmacy Patient Advocate Encounter  Received notification from [facility] that Prior Authorization for Qsymia 3.75-23MG  er capsules has been DENIED. Please advise how you'd like to proceed. Full denial letter will be uploaded to the media tab. See denial reason below.   PA #/Case ID/Reference #: [id number]

## 2022-10-07 ENCOUNTER — Encounter: Payer: Self-pay | Admitting: Internal Medicine

## 2022-10-08 ENCOUNTER — Encounter: Payer: Self-pay | Admitting: Family Medicine

## 2022-10-10 NOTE — Telephone Encounter (Signed)
Can you assist?

## 2022-10-16 ENCOUNTER — Other Ambulatory Visit (HOSPITAL_COMMUNITY): Payer: Self-pay

## 2022-10-16 DIAGNOSIS — N939 Abnormal uterine and vaginal bleeding, unspecified: Secondary | ICD-10-CM | POA: Diagnosis not present

## 2022-10-16 MED ORDER — NORETHINDRONE ACETATE 5 MG PO TABS
10.0000 mg | ORAL_TABLET | Freq: Every day | ORAL | 0 refills | Status: DC
  Filled 2022-10-16: qty 60, 30d supply, fill #0

## 2022-10-17 ENCOUNTER — Other Ambulatory Visit (HOSPITAL_BASED_OUTPATIENT_CLINIC_OR_DEPARTMENT_OTHER): Payer: Self-pay

## 2022-10-17 ENCOUNTER — Other Ambulatory Visit (HOSPITAL_COMMUNITY): Payer: Self-pay

## 2022-10-17 ENCOUNTER — Encounter (HOSPITAL_COMMUNITY): Payer: Self-pay

## 2022-10-21 ENCOUNTER — Encounter: Payer: Self-pay | Admitting: Family Medicine

## 2022-10-21 ENCOUNTER — Telehealth: Payer: Self-pay | Admitting: Family Medicine

## 2022-10-21 ENCOUNTER — Telehealth: Payer: Self-pay

## 2022-10-21 ENCOUNTER — Telehealth (INDEPENDENT_AMBULATORY_CARE_PROVIDER_SITE_OTHER): Payer: Commercial Managed Care - PPO | Admitting: Family Medicine

## 2022-10-21 ENCOUNTER — Other Ambulatory Visit: Payer: Self-pay | Admitting: Family Medicine

## 2022-10-21 DIAGNOSIS — Z713 Dietary counseling and surveillance: Secondary | ICD-10-CM | POA: Diagnosis not present

## 2022-10-21 DIAGNOSIS — E8881 Metabolic syndrome: Secondary | ICD-10-CM | POA: Diagnosis not present

## 2022-10-21 MED ORDER — SEMAGLUTIDE (2 MG/DOSE) 8 MG/3ML ~~LOC~~ SOPN: PEN_INJECTOR | SUBCUTANEOUS | Status: DC

## 2022-10-21 NOTE — Telephone Encounter (Signed)
Informed pt that Cindee Lame from [facility] called [place] with questions on the prescription. I tried calling Cindee Lame back but got voicemail. Informed pt once he calls back and we get things figured out I will call and let her know

## 2022-10-21 NOTE — Telephone Encounter (Signed)
Informed pt [facility] now has her prescription

## 2022-10-21 NOTE — Telephone Encounter (Signed)
Lmtcb.

## 2022-10-21 NOTE — Progress Notes (Signed)
MyChart Video visit  Subjective: CC: Weight loss counseling PCP: Raliegh Ip, DO [id number] Raven [name] is a 32 y.o. female. Patient provides verbal consent for consult held via video.  Due to COVID-19 pandemic this visit was conducted virtually. This visit type was conducted due to national recommendations for restrictions regarding the COVID-19 Pandemic (e.g. social distancing, sheltering in place) in an effort to limit this patient's exposure and mitigate transmission in our community. All issues noted in this document were discussed and addressed.  A physical exam was not performed with this format.   Location of patient: Car Location of provider: [facility] Others present for call: None  1.  Morbid obesity Patient was trialed on Vyvanse but it was not tolerated.  She was switched to Qsymia which was just covered by insurance.  She has yet to pick this up.  She has considered switching to a compounding pharmacy for GLP as this was efficacious for her in the past.  She goes on to say that she is been having excessive uterine bleeding and she is not sure if her hormone imbalance in fact has prevented her from losing weight with the stimulants recently.  She was placed on progesterone in hopes that this would stabilize her bleeding.  Otherwise she will have to undergo hysterectomy.     ROS: Per HPI  Allergies  Allergen Reactions   Macrobid [Nitrofurantoin] Hives   Other     Walnuts - burns tongue    Hydroxychloroquine Rash and Hives   Sulfamethoxazole-Trimethoprim Rash   Past Medical History:  Diagnosis Date   Autoimmune disease (HCC) 07/25/2020   Complication of anesthesia    Elevated liver enzymes    Endometriosis    Medical history non-contributory    Otosclerosis    PONV (postoperative nausea and vomiting)    SVT (supraventricular tachycardia)    Tachycardia     Current Outpatient Medications:    doxycycline (ADOXA) 100 MG tablet, Take 1 tablet (100 mg total) by mouth  2 (two) times daily for 14 days, Disp: 28 tablet, Rfl: 0   levonorgestrel (MIRENA, 52 MG,) 20 MCG/DAY IUD, , Disp: , Rfl:    levothyroxine (SYNTHROID) 50 MCG tablet, Take 1 tablet (50 mcg total) by mouth daily before breakfast., Disp: 90 tablet, Rfl: 3   lisdexamfetamine (VYVANSE) 30 MG capsule, Take 1 capsule (30 mg total) by mouth daily., Disp: 30 capsule, Rfl: 0   norethindrone (AYGESTIN) 5 MG tablet, Take 2 tablets (10 mg total) by mouth daily., Disp: 60 tablet, Rfl: 0   Phentermine-Topiramate (QSYMIA) 3.75-23 MG CP24, Take 1 capsule by mouth in the morning. To replace vyvanse, Disp: 30 capsule, Rfl: 0   sertraline (ZOLOFT) 50 MG tablet, Take 1 tablet (50 mg total) by mouth daily., Disp: 90 tablet, Rfl: 3  Gen: Well-appearing female in no acute distress  Assessment/ Plan: 32 y.o. female   Morbid obesity (HCC)  Metabolic syndrome  Encounter for weight loss counseling  She is interested in potentially going to a compounding pharmacy for semaglutide as this has been efficacious for her in the past.  Additionally, she is being treated with oral hormones due to excessive uterine bleeding.  I wonder if the GIP or GLP would actually help improve progesterone levels in this patient and stabilize her menstrual cycle.  She will message me with what she wants to do next.  No apparent contraindications to utilization of GIP or GLP in this patient  Start time: 8:01a End time: 8:08a  Total time  spent on patient care (including video visit/ documentation): 8 minutes  Raven [name] Hulen Skains, DO [facility] [phone]

## 2022-10-22 ENCOUNTER — Encounter: Payer: Self-pay | Admitting: Pharmacist

## 2022-10-22 ENCOUNTER — Telehealth: Payer: Self-pay | Admitting: Pharmacist

## 2022-10-22 NOTE — Telephone Encounter (Signed)
BMI 39.9 Previously on zepbound, but insurance cut medication

## 2022-10-23 ENCOUNTER — Other Ambulatory Visit (HOSPITAL_COMMUNITY): Payer: Self-pay

## 2022-10-24 ENCOUNTER — Other Ambulatory Visit (HOSPITAL_COMMUNITY): Payer: Self-pay

## 2022-10-27 ENCOUNTER — Telehealth: Payer: Commercial Managed Care - PPO | Admitting: Physician Assistant

## 2022-10-27 ENCOUNTER — Encounter: Payer: Self-pay | Admitting: Family Medicine

## 2022-10-27 ENCOUNTER — Other Ambulatory Visit (HOSPITAL_COMMUNITY): Payer: Self-pay

## 2022-10-27 DIAGNOSIS — B9689 Other specified bacterial agents as the cause of diseases classified elsewhere: Secondary | ICD-10-CM | POA: Diagnosis not present

## 2022-10-27 DIAGNOSIS — J208 Acute bronchitis due to other specified organisms: Secondary | ICD-10-CM | POA: Diagnosis not present

## 2022-10-27 MED ORDER — BENZONATATE 100 MG PO CAPS
100.0000 mg | ORAL_CAPSULE | Freq: Three times a day (TID) | ORAL | 0 refills | Status: DC | PRN
Start: 2022-10-27 — End: 2023-04-22
  Filled 2022-10-27: qty 30, 5d supply, fill #0

## 2022-10-27 MED ORDER — AZITHROMYCIN 250 MG PO TABS
ORAL_TABLET | ORAL | 0 refills | Status: AC
Start: 2022-10-27 — End: 2022-11-01
  Filled 2022-10-27: qty 6, 5d supply, fill #0

## 2022-10-27 NOTE — Progress Notes (Signed)
E-Visit for Cough  We are sorry that you are not feeling well.  Here is how we plan to help!  Based on your presentation I believe you most likely have A cough due to bacteria.  When patients have a fever and a productive cough with a change in color or increased sputum production, we are concerned about bacterial bronchitis.  If left untreated it can progress to pneumonia.  If your symptoms do not improve with your treatment plan it is important that you contact your provider.   I have prescribed Azithromyin 250 mg: two tablets now and then one tablet daily for 4 additonal days    In addition you may use A non-prescription cough medication called Mucinex DM: take 2 tablets every 12 hours. and A prescription cough medication called Tessalon Perles 100mg . You may take 1-2 capsules every 8 hours as needed for your cough.  From your responses in the eVisit questionnaire you describe inflammation in the upper respiratory tract which is causing a significant cough.  This is commonly called Bronchitis and has four common causes:   Allergies Viral Infections Acid Reflux Bacterial Infection Allergies, viruses and acid reflux are treated by controlling symptoms or eliminating the cause. An example might be a cough caused by taking certain blood pressure medications. You stop the cough by changing the medication. Another example might be a cough caused by acid reflux. Controlling the reflux helps control the cough.     HOME CARE Only take medications as instructed by your medical team. Complete the entire course of an antibiotic. Drink plenty of fluids and get plenty of rest. Avoid close contacts especially the very young and the elderly Cover your mouth if you cough or cough into your sleeve. Always remember to wash your hands A steam or ultrasonic humidifier can help congestion.   GET HELP RIGHT AWAY IF: You develop worsening fever. You become short of breath You cough up blood. Your symptoms  persist after you have completed your treatment plan MAKE SURE YOU  Understand these instructions. Will watch your condition. Will get help right away if you are not doing well or get worse.    Thank you for choosing an e-visit.  Your e-visit answers were reviewed by a board certified advanced clinical practitioner to complete your personal care plan. Depending upon the condition, your plan could have included both over the counter or prescription medications.  Please review your pharmacy choice. Make sure the pharmacy is open so you can pick up prescription now. If there is a problem, you may contact your provider through [employer] and have the prescription routed to another pharmacy.  Your safety is important to [place]. If you have drug allergies check your prescription carefully.   For the next 24 hours you can use MyChart to ask questions about today's visit, request a non-urgent call back, or ask for a work or school excuse. You will get an email in the next two days asking about your experience. I hope that your e-visit has been valuable and will speed your recovery.  I have spent 5 minutes in review of e-visit questionnaire, review and updating patient chart, medical decision making and response to patient.   Margaretann Loveless, PA-C

## 2022-10-28 ENCOUNTER — Ambulatory Visit: Payer: Self-pay

## 2022-10-29 ENCOUNTER — Ambulatory Visit: Payer: Commercial Managed Care - PPO

## 2022-11-04 NOTE — Telephone Encounter (Signed)
Approved Patient taking compounded semaglutide via [facility] at the moment Removed from med list

## 2022-11-12 ENCOUNTER — Other Ambulatory Visit (HOSPITAL_COMMUNITY): Payer: Self-pay

## 2022-11-12 LAB — HELIX MOLECULAR SCREEN: Genetic Analysis Overall Interpretation: NEGATIVE

## 2022-11-19 ENCOUNTER — Other Ambulatory Visit: Payer: Self-pay

## 2022-11-19 ENCOUNTER — Other Ambulatory Visit (HOSPITAL_COMMUNITY): Payer: Self-pay

## 2022-11-19 MED ORDER — NORETHINDRONE ACETATE 5 MG PO TABS
10.0000 mg | ORAL_TABLET | Freq: Every day | ORAL | 3 refills | Status: DC
  Filled 2022-11-19: qty 60, 30d supply, fill #0
  Filled 2023-01-15: qty 60, 30d supply, fill #1
  Filled 2023-03-17: qty 60, 30d supply, fill #2
  Filled 2023-04-22: qty 60, 30d supply, fill #3

## 2023-01-12 ENCOUNTER — Other Ambulatory Visit (HOSPITAL_COMMUNITY): Payer: Self-pay

## 2023-01-12 ENCOUNTER — Encounter: Payer: Self-pay | Admitting: Family Medicine

## 2023-01-12 DIAGNOSIS — F411 Generalized anxiety disorder: Secondary | ICD-10-CM

## 2023-01-12 DIAGNOSIS — F439 Reaction to severe stress, unspecified: Secondary | ICD-10-CM

## 2023-01-12 MED ORDER — SERTRALINE HCL 100 MG PO TABS
100.0000 mg | ORAL_TABLET | Freq: Every day | ORAL | 3 refills | Status: DC
Start: 2023-01-12 — End: 2023-10-13
  Filled 2023-01-12 – 2023-03-17 (×2): qty 90, 90d supply, fill #0
  Filled 2023-06-26: qty 90, 90d supply, fill #1
  Filled 2023-09-28: qty 90, 90d supply, fill #2

## 2023-01-12 NOTE — Telephone Encounter (Signed)
Please see the MyChart message reply(ies) for my assessment and plan.    This patient gave consent for this Medical Advice Message and is aware that it may result in a bill to their insurance company, as well as the possibility of receiving a bill for a co-payment or deductible. They are an established patient, but are not seeking medical advice exclusively about a problem treated during an in person or video visit in the last seven days. I did not recommend an in person or video visit within seven days of my reply.    I spent a total of 5 minutes cumulative time within 7 days through MyChart messaging.  Breyana Follansbee, DO   

## 2023-01-22 ENCOUNTER — Other Ambulatory Visit (HOSPITAL_COMMUNITY): Payer: Self-pay

## 2023-01-26 ENCOUNTER — Other Ambulatory Visit (HOSPITAL_COMMUNITY): Payer: Self-pay

## 2023-03-10 ENCOUNTER — Other Ambulatory Visit (HOSPITAL_COMMUNITY): Payer: Self-pay

## 2023-03-17 ENCOUNTER — Other Ambulatory Visit: Payer: Self-pay

## 2023-03-17 ENCOUNTER — Other Ambulatory Visit (HOSPITAL_COMMUNITY): Payer: Self-pay

## 2023-03-19 ENCOUNTER — Telehealth: Payer: Self-pay | Admitting: Family Medicine

## 2023-03-19 NOTE — Telephone Encounter (Signed)
 I would say he defer this to PCP because it looks like she is already on 2 mg that do not know if this is a compound pharmacy where it is going to but will defer until PCP returns

## 2023-03-19 NOTE — Telephone Encounter (Signed)
 Copied from CRM [id number]. Topic: Clinical - Prescription Issue >> Mar 19, 2023 12:19 PM Deleta HERO wrote: Reason for CRM: PT is wanting an increase of dosage for Wegovy  to be called in to her pharmacy, [facility]. She did not specify the dosage amount she would like, she stated she's unsure but would like a higher dose of the medication. Callback #[phone]

## 2023-03-20 ENCOUNTER — Telehealth: Payer: Self-pay | Admitting: Pharmacy Technician

## 2023-03-20 ENCOUNTER — Other Ambulatory Visit (HOSPITAL_COMMUNITY): Payer: Self-pay

## 2023-03-20 ENCOUNTER — Encounter: Payer: Self-pay | Admitting: Family Medicine

## 2023-03-20 MED ORDER — SEMAGLUTIDE (2 MG/DOSE) 8 MG/3ML ~~LOC~~ SOPN
2.0000 mg | PEN_INJECTOR | SUBCUTANEOUS | 0 refills | Status: DC
Start: 2023-03-20 — End: 2023-08-05
  Filled 2023-03-20 – 2023-06-04 (×2): qty 3, 28d supply, fill #0

## 2023-03-20 NOTE — Telephone Encounter (Signed)
 Pharmacy Patient Advocate Encounter   Received notification from CoverMyMeds that prior authorization for Ozempic  (2 MG/DOSE) 8MG /3ML pen-injectors is required/requested.   Insurance verification completed.   The patient is insured through [facility] .   Per test claim: PA required; PA submitted to above mentioned insurance via CoverMyMeds Key/confirmation #/EOC [id number] Status is pending

## 2023-03-23 ENCOUNTER — Encounter: Payer: Self-pay | Admitting: Family Medicine

## 2023-03-24 ENCOUNTER — Telehealth: Payer: Self-pay | Admitting: Pharmacist

## 2023-03-24 NOTE — Telephone Encounter (Deleted)
Pharmacy Patient Advocate Encounter  Received notification from [facility] that Prior Authorization for Ozempic (2 MG/DOSE) 8MG /3ML pen-injectors has been DENIED.  Full denial letter will be uploaded to the media tab. See denial reason below.   PA #/Case ID/Reference #: [id number]

## 2023-03-24 NOTE — Telephone Encounter (Signed)
Error

## 2023-03-24 NOTE — Telephone Encounter (Signed)
Pharmacy Patient Advocate Encounter  Received notification from [facility] that Prior Authorization for Ozempic (2 MG/DOSE) 8MG /3ML pen-injectors has been DENIED.  Full denial letter will be uploaded to the media tab. See denial reason below.   PA #/Case ID/Reference #: [id number]

## 2023-04-08 ENCOUNTER — Encounter: Payer: Self-pay | Admitting: Plastic Surgery

## 2023-04-20 NOTE — Progress Notes (Signed)
   Referring Provider Raliegh Ip, DO [address] [place],  [place] [address]   CC:  Chief Complaint  Patient presents with   Follow-up      Raven [name] is an 33 y.o. female.  HPI: Patient is a 33 y.o. year old female here to further discuss her breast reduction surgery.  She was seen for initial consult by Dr. Ulice Bold on 07/09/2022.  At that time, patient complained of upper back and neck pain due to her enlarged breast.  On exam, her BMI was 41 kg/m.  Her weight was 239 pounds.  Her preoperative bra size was a G cup.  The excess amount of breast tissue to be removed at the time of surgery was 1000 g bilaterally.  The patient expressed the desire to move forward with surgical intervention.  Patient was found to be a good candidate for bilateral breast reduction, but patient would need to lose a little bit more weight.  Patient was actively trying to decrease her weight.  Physical therapy was also ordered for the patient.  Goal was for patient to decrease her weight by 10 to 15 pounds.  Today, patient reports she is doing well.  She states that she has lost 15 pounds since she last saw Dr. Ulice Bold.  She states though that she did not do physical therapy as she was never contacted.  Patient states that she is still interested in pursuing breast reduction.  She states that she still has rashes underneath her breasts and has discomfort to her upper back.  She also states that she has difficulty finding close that fit appropriately and also states that she feels her enlarged breasts are affecting her posture.  Patient states that she is currently a G cup.   Review of Systems General: Does not report any changes in health MSK: Endorses ongoing back and neck discomfort Skin: Reports rashes  Physical Exam    04/22/2023    3:55 PM 04/22/2023    3:53 PM 10/22/2022    9:20 AM  Vitals with BMI  Height  5\' 4"  5\' 4"   Weight  220 lbs 13 oz 233 lbs  BMI  37.88 39.97  Systolic 133  136   Diastolic 84 90   Pulse  91     General:  No acute distress,  Alert and oriented, Non-Toxic, Normal speech and affect Psych: Normal behavior and mood Respiratory: No increased WOB MSK: Ambulatory  Assessment/Plan  Patient is interested in pursuing surgical intervention for bilateral breast reduction.  Recommended that she complete at least 6 sessions of physical therapy before submitting to insurance.  Patient expressed understanding and was in agreement with this.  Discussed with patient we would submit to insurance for authorization, discussed approval could take up to 6 weeks.   We will plan to do a telephone visit in about 8 weeks after she has completed physical therapy for follow-up.  I instructed her to call in the meantime if she has any questions or concerns about anything.  Laurena Spies 04/27/2023, 8:51 AM

## 2023-04-21 ENCOUNTER — Encounter: Payer: Self-pay | Admitting: Family Medicine

## 2023-04-21 NOTE — Telephone Encounter (Signed)
 Probably a good idea to offer this patient a visit, perhaps with DOD if I have nothing available.  Need to eval for dehydration/ electrolyte deficiency/ possible infectious diarrhea

## 2023-04-22 ENCOUNTER — Ambulatory Visit: Admitting: Family Medicine

## 2023-04-22 ENCOUNTER — Encounter: Payer: Self-pay | Admitting: Family Medicine

## 2023-04-22 VITALS — BP 133/84 | HR 91 | Temp 97.5°F | Ht 64.0 in | Wt 220.8 lb

## 2023-04-22 DIAGNOSIS — R197 Diarrhea, unspecified: Secondary | ICD-10-CM | POA: Diagnosis not present

## 2023-04-22 MED ORDER — AZITHROMYCIN 500 MG PO TABS: 500.0000 mg | ORAL_TABLET | Freq: Every day | ORAL | 0 refills | Status: AC

## 2023-04-22 NOTE — Progress Notes (Signed)
 Acute Office Visit  Subjective:     Patient ID: Raven [name], female    DOB: September 03, 1990, 33 y.o.   MRN: [id number]  Chief Complaint  Patient presents with   Diarrhea    Diarrhea  This is a new problem. Episode onset: 6 days. The problem occurs more than 10 times per day. The problem has been gradually improving. The stool consistency is described as Watery. The patient states that diarrhea awakens her from sleep. Associated symptoms include abdominal pain (improving, generalized discomfort), bloating, increased flatus and weight loss (on weight loss medication, no recent changes in dosage). Pertinent negatives include no chills, coughing, fever, headaches, myalgias, URI or vomiting. Associated symptoms comments: Nausea-improved. 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 Treatments tried: pepcid, tums. The treatment provided no relief. There is no history of bowel resection, inflammatory bowel disease, irritable bowel syndrome, malabsorption, a recent abdominal surgery or short gut syndrome.   Initially was having 25+ episodes of diarrhea a day. Started on 04/16/23. Improved after a few days, then worsened again. Has only had 1 episode of diarrhea today however. Denies dizziness, lightheadedness, weakness. Has been staying well hydrated. Denies changes in appetite.    Review of Systems  Constitutional:  Positive for weight loss (on weight loss medication, no recent changes in dosage). Negative for chills and fever.  Respiratory:  Negative for cough.   Gastrointestinal:  Positive for abdominal pain (improving, generalized discomfort), bloating, diarrhea and flatus. Negative for vomiting.  Musculoskeletal:  Negative for myalgias.  Neurological:  Negative for headaches.        Objective:    BP 133/84   Pulse 91   Temp (!) 97.5 F (36.4 C) (Temporal)   Ht 5\' 4"  (1.626 m)   Wt 220 lb 12.8 oz (100.2 kg)   SpO2 99%   BMI 37.90 kg/m  Wt Readings from Last 3 Encounters:  04/22/23 220 lb  12.8 oz (100.2 kg)  10/22/22 233 lb (105.7 kg)  08/27/22 233 lb (105.7 kg)      Physical Exam Vitals and nursing note reviewed.  Constitutional:      General: She is not in acute distress.    Appearance: She is not ill-appearing, toxic-appearing or diaphoretic.  HENT:     Mouth/Throat:     Mouth: Mucous membranes are moist.     Pharynx: Oropharynx is clear. No oropharyngeal exudate or posterior oropharyngeal erythema.  Eyes:     General: No scleral icterus. Cardiovascular:     Rate and Rhythm: Normal rate and regular rhythm.     Heart sounds: Normal heart sounds. No murmur heard. Pulmonary:     Effort: Pulmonary effort is normal. No respiratory distress.     Breath sounds: Normal breath sounds. No wheezing, rhonchi or rales.  Abdominal:     General: Bowel sounds are normal. There is no distension.     Palpations: Abdomen is soft.     Tenderness: There is no abdominal tenderness. There is no guarding or rebound. Negative signs include Murphy's sign and McBurney's sign.  Musculoskeletal:     Cervical back: Neck supple.     Right lower leg: No edema.     Left lower leg: No edema.  Skin:    General: Skin is warm and dry.     Coloration: Skin is not jaundiced.  Neurological:     General: No focal deficit present.     Mental Status: She is alert and oriented to person, place, and time.  Psychiatric:  Mood and Affect: Mood normal.        Behavior: Behavior normal.     No results found for any visits on 04/22/23.      Assessment & Plan:   Kanyia "Marliss Czar" was seen today for diarrhea.  Diagnoses and all orders for this visit:  Acute diarrhea Presume infectious etiology. Culture and labs pending as below. Start azithromycin after colleting culture if diarrhea persists. Discussed imodium OTC, hydration. Benign exam today.  -     CBC with Differential/Platelet -     CMP14+EGFR -     Cdiff NAA+O+P+Stool Culture -     TSH -     azithromycin (ZITHROMAX) 500 MG  tablet; Take 1 tablet (500 mg total) by mouth daily for 3 days.   Return if symptoms worsen or fail to improve.  The patient indicates understanding of these issues and agrees with the plan.  Gabriel Earing, FNP

## 2023-04-23 ENCOUNTER — Other Ambulatory Visit

## 2023-04-23 ENCOUNTER — Encounter: Payer: Self-pay | Admitting: Family Medicine

## 2023-04-23 DIAGNOSIS — R197 Diarrhea, unspecified: Secondary | ICD-10-CM | POA: Diagnosis not present

## 2023-04-23 LAB — CMP14+EGFR
ALT: 19 IU/L (ref 0–32)
AST: 15 IU/L (ref 0–40)
Albumin: 4.1 g/dL (ref 3.9–4.9)
Alkaline Phosphatase: 94 IU/L (ref 44–121)
BUN/Creatinine Ratio: 10 (ref 9–23)
BUN: 9 mg/dL (ref 6–20)
CO2: 21 mmol/L (ref 20–29)
Calcium: 9.5 mg/dL (ref 8.7–10.2)
Chloride: 107 mmol/L — ABNORMAL HIGH (ref 96–106)
Creatinine, Ser: 0.89 mg/dL (ref 0.57–1.00)
Globulin, Total: 2.9 g/dL (ref 1.5–4.5)
Glucose: 102 mg/dL — ABNORMAL HIGH (ref 70–99)
Potassium: 4 mmol/L (ref 3.5–5.2)
Sodium: 142 mmol/L (ref 134–144)
Total Protein: 7 g/dL (ref 6.0–8.5)
eGFR: 88 mL/min/{1.73_m2} (ref >59)

## 2023-04-23 LAB — CBC WITH DIFFERENTIAL/PLATELET
Basophils Absolute: 0 10*3/uL (ref 0.0–0.2)
Basos: 1 %
EOS (ABSOLUTE): 0.2 10*3/uL (ref 0.0–0.4)
Eos: 2 %
Hematocrit: 43.5 % (ref 34.0–46.6)
Hemoglobin: 14.3 g/dL (ref 11.1–15.9)
Immature Grans (Abs): 0 10*3/uL (ref 0.0–0.1)
Immature Granulocytes: 0 %
Lymphocytes Absolute: 2.9 10*3/uL (ref 0.7–3.1)
Lymphs: 34 %
MCH: 31 pg (ref 26.6–33.0)
MCHC: 32.9 g/dL (ref 31.5–35.7)
MCV: 94 fL (ref 79–97)
Monocytes Absolute: 0.5 10*3/uL (ref 0.1–0.9)
Monocytes: 6 %
Neutrophils Absolute: 4.9 10*3/uL (ref 1.4–7.0)
Neutrophils: 57 %
Platelets: 263 10*3/uL (ref 150–450)
RBC: 4.62 x10E6/uL (ref 3.77–5.28)
RDW: 12.8 % (ref 11.7–15.4)
WBC: 8.5 10*3/uL (ref 3.4–10.8)

## 2023-04-23 LAB — TSH: TSH: 0.719 u[IU]/mL (ref 0.450–4.500)

## 2023-04-27 ENCOUNTER — Ambulatory Visit: Admitting: Student

## 2023-04-27 VITALS — BP 133/98 | HR 93 | Ht 64.0 in | Wt 224.4 lb

## 2023-04-27 DIAGNOSIS — N62 Hypertrophy of breast: Secondary | ICD-10-CM | POA: Diagnosis not present

## 2023-04-27 DIAGNOSIS — R21 Rash and other nonspecific skin eruption: Secondary | ICD-10-CM | POA: Diagnosis not present

## 2023-04-27 DIAGNOSIS — M546 Pain in thoracic spine: Secondary | ICD-10-CM

## 2023-04-28 LAB — CDIFF NAA+O+P+STOOL CULTURE
E coli, Shiga toxin Assay: NEGATIVE
Toxigenic C. Difficile by PCR: NEGATIVE
Toxigenic C. Difficile by PCR: NEGATIVE

## 2023-04-28 NOTE — Progress Notes (Signed)
 Negative stool culture. Please follow up if symptoms continue.

## 2023-04-30 ENCOUNTER — Ambulatory Visit: Admitting: Physical Therapy

## 2023-05-05 ENCOUNTER — Other Ambulatory Visit (HOSPITAL_COMMUNITY): Payer: Self-pay

## 2023-05-11 NOTE — Therapy (Unsigned)
 OUTPATIENT PHYSICAL THERAPY CERVICAL EVALUATION   Patient Name: DEHLIA KILNER MRN: [id number] DOB:05/25/1990, 33 y.o., female Today's Date: 05/12/2023  END OF SESSION:  PT End of Session - 05/12/23 1300     Visit Number 1    Number of Visits 6    Date for PT Re-Evaluation 07/12/23    Authorization Type [place] AETNA    PT Start Time 1215    PT Stop Time 1300    PT Time Calculation (min) 45 min    Activity Tolerance Patient tolerated treatment well    Behavior During Therapy [facility] for tasks assessed/performed             Past Medical History:  Diagnosis Date   Autoimmune disease (HCC) 07/25/2020   Complication of anesthesia    Elevated liver enzymes    Endometriosis    Medical history non-contributory    Otosclerosis    PONV (postoperative nausea and vomiting)    SVT (supraventricular tachycardia) (HCC)    Tachycardia    Past Surgical History:  Procedure Laterality Date   CESAREAN SECTION N/A 06/14/2014   Procedure: CESAREAN SECTION;  Surgeon: Carrington Clamp, MD;  Location: [facility];  Service: Obstetrics;  Laterality: N/A;   CESAREAN SECTION WITH BILATERAL TUBAL LIGATION Bilateral 01/30/2022   Procedure: REPEAT CESAREAN SECTION WITH BILATERAL TUBAL LIGATION EDC: 02-05-22 ALLERG: HYDROXYCHLOROQUINE, SULFA, WALNUT  PREVIOUS X 1;  Surgeon: Mitchel Honour, DO;  Location: [facility];  Service: Obstetrics;  Laterality: Bilateral;   EXCISION MASS ABDOMINAL N/A 10/21/2019   Procedure: EXCISION MASS ABDOMINAL;  Surgeon: Berna Bue, MD;  Location: [facility] OR[facility];  Service: General;  Laterality: N/A;   stebactomy     Patient Active Problem List   Diagnosis Date Noted   Dupuytren's contracture of right hand 08/20/2022   Patellar tendonitis of right knee 08/20/2022   Pain in left foot 08/20/2022   Symptomatic mammary hypertrophy 07/09/2022   Back pain 07/09/2022   Preeclampsia in postpartum period 02/11/2022   Pre-eclampsia in postpartum period 02/11/2022   Previous  cesarean section 01/30/2022   S/P cesarean section 01/30/2022   Morbid obesity (HCC) 09/28/2020   Rash and other nonspecific skin eruption 09/10/2020   Positive ANA (antinuclear antibody) 07/25/2020   High risk medication use 07/25/2020   Autoimmune disease (HCC) 07/25/2020   Pain of right thumb 06/12/2020   Adverse reaction to food, subsequent encounter 05/11/2020   Pain of breast 12/02/2019   Abdominal mass 07/22/2019   Multiple joint pain 03/22/2019   Bilateral hearing loss 11/01/2018   Well adult exam 11/01/2018   Endometriosis 10/12/2018   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   Otosclerosis 09/03/2018   Cesarean delivery delivered 06/15/2014    PCP: Raliegh Ip, DO   REFERRING PROVIDER: Laurena Spies, PA-C   REFERRING DIAG: N62 (ICD-10-CM) - Symptomatic mammary hypertrophy   THERAPY DIAG:  Cervicalgia  Abnormal posture  Thoracic myofascial strain, subsequent encounter  Rationale for Evaluation and Treatment: Rehabilitation  ONSET DATE: chronic  SUBJECTIVE:  SUBJECTIVE STATEMENT: Reports a chronic history of mainly neck and upper back pain.  She will undergo breast reduction once OPPT requirement has been fulfilled. Hand dominance: Right  PERTINENT HISTORY:  non  PAIN:  Are you having pain? Yes: NPRS scale: 8/10 Pain location: cervical and upper back Pain description: ache  Aggravating factors: OH reaching, running Relieving factors: stretching and gravity eliminated positions  PRECAUTIONS: None  RED FLAGS: None    WEIGHT BEARING RESTRICTIONS: No  FALLS:  Has patient fallen in last 6 months? No  OCCUPATION: Nurse  PLOF: Independent  PATIENT GOALS: To manage my  NEXT  MD VISIT: TBD  OBJECTIVE:  Note: Objective measures were completed at Evaluation unless otherwise noted.  DIAGNOSTIC FINDINGS:  none  PATIENT SURVEYS:  Patient-specific activity scoring scheme (Point to one number):  "0" represents "unable to perform." "10" represents "able to perform at prior level. 0 1 2 3 4 5 6 7 8 9  10 (Date and Score) Activity Initial  Activity Eval     Computer work   5    running  6    Overhead work 6    Additional Additional Total score = sum of the activity scores/number of activities Minimum detectable change (90%CI) for average score = 2 points Minimum detectable change (90%CI) for single activity score = 3 points PSFS developed by: Jake Seats., & Binkley, J. (1995). Assessing disability and change on individual  patients: a report of a patient specific measure. Physiotherapy [place], 47, [phone]. Reproduced with the permission of the authors  Score: 17/30  POSTURE: rounded shoulders, forward head, and increased thoracic kyphosis  PALPATION: TTP B UT   CERVICAL ROM:   Active ROM A/PROM (deg) eval  Flexion 90%P!  Extension 50%P!  Right lateral flexion 90%  Left lateral flexion 75%  Right rotation 90%  Left rotation 75%   (Blank rows = not tested)  UPPER EXTREMITY ROM: WNL  Active ROM Right eval Left eval  Shoulder flexion    Shoulder extension    Shoulder abduction    Shoulder adduction    Shoulder extension    Shoulder internal rotation    Shoulder external rotation    Elbow flexion    Elbow extension    Wrist flexion    Wrist extension    Wrist ulnar deviation    Wrist radial deviation    Wrist pronation    Wrist supination     (Blank rows = not tested)  UPPER EXTREMITY MMT: WNL  MMT Right eval Left eval  Shoulder flexion    Shoulder extension    Shoulder abduction    Shoulder adduction    Shoulder extension    Shoulder internal rotation    Shoulder external rotation    Middle  trapezius    Lower trapezius    Elbow flexion    Elbow extension    Wrist flexion    Wrist extension    Wrist ulnar deviation    Wrist radial deviation    Wrist pronation    Wrist supination    Grip strength     (Blank rows = not tested)  CERVICAL SPECIAL TESTS:  Neck flexor muscle endurance test: Negative, Upper limb tension test (ULTT): Positive, and Spurling's test: Negative   FUNCTIONAL TESTS:  30 seconds chair stand test  TREATMENT DATE:  [facility] Adult PT Treatment:  DATE: 05/12/23 Eval and HEP Self Care: Additional minutes spent for educating on updated Therapeutic Home Exercise Program as well as comparing current status to condition at start of symptoms. This included exercises focusing on stretching, strengthening, with focus on eccentric aspects. Long term goals include an improvement in range of motion, strength, endurance as well as avoiding reinjury. Patient's frequency would include in 1-2 times a day, 3-5 times a week for a duration of 6-12 weeks. Proper technique shown and discussed handout in great detail. All questions were discussed and addressed.                                                                                                                                PATIENT EDUCATION:  Education details: Discussed eval findings, rehab rationale and POC and patient is in agreement  Person educated: Patient Education method: Explanation Education comprehension: verbalized understanding and needs further education  HOME EXERCISE PROGRAM: Access Code: [email or web address] URL: [email or web address] Date: 05/12/2023 Prepared by: Gustavus Bryant  Exercises - Standing Shoulder Horizontal Abduction with Resistance  - 1 x daily - 5 x weekly - 2 sets - 15 reps - Shoulder External Rotation and Scapular Retraction with Resistance  - 1 x daily - 5 x weekly - 2 sets - 15 reps - Doorway Pec Stretch at 90 Degrees Abduction   - 1 x daily - 5 x weekly - 1 sets - 3 reps - 30s hold  ASSESSMENT:  CLINICAL IMPRESSION: Patient is a 33 y.o. female who was seen today for physical therapy evaluation and treatment for chronic neck and upper back pain releated to mammary hypertrophy.  She presents with full UE AROM and strength, deep neck flexor endurance test and Spurling's are negative.  Cervical AROM limited by soft tissue restrictions especially in R scalene group.  ULTT test positive on R due to soft tissue restrictions. TrPs detected in B UT and levators,   OBJECTIVE IMPAIRMENTS: decreased activity tolerance, decreased endurance, decreased ROM, increased fascial restrictions, impaired perceived functional ability, increased muscle spasms, impaired UE functional use, postural dysfunction, and pain.   ACTIVITY LIMITATIONS: carrying, lifting, sitting, and reach over head  PERSONAL FACTORS: Fitness and Time since onset of injury/illness/exacerbation are also affecting patient's functional outcome.   REHAB POTENTIAL: Good  CLINICAL DECISION MAKING: Stable/uncomplicated  EVALUATION COMPLEXITY: Low   GOALS: Goals reviewed with patient? No  SHORT TERM GOALS=LONG TERM GOALS: Target date: 06/22/2023    Patient to demonstrate independence in HEP  Baseline: [email or web address] Goal status: INITIAL  2.  Patient will acknowledge 4/10 pain at least once during episode of care   Baseline: 6/10 Goal status: INITIAL  3.  Patient will score at least 67% on PSFS to signify clinically meaningful improvement in functional abilities.   Baseline: 57% Goal status: INITIAL  4.  Increase AROM cervical spine to 90% Baseline:  Active ROM A/PROM (deg) eval  Flexion 90%P!  Extension 50%P!  Right lateral flexion 90%  Left lateral flexion 75%  Right rotation 90%  Left rotation 75%   Goal status: INITIAL   PLAN:  PT FREQUENCY: 1x/week  PT DURATION: 6 weeks  PLANNED INTERVENTIONS: 97164- PT Re-evaluation, 97110-Therapeutic  exercises, 97530- Therapeutic activity, 97112- Neuromuscular re-education, 97535- Self Care, 86578- Manual therapy, Patient/Family education, Dry Needling, Joint mobilization, and Spinal mobilization  PLAN FOR NEXT SESSION: HEP review and update, manual techniques as appropriate, aerobic tasks, ROM and flexibility activities, strengthening and PREs, TPDN, gait and balance training as needed     Hildred Laser, PT 05/12/2023, 1:01 PM

## 2023-05-12 ENCOUNTER — Ambulatory Visit: Attending: Student

## 2023-05-12 ENCOUNTER — Other Ambulatory Visit: Payer: Self-pay

## 2023-05-12 DIAGNOSIS — M542 Cervicalgia: Secondary | ICD-10-CM | POA: Diagnosis not present

## 2023-05-12 DIAGNOSIS — R293 Abnormal posture: Secondary | ICD-10-CM | POA: Diagnosis not present

## 2023-05-12 DIAGNOSIS — S29019D Strain of muscle and tendon of unspecified wall of thorax, subsequent encounter: Secondary | ICD-10-CM | POA: Diagnosis not present

## 2023-05-12 DIAGNOSIS — N62 Hypertrophy of breast: Secondary | ICD-10-CM | POA: Diagnosis not present

## 2023-05-12 NOTE — Patient Instructions (Signed)

## 2023-05-15 ENCOUNTER — Other Ambulatory Visit (HOSPITAL_COMMUNITY): Payer: Self-pay

## 2023-05-20 NOTE — Therapy (Unsigned)
 OUTPATIENT PHYSICAL THERAPY CERVICAL EVALUATION   Patient Name: Raven [name] MRN: [id number] DOB:Jul 20, 1990, 33 y.o., female Today's Date: 05/20/2023  END OF SESSION:    Past Medical History:  Diagnosis Date   Autoimmune disease (HCC) 07/25/2020   Complication of anesthesia    Elevated liver enzymes    Endometriosis    Medical history non-contributory    Otosclerosis    PONV (postoperative nausea and vomiting)    SVT (supraventricular tachycardia) (HCC)    Tachycardia    Past Surgical History:  Procedure Laterality Date   CESAREAN SECTION N/A 06/14/2014   Procedure: CESAREAN SECTION;  Surgeon: Carrington Clamp, MD;  Location: [facility];  Service: Obstetrics;  Laterality: N/A;   CESAREAN SECTION WITH BILATERAL TUBAL LIGATION Bilateral 01/30/2022   Procedure: REPEAT CESAREAN SECTION WITH BILATERAL TUBAL LIGATION EDC: 02-05-22 ALLERG: HYDROXYCHLOROQUINE, SULFA, WALNUT  PREVIOUS X 1;  Surgeon: Mitchel Honour, DO;  Location: [facility];  Service: Obstetrics;  Laterality: Bilateral;   EXCISION MASS ABDOMINAL N/A 10/21/2019   Procedure: EXCISION MASS ABDOMINAL;  Surgeon: Berna Bue, MD;  Location: [facility];  Service: General;  Laterality: N/A;   stebactomy     Patient Active Problem List   Diagnosis Date Noted   Dupuytren's contracture of right hand 08/20/2022   Patellar tendonitis of right knee 08/20/2022   Pain in left foot 08/20/2022   Symptomatic mammary hypertrophy 07/09/2022   Back pain 07/09/2022   Preeclampsia in postpartum period 02/11/2022   Pre-eclampsia in postpartum period 02/11/2022   Previous cesarean section 01/30/2022   S/P cesarean section 01/30/2022   Morbid obesity (HCC) 09/28/2020   Rash and other nonspecific skin eruption 09/10/2020   Positive ANA (antinuclear antibody) 07/25/2020   High risk medication use 07/25/2020   Autoimmune disease (HCC) 07/25/2020   Pain of right thumb 06/12/2020   Adverse reaction to food, subsequent encounter 05/11/2020    Pain of breast 12/02/2019   Abdominal mass 07/22/2019   Multiple joint pain 03/22/2019   Bilateral hearing loss 11/01/2018   Well adult exam 11/01/2018   Endometriosis 10/12/2018   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   Otosclerosis 09/03/2018   Cesarean delivery delivered 06/15/2014    PCP: Raliegh Ip, DO   REFERRING PROVIDER: Laurena Spies, PA-C   REFERRING DIAG: N62 (ICD-10-CM) - Symptomatic mammary hypertrophy   THERAPY DIAG:  No diagnosis found.  Rationale for Evaluation and Treatment: Rehabilitation  ONSET DATE: chronic  SUBJECTIVE:  SUBJECTIVE STATEMENT: Reports a chronic history of mainly neck and upper back pain.  She will undergo breast reduction once OPPT requirement has been fulfilled. Hand dominance: Right  PERTINENT HISTORY:  non  PAIN:  Are you having pain? Yes: NPRS scale: 8/10 Pain location: cervical and upper back Pain description: ache  Aggravating factors: OH reaching, running Relieving factors: stretching and gravity eliminated positions  PRECAUTIONS: None  RED FLAGS: None    WEIGHT BEARING RESTRICTIONS: No  FALLS:  Has patient fallen in last 6 months? No  OCCUPATION: Nurse  PLOF: Independent  PATIENT GOALS: To manage my  NEXT MD VISIT: TBD  OBJECTIVE:  Note: Objective measures were completed at Evaluation unless otherwise noted.  DIAGNOSTIC FINDINGS:  none  PATIENT SURVEYS:  Patient-specific activity scoring scheme (Point to one number):  "0" represents "unable to perform." "10" represents "able to perform at prior level. 0 1 2 3 4 5 6 7 8 9  10 (Date and Score) Activity Initial  Activity Eval     Computer work   5    running  6    Overhead  work 6    Additional Additional Total score = sum of the activity scores/number of activities Minimum detectable change (90%CI) for average score = 2 points Minimum detectable change (90%CI) for single activity score = 3 points PSFS developed by: Jake Seats., & Binkley, J. (1995). Assessing disability and change on individual  patients: a report of a patient specific measure. Physiotherapy [place], 47, [phone]. Reproduced with the permission of the authors  Score: 17/30  POSTURE: rounded shoulders, forward head, and increased thoracic kyphosis  PALPATION: TTP B UT   CERVICAL ROM:   Active ROM A/PROM (deg) eval  Flexion 90%P!  Extension 50%P!  Right lateral flexion 90%  Left lateral flexion 75%  Right rotation 90%  Left rotation 75%   (Blank rows = not tested)  UPPER EXTREMITY ROM: WNL  Active ROM Right eval Left eval  Shoulder flexion    Shoulder extension    Shoulder abduction    Shoulder adduction    Shoulder extension    Shoulder internal rotation    Shoulder external rotation    Elbow flexion    Elbow extension    Wrist flexion    Wrist extension    Wrist ulnar deviation    Wrist radial deviation    Wrist pronation    Wrist supination     (Blank rows = not tested)  UPPER EXTREMITY MMT: WNL  MMT Right eval Left eval  Shoulder flexion    Shoulder extension    Shoulder abduction    Shoulder adduction    Shoulder extension    Shoulder internal rotation    Shoulder external rotation    Middle trapezius    Lower trapezius    Elbow flexion    Elbow extension    Wrist flexion    Wrist extension    Wrist ulnar deviation    Wrist radial deviation    Wrist pronation    Wrist supination    Grip strength     (Blank rows = not tested)  CERVICAL SPECIAL TESTS:  Neck flexor muscle endurance test: Negative, Upper limb tension test (ULTT): Positive, and Spurling's test: Negative   FUNCTIONAL TESTS:  30 seconds chair stand  test  TREATMENT DATE:  [facility] Adult PT Treatment:  DATE: 05/12/23 Eval and HEP Self Care: Additional minutes spent for educating on updated Therapeutic Home Exercise Program as well as comparing current status to condition at start of symptoms. This included exercises focusing on stretching, strengthening, with focus on eccentric aspects. Long term goals include an improvement in range of motion, strength, endurance as well as avoiding reinjury. Patient's frequency would include in 1-2 times a day, 3-5 times a week for a duration of 6-12 weeks. Proper technique shown and discussed handout in great detail. All questions were discussed and addressed.                                                                                                                                PATIENT EDUCATION:  Education details: Discussed eval findings, rehab rationale and POC and patient is in agreement  Person educated: Patient Education method: Explanation Education comprehension: verbalized understanding and needs further education  HOME EXERCISE PROGRAM: Access Code: [email or web address] URL: [email or web address] Date: 05/12/2023 Prepared by: Gustavus Bryant  Exercises - Standing Shoulder Horizontal Abduction with Resistance  - 1 x daily - 5 x weekly - 2 sets - 15 reps - Shoulder External Rotation and Scapular Retraction with Resistance  - 1 x daily - 5 x weekly - 2 sets - 15 reps - Doorway Pec Stretch at 90 Degrees Abduction  - 1 x daily - 5 x weekly - 1 sets - 3 reps - 30s hold  ASSESSMENT:  CLINICAL IMPRESSION: Patient is a 33 y.o. female who was seen today for physical therapy evaluation and treatment for chronic neck and upper back pain releated to mammary hypertrophy.  She presents with full UE AROM and strength, deep neck flexor endurance test and Spurling's are negative.  Cervical AROM limited by soft tissue restrictions especially in R scalene  group.  ULTT test positive on R due to soft tissue restrictions. TrPs detected in B UT and levators,   OBJECTIVE IMPAIRMENTS: decreased activity tolerance, decreased endurance, decreased ROM, increased fascial restrictions, impaired perceived functional ability, increased muscle spasms, impaired UE functional use, postural dysfunction, and pain.   ACTIVITY LIMITATIONS: carrying, lifting, sitting, and reach over head  PERSONAL FACTORS: Fitness and Time since onset of injury/illness/exacerbation are also affecting patient's functional outcome.   REHAB POTENTIAL: Good  CLINICAL DECISION MAKING: Stable/uncomplicated  EVALUATION COMPLEXITY: Low   GOALS: Goals reviewed with patient? No  SHORT TERM GOALS=LONG TERM GOALS: Target date: 06/22/2023    Patient to demonstrate independence in HEP  Baseline: [email or web address] Goal status: INITIAL  2.  Patient will acknowledge 4/10 pain at least once during episode of care   Baseline: 6/10 Goal status: INITIAL  3.  Patient will score at least 67% on PSFS to signify clinically meaningful improvement in functional abilities.   Baseline: 57% Goal status: INITIAL  4.  Increase AROM cervical spine to 90% Baseline:  Active ROM A/PROM (deg) eval  Flexion 90%P!  Extension 50%P!  Right lateral flexion 90%  Left lateral flexion 75%  Right rotation 90%  Left rotation 75%   Goal status: INITIAL   PLAN:  PT FREQUENCY: 1x/week  PT DURATION: 6 weeks  PLANNED INTERVENTIONS: 97164- PT Re-evaluation, 97110-Therapeutic exercises, 97530- Therapeutic activity, 97112- Neuromuscular re-education, 97535- Self Care, 16109- Manual therapy, Patient/Family education, Dry Needling, Joint mobilization, and Spinal mobilization  PLAN FOR NEXT SESSION: HEP review and update, manual techniques as appropriate, aerobic tasks, ROM and flexibility activities, strengthening and PREs, TPDN, gait and balance training as needed     Hildred Laser, PT 05/20/2023, 2:15  PM

## 2023-05-22 ENCOUNTER — Ambulatory Visit

## 2023-05-22 DIAGNOSIS — S29019D Strain of muscle and tendon of unspecified wall of thorax, subsequent encounter: Secondary | ICD-10-CM

## 2023-05-22 DIAGNOSIS — R293 Abnormal posture: Secondary | ICD-10-CM | POA: Diagnosis not present

## 2023-05-22 DIAGNOSIS — M542 Cervicalgia: Secondary | ICD-10-CM | POA: Diagnosis not present

## 2023-05-22 DIAGNOSIS — N62 Hypertrophy of breast: Secondary | ICD-10-CM | POA: Diagnosis not present

## 2023-05-25 NOTE — Therapy (Unsigned)
 OUTPATIENT PHYSICAL THERAPY TREATMENT NOTE   Patient Name: Raven [name] MRN: [id number] DOB:06/06/1990, 33 y.o., female Today's Date: 05/27/2023  END OF SESSION:  PT End of Session - 05/27/23 1222     Visit Number 3    Number of Visits 6    Date for PT Re-Evaluation 07/12/23    Authorization Type [place] [employer]ET[employer]    PT Start Time 1218    PT Stop Time 1300    PT Time Calculation (min) 42 min    Activity Tolerance Patient tolerated treatment well    Behavior During Therapy [facility] for tasks assessed/performed               Past Medical History:  Diagnosis Date   Autoimmune disease (HCC) 07/25/2020   Complication of anesthesia    Elevated liver enzymes    Endometriosis    Medical history non-contributory    Otosclerosis    PONV (postoperative nausea and vomiting)    SVT (supraventricular tachycardia) (HCC)    Tachycardia    Past Surgical History:  Procedure Laterality Date   CESAREAN SECTION N/A 06/14/2014   Procedure: CESAREAN SECTION;  Surgeon: Matt Song, MD;  Location: [facility];  Service: Obstetrics;  Laterality: N/A;   CESAREAN SECTION WITH BILATERAL TUBAL LIGATION Bilateral 01/30/2022   Procedure: REPEAT CESAREAN SECTION WITH BILATERAL TUBAL LIGATION EDC: 02-05-22 ALLERG: HYDROXYCHLOROQUINE, SULFA, WALNUT  PREVIOUS X 1;  Surgeon: Dyanna Glasgow, DO;  Location: [facility];  Service: Obstetrics;  Laterality: Bilateral;   EXCISION MASS ABDOMINAL N/A 10/21/2019   Procedure: EXCISION MASS ABDOMINAL;  Surgeon: Adalberto Acton, MD;  Location: [facility];  Service: General;  Laterality: N/A;   stebactomy     Patient Active Problem List   Diagnosis Date Noted   Dupuytren's contracture of right hand 08/20/2022   Patellar tendonitis of right knee 08/20/2022   Pain in left foot 08/20/2022   Symptomatic mammary hypertrophy 07/09/2022   Back pain 07/09/2022   Preeclampsia in postpartum period 02/11/2022   Pre-eclampsia in postpartum period 02/11/2022   Previous  cesarean section 01/30/2022   S/P cesarean section 01/30/2022   Morbid obesity (HCC) 09/28/2020   Rash and other nonspecific skin eruption 09/10/2020   Positive ANA (antinuclear antibody) 07/25/2020   High risk medication use 07/25/2020   Autoimmune disease (HCC) 07/25/2020   Pain of right thumb 06/12/2020   Adverse reaction to food, subsequent encounter 05/11/2020   Pain of breast 12/02/2019   Abdominal mass 07/22/2019   Multiple joint pain 03/22/2019   Bilateral hearing loss 11/01/2018   Well adult exam 11/01/2018   Endometriosis 10/12/2018   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   Otosclerosis 09/03/2018   Cesarean delivery delivered 06/15/2014    PCP: Eliodoro Guerin, DO   REFERRING PROVIDER: Harden Leyden, PA-C   REFERRING DIAG: N62 (ICD-10-CM) - Symptomatic mammary hypertrophy   THERAPY DIAG:  Cervicalgia  Abnormal posture  Thoracic myofascial strain, subsequent encounter  Rationale for Evaluation and Treatment: Rehabilitation  ONSET DATE: chronic  SUBJECTIVE:  SUBJECTIVE STATEMENT: Returns from a beach trip.  Straps from bathing suit irritated shoulders.  Agreeable to DN today Hand dominance: Right  PERTINENT HISTORY:  non  PAIN:  Are you having pain? Yes: NPRS scale: 8/10 Pain location: cervical and upper back Pain description: ache  Aggravating factors: OH reaching, running Relieving factors: stretching and gravity eliminated positions  PRECAUTIONS: None  RED FLAGS: None    WEIGHT BEARING RESTRICTIONS: No  FALLS:  Has patient fallen in last 6 months? No  OCCUPATION: Nurse  PLOF: Independent  PATIENT GOALS: To manage my  NEXT MD VISIT: TBD  OBJECTIVE:  Note:  Objective measures were completed at Evaluation unless otherwise noted.  DIAGNOSTIC FINDINGS:  none  PATIENT SURVEYS:  Patient-specific activity scoring scheme (Point to one number):  "0" represents "unable to perform." "10" represents "able to perform at prior level. 0 1 2 3 4 5 6 7 8 9  10 (Date and Score) Activity Initial  Activity Eval     Computer work   5    running  6    Overhead work 6    Additional Additional Total score = sum of the activity scores/number of activities Minimum detectable change (90%CI) for average score = 2 points Minimum detectable change (90%CI) for single activity score = 3 points PSFS developed by: Natividad Brood, M., & Binkley, J. (1995). Assessing disability and change on individual  patients: a report of a patient specific measure. Physiotherapy [place], 47, [phone]. Reproduced with the permission of the authors  Score: 17/30  POSTURE: rounded shoulders, forward head, and increased thoracic kyphosis  PALPATION: TTP B UT   CERVICAL ROM:   Active ROM A/PROM (deg) eval  Flexion 90%P!  Extension 50%P!  Right lateral flexion 90%  Left lateral flexion 75%  Right rotation 90%  Left rotation 75%   (Blank rows = not tested)  UPPER EXTREMITY ROM: WNL  Active ROM Right eval Left eval  Shoulder flexion    Shoulder extension    Shoulder abduction    Shoulder adduction    Shoulder extension    Shoulder internal rotation    Shoulder external rotation    Elbow flexion    Elbow extension    Wrist flexion    Wrist extension    Wrist ulnar deviation    Wrist radial deviation    Wrist pronation    Wrist supination     (Blank rows = not tested)  UPPER EXTREMITY MMT: WNL  MMT Right eval Left eval  Shoulder flexion    Shoulder extension    Shoulder abduction    Shoulder adduction    Shoulder extension    Shoulder internal rotation    Shoulder external rotation    Middle trapezius    Lower trapezius    Elbow  flexion    Elbow extension    Wrist flexion    Wrist extension    Wrist ulnar deviation    Wrist radial deviation    Wrist pronation    Wrist supination    Grip strength     (Blank rows = not tested)  CERVICAL SPECIAL TESTS:  Neck flexor muscle endurance test: Negative, Upper limb tension test (ULTT): Positive, and Spurling's test: Negative   FUNCTIONAL TESTS:  30 seconds chair stand test  TREATMENT DATE:  [facility] Adult PT Treatment:  DATE: 05/27/23 Therapeutic Exercise: Nustep L4 8 min Manual Therapy: Skilled palpation to identify taught and irritable bands in B UT Manual levator stretch 30s x2 B Trigger Point Dry Needling  Initial Treatment: Pt instructed on Dry Needling rational, procedures, and possible side effects. Pt instructed to expect mild to moderate muscle soreness later in the day and/or into the next day.  Pt instructed in methods to reduce muscle soreness. Pt instructed to continue prescribed HEP. Patient was educated on signs and symptoms of infection and other risk factors and advised to seek medical attention should they occur.  Patient verbalized understanding of these instructions and education.   Patient Verbal Consent Given: Yes Education Handout Provided: Yes Muscles Treated: B UT Electrical Stimulation Performed: No Treatment Response/Outcome: decreased TTP, twitch response elicited    Neuromuscular re-ed: Supine hor abd RTB 10x B, 10/10 unilterally Supine OH flexion 2# 10/10 S/L open book with breathing patterns 10/10  [facility] Adult PT Treatment:                                                DATE: 05/22/23 Therapeutic Exercise: Nustep L4 8 min Neuromuscular re-ed: Supine hor abd YTB 15x B, 15/15 unilterally Supine OH flexion 1# 15/15 S/L open book with breathing patterns 10/10 Hip tosses, shoulder tosses, chops and Victories, 10 reps with 0# weighted ball  Therapeutic Activity: Seated hor abd YTB  15x Seated ER YTB 15x Seated thoracic extension over 1/2 roll Prone on elbows 2 min  [facility] Adult PT Treatment:                                                DATE: 05/12/23 Eval and HEP Self Care: Additional minutes spent for educating on updated Therapeutic Home Exercise Program as well as comparing current status to condition at start of symptoms. This included exercises focusing on stretching, strengthening, with focus on eccentric aspects. Long term goals include an improvement in range of motion, strength, endurance as well as avoiding reinjury. Patient's frequency would include in 1-2 times a day, 3-5 times a week for a duration of 6-12 weeks. Proper technique shown and discussed handout in great detail. All questions were discussed and addressed.                                                                                                                                PATIENT EDUCATION:  Education details: Discussed eval findings, rehab rationale and POC and patient is in agreement  Person educated: Patient Education method: Explanation Education comprehension: verbalized understanding and needs further education  HOME EXERCISE PROGRAM: Access Code: [email or web address] URL: [email or web address] Date: 05/12/2023 Prepared by: Gustavus Bryant  Exercises -  Standing Shoulder Horizontal Abduction with Resistance  - 1 x daily - 5 x weekly - 2 sets - 15 reps - Shoulder External Rotation and Scapular Retraction with Resistance  - 1 x daily - 5 x weekly - 2 sets - 15 reps - Doorway Pec Stretch at 90 Degrees Abduction  - 1 x daily - 5 x weekly - 1 sets - 3 reps - 30s hold  ASSESSMENT:  CLINICAL IMPRESSION: Continues to note relief following sessions.  Agreeable to DN today.  Added resistance to tasks but decreased reps accordingly.  Did note some levels of soreness post needling but did not limit ability to participate in therapy tasks.  Patient is a 33 y.o. female who was seen today  for physical therapy evaluation and treatment for chronic neck and upper back pain releated to mammary hypertrophy.  She presents with full UE AROM and strength, deep neck flexor endurance test and Spurling's are negative.  Cervical AROM limited by soft tissue restrictions especially in R scalene group.  ULTT test positive on R due to soft tissue restrictions. TrPs detected in B UT and levators,   OBJECTIVE IMPAIRMENTS: decreased activity tolerance, decreased endurance, decreased ROM, increased fascial restrictions, impaired perceived functional ability, increased muscle spasms, impaired UE functional use, postural dysfunction, and pain.   ACTIVITY LIMITATIONS: carrying, lifting, sitting, and reach over head  PERSONAL FACTORS: Fitness and Time since onset of injury/illness/exacerbation are also affecting patient's functional outcome.   REHAB POTENTIAL: Good  CLINICAL DECISION MAKING: Stable/uncomplicated  EVALUATION COMPLEXITY: Low   GOALS: Goals reviewed with patient? No  SHORT TERM GOALS=LONG TERM GOALS: Target date: 06/22/2023    Patient to demonstrate independence in HEP  Baseline: [email or web address] Goal status: INITIAL  2.  Patient will acknowledge 4/10 pain at least once during episode of care   Baseline: 6/10 Goal status: INITIAL  3.  Patient will score at least 67% on PSFS to signify clinically meaningful improvement in functional abilities.   Baseline: 57% Goal status: INITIAL  4.  Increase AROM cervical spine to 90% Baseline:  Active ROM A/PROM (deg) eval  Flexion 90%P!  Extension 50%P!  Right lateral flexion 90%  Left lateral flexion 75%  Right rotation 90%  Left rotation 75%   Goal status: INITIAL   PLAN:  PT FREQUENCY: 1x/week  PT DURATION: 6 weeks  PLANNED INTERVENTIONS: 97164- PT Re-evaluation, 97110-Therapeutic exercises, 97530- Therapeutic activity, 97112- Neuromuscular re-education, 97535- Self Care, 16109- Manual therapy, Patient/Family education, Dry  Needling, Joint mobilization, and Spinal mobilization  PLAN FOR NEXT SESSION: HEP review and update, manual techniques as appropriate, aerobic tasks, ROM and flexibility activities, strengthening and PREs, TPDN, gait and balance training as needed     Eldon Greenland, PT 05/27/2023, 1:02 PM

## 2023-05-27 ENCOUNTER — Ambulatory Visit

## 2023-05-27 DIAGNOSIS — S29019D Strain of muscle and tendon of unspecified wall of thorax, subsequent encounter: Secondary | ICD-10-CM

## 2023-05-27 DIAGNOSIS — M542 Cervicalgia: Secondary | ICD-10-CM | POA: Diagnosis not present

## 2023-05-27 DIAGNOSIS — R293 Abnormal posture: Secondary | ICD-10-CM | POA: Diagnosis not present

## 2023-05-27 DIAGNOSIS — N62 Hypertrophy of breast: Secondary | ICD-10-CM | POA: Diagnosis not present

## 2023-06-03 ENCOUNTER — Encounter: Payer: Self-pay | Admitting: Family Medicine

## 2023-06-04 ENCOUNTER — Other Ambulatory Visit (HOSPITAL_COMMUNITY): Payer: Self-pay

## 2023-06-04 ENCOUNTER — Encounter (HOSPITAL_COMMUNITY): Payer: Self-pay

## 2023-06-04 NOTE — Therapy (Unsigned)
 OUTPATIENT PHYSICAL THERAPY TREATMENT NOTE   Patient Name: Raven [name] MRN: [id number] DOB:Jun 12, 1990, 33 y.o., female Today's Date: 06/05/2023  END OF SESSION:  PT End of Session - 06/05/23 0837     Visit Number 4    Number of Visits 6    Date for PT Re-Evaluation 07/12/23    Authorization Type [place] AETNA    PT Start Time 0837    PT Stop Time 0915    PT Time Calculation (min) 38 min    Activity Tolerance Patient tolerated treatment well    Behavior During Therapy [facility] for tasks assessed/performed                Past Medical History:  Diagnosis Date   Autoimmune disease (HCC) 07/25/2020   Complication of anesthesia    Elevated liver enzymes    Endometriosis    Medical history non-contributory    Otosclerosis    PONV (postoperative nausea and vomiting)    SVT (supraventricular tachycardia) (HCC)    Tachycardia    Past Surgical History:  Procedure Laterality Date   CESAREAN SECTION N/A 06/14/2014   Procedure: CESAREAN SECTION;  Surgeon: Matt Song, MD;  Location: [facility];  Service: Obstetrics;  Laterality: N/A;   CESAREAN SECTION WITH BILATERAL TUBAL LIGATION Bilateral 01/30/2022   Procedure: REPEAT CESAREAN SECTION WITH BILATERAL TUBAL LIGATION EDC: 02-05-22 ALLERG: HYDROXYCHLOROQUINE , SULFA, WALNUT  PREVIOUS X 1;  Surgeon: Dyanna Glasgow, DO;  Location: [facility];  Service: Obstetrics;  Laterality: Bilateral;   EXCISION MASS ABDOMINAL N/A 10/21/2019   Procedure: EXCISION MASS ABDOMINAL;  Surgeon: Adalberto Acton, MD;  Location: [facility];  Service: General;  Laterality: N/A;   stebactomy     Patient Active Problem List   Diagnosis Date Noted   Dupuytren's contracture of right hand 08/20/2022   Patellar tendonitis of right knee 08/20/2022   Pain in left foot 08/20/2022   Symptomatic mammary hypertrophy 07/09/2022   Back pain 07/09/2022   Preeclampsia in postpartum period 02/11/2022   Pre-eclampsia in postpartum period 02/11/2022   Previous  cesarean section 01/30/2022   S/P cesarean section 01/30/2022   Morbid obesity (HCC) 09/28/2020   Rash and other nonspecific skin eruption 09/10/2020   Positive ANA (antinuclear antibody) 07/25/2020   High risk medication use 07/25/2020   Autoimmune disease (HCC) 07/25/2020   Pain of right thumb 06/12/2020   Adverse reaction to food, subsequent encounter 05/11/2020   Pain of breast 12/02/2019   Abdominal mass 07/22/2019   Multiple joint pain 03/22/2019   Bilateral hearing loss 11/01/2018   Well adult exam 11/01/2018   Endometriosis 10/12/2018   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   Otosclerosis 09/03/2018   Cesarean delivery delivered 06/15/2014    PCP: Eliodoro Guerin, DO   REFERRING PROVIDER: Harden Leyden, PA-C   REFERRING DIAG: N62 (ICD-10-CM) - Symptomatic mammary hypertrophy   THERAPY DIAG:  Cervicalgia  Abnormal posture  Thoracic myofascial strain, subsequent encounter  Rationale for Evaluation and Treatment: Rehabilitation  ONSET DATE: chronic  SUBJECTIVE:  SUBJECTIVE STATEMENT: DN helped, symptoms less.  Agreeable to repeat procedure Hand dominance: Right  PERTINENT HISTORY:  non  PAIN:  Are you having pain? Yes: NPRS scale: 8/10 Pain location: cervical and upper back Pain description: ache  Aggravating factors: OH reaching, running Relieving factors: stretching and gravity eliminated positions  PRECAUTIONS: None  RED FLAGS: None    WEIGHT BEARING RESTRICTIONS: No  FALLS:  Has patient fallen in last 6 months? No  OCCUPATION: Nurse  PLOF: Independent  PATIENT GOALS: To manage my  NEXT MD VISIT: TBD  OBJECTIVE:  Note: Objective measures were completed at Evaluation  unless otherwise noted.  DIAGNOSTIC FINDINGS:  none  PATIENT SURVEYS:  Patient-specific activity scoring scheme (Point to one number):  "0" represents "unable to perform." "10" represents "able to perform at prior level. 0 1 2 3 4 5 6 7 8 9  10 (Date and Score) Activity Initial  Activity Eval     Computer work   5    running  6    Overhead work 6    Additional Additional Total score = sum of the activity scores/number of activities Minimum detectable change (90%CI) for average score = 2 points Minimum detectable change (90%CI) for single activity score = 3 points PSFS developed by: [place] Spitz., & Binkley, J. (1995). Assessing disability and change on individual  patients: a report of a patient specific measure. Physiotherapy [place], 47, [phone]. Reproduced with the permission of the authors  Score: 17/30  POSTURE: rounded shoulders, forward head, and increased thoracic kyphosis  PALPATION: TTP B UT   CERVICAL ROM:   Active ROM A/PROM (deg) eval  Flexion 90%P!  Extension 50%P!  Right lateral flexion 90%  Left lateral flexion 75%  Right rotation 90%  Left rotation 75%   (Blank rows = not tested)  UPPER EXTREMITY ROM: WNL  Active ROM Right eval Left eval  Shoulder flexion    Shoulder extension    Shoulder abduction    Shoulder adduction    Shoulder extension    Shoulder internal rotation    Shoulder external rotation    Elbow flexion    Elbow extension    Wrist flexion    Wrist extension    Wrist ulnar deviation    Wrist radial deviation    Wrist pronation    Wrist supination     (Blank rows = not tested)  UPPER EXTREMITY MMT: WNL  MMT Right eval Left eval  Shoulder flexion    Shoulder extension    Shoulder abduction    Shoulder adduction    Shoulder extension    Shoulder internal rotation    Shoulder external rotation    Middle trapezius    Lower trapezius    Elbow flexion    Elbow extension    Wrist flexion     Wrist extension    Wrist ulnar deviation    Wrist radial deviation    Wrist pronation    Wrist supination    Grip strength     (Blank rows = not tested)  CERVICAL SPECIAL TESTS:  Neck flexor muscle endurance test: Negative, Upper limb tension test (ULTT): Positive, and Spurling's test: Negative   FUNCTIONAL TESTS:  30 seconds chair stand test  TREATMENT DATE:  [facility] Adult PT Treatment:  DATE: 06/05/23 Therapeutic Exercise: Nustep L4 8 min Manual Therapy: Skilled palpation to identify taught and irritable bands in B UT Manual levator stretch 30s x2 B Trigger Point Dry Needling  Subsequent Treatment: Instructions provided previously at initial dry needling treatment.   Patient Verbal Consent Given: Yes Education Handout Provided: Previously Provided Muscles Treated: B UT Electrical Stimulation Performed: No Treatment Response/Outcome: pressure reported initially f/b a decrease in tissue tension    Therapeutic Activity: Omega high row 15# 15x Omega low row 15# 15x Omega lat pulldown 15# 15x  [facility] Adult PT Treatment:                                                DATE: 05/27/23 Therapeutic Exercise: Nustep L4 8 min Manual Therapy: Skilled palpation to identify taught and irritable bands in B UT Manual levator stretch 30s x2 B Trigger Point Dry Needling  Initial Treatment: Pt instructed on Dry Needling rational, procedures, and possible side effects. Pt instructed to expect mild to moderate muscle soreness later in the day and/or into the next day.  Pt instructed in methods to reduce muscle soreness. Pt instructed to continue prescribed HEP. Patient was educated on signs and symptoms of infection and other risk factors and advised to seek medical attention should they occur.  Patient verbalized understanding of these instructions and education.   Patient Verbal Consent Given: Yes Education Handout Provided: Yes Muscles  Treated: B UT Electrical Stimulation Performed: No Treatment Response/Outcome: decreased TTP, twitch response elicited    Neuromuscular re-ed: Supine hor abd RTB 10x B, 10/10 unilterally Supine OH flexion 2# 10/10 S/L open book with breathing patterns 10/10 Open book against wall 10/10  [facility] Adult PT Treatment:                                                DATE: 05/22/23 Therapeutic Exercise: Nustep L4 8 min Neuromuscular re-ed: Supine hor abd YTB 15x B, 15/15 unilterally Supine OH flexion 1# 15/15 S/L open book with breathing patterns 10/10 Hip tosses, shoulder tosses, chops and Victories, 10 reps with 0# weighted ball  Therapeutic Activity: Seated hor abd YTB 15x Seated ER YTB 15x Seated thoracic extension over 1/2 roll Prone on elbows 2 min  [facility] Adult PT Treatment:                                                DATE: 05/12/23 Eval and HEP Self Care: Additional minutes spent for educating on updated Therapeutic Home Exercise Program as well as comparing current status to condition at start of symptoms. This included exercises focusing on stretching, strengthening, with focus on eccentric aspects. Long term goals include an improvement in range of motion, strength, endurance as well as avoiding reinjury. Patient's frequency would include in 1-2 times a day, 3-5 times a week for a duration of 6-12 weeks. Proper technique shown and discussed handout in great detail. All questions were discussed and addressed.  PATIENT EDUCATION:  Education details: Discussed eval findings, rehab rationale and POC and patient is in agreement  Person educated: Patient Education method: Explanation Education comprehension: verbalized understanding and needs further education  HOME EXERCISE PROGRAM: Access Code: [email or web address] URL: [email or web address] Date:  05/12/2023 Prepared by: Gretta Leavens  Exercises - Standing Shoulder Horizontal Abduction with Resistance  - 1 x daily - 5 x weekly - 2 sets - 15 reps - Shoulder External Rotation and Scapular Retraction with Resistance  - 1 x daily - 5 x weekly - 2 sets - 15 reps - Doorway Pec Stretch at 90 Degrees Abduction  - 1 x daily - 5 x weekly - 1 sets - 3 reps - 30s hold  ASSESSMENT:  CLINICAL IMPRESSION: Continued DN f/b stretching, strengthening and postural retraining activities.  Less tissue tension noted following DN but todays response was less dramatic than previous session.  Advanced to isotonic strengthening and standing open book.   Patient is a 33 y.o. female who was seen today for physical therapy evaluation and treatment for chronic neck and upper back pain releated to mammary hypertrophy.  She presents with full UE AROM and strength, deep neck flexor endurance test and Spurling's are negative.  Cervical AROM limited by soft tissue restrictions especially in R scalene group.  ULTT test positive on R due to soft tissue restrictions. TrPs detected in B UT and levators,   OBJECTIVE IMPAIRMENTS: decreased activity tolerance, decreased endurance, decreased ROM, increased fascial restrictions, impaired perceived functional ability, increased muscle spasms, impaired UE functional use, postural dysfunction, and pain.   ACTIVITY LIMITATIONS: carrying, lifting, sitting, and reach over head  PERSONAL FACTORS: Fitness and Time since onset of injury/illness/exacerbation are also affecting patient's functional outcome.   REHAB POTENTIAL: Good  CLINICAL DECISION MAKING: Stable/uncomplicated  EVALUATION COMPLEXITY: Low   GOALS: Goals reviewed with patient? No  SHORT TERM GOALS=LONG TERM GOALS: Target date: 06/22/2023    Patient to demonstrate independence in HEP  Baseline: [email or web address] Goal status: INITIAL  2.  Patient will acknowledge 4/10 pain at least once during episode of care   Baseline:  6/10 Goal status: INITIAL  3.  Patient will score at least 67% on PSFS to signify clinically meaningful improvement in functional abilities.   Baseline: 57% Goal status: INITIAL  4.  Increase AROM cervical spine to 90% Baseline:  Active ROM A/PROM (deg) eval  Flexion 90%P!  Extension 50%P!  Right lateral flexion 90%  Left lateral flexion 75%  Right rotation 90%  Left rotation 75%   Goal status: INITIAL   PLAN:  PT FREQUENCY: 1x/week  PT DURATION: 6 weeks  PLANNED INTERVENTIONS: 97164- PT Re-evaluation, 97110-Therapeutic exercises, 97530- Therapeutic activity, 97112- Neuromuscular re-education, 97535- Self Care, [id number]- Manual therapy, Patient/Family education, Dry Needling, Joint mobilization, and Spinal mobilization  PLAN FOR NEXT SESSION: HEP review and update, manual techniques as appropriate, aerobic tasks, ROM and flexibility activities, strengthening and PREs, TPDN, gait and balance training as needed     Eldon Greenland, PT 06/05/2023, 9:15 AM

## 2023-06-05 ENCOUNTER — Ambulatory Visit

## 2023-06-05 DIAGNOSIS — S29019D Strain of muscle and tendon of unspecified wall of thorax, subsequent encounter: Secondary | ICD-10-CM | POA: Diagnosis not present

## 2023-06-05 DIAGNOSIS — R293 Abnormal posture: Secondary | ICD-10-CM

## 2023-06-05 DIAGNOSIS — M542 Cervicalgia: Secondary | ICD-10-CM

## 2023-06-05 DIAGNOSIS — N62 Hypertrophy of breast: Secondary | ICD-10-CM | POA: Diagnosis not present

## 2023-06-08 NOTE — Therapy (Unsigned)
 OUTPATIENT PHYSICAL THERAPY TREATMENT NOTE   Patient Name: Raven [name] MRN: [id number] DOB:May 25, 1990, 33 y.o., female Today's Date: 06/09/2023  END OF SESSION:  PT End of Session - 06/09/23 1744     Visit Number 5    Number of Visits 6    Date for PT Re-Evaluation 07/12/23    Authorization Type [place] AETNA    PT Start Time 1745    PT Stop Time 1825    PT Time Calculation (min) 40 min    Activity Tolerance Patient tolerated treatment well    Behavior During Therapy [facility] for tasks assessed/performed                 Past Medical History:  Diagnosis Date   Autoimmune disease (HCC) 07/25/2020   Complication of anesthesia    Elevated liver enzymes    Endometriosis    Medical history non-contributory    Otosclerosis    PONV (postoperative nausea and vomiting)    SVT (supraventricular tachycardia) (HCC)    Tachycardia    Past Surgical History:  Procedure Laterality Date   CESAREAN SECTION N/A 06/14/2014   Procedure: CESAREAN SECTION;  Surgeon: Matt Song, MD;  Location: [facility];  Service: Obstetrics;  Laterality: N/A;   CESAREAN SECTION WITH BILATERAL TUBAL LIGATION Bilateral 01/30/2022   Procedure: REPEAT CESAREAN SECTION WITH BILATERAL TUBAL LIGATION EDC: 02-05-22 ALLERG: HYDROXYCHLOROQUINE , SULFA, WALNUT  PREVIOUS X 1;  Surgeon: Dyanna Glasgow, DO;  Location: [facility];  Service: Obstetrics;  Laterality: Bilateral;   EXCISION MASS ABDOMINAL N/A 10/21/2019   Procedure: EXCISION MASS ABDOMINAL;  Surgeon: Adalberto Acton, MD;  Location: [facility];  Service: General;  Laterality: N/A;   stebactomy     Patient Active Problem List   Diagnosis Date Noted   Dupuytren's contracture of right hand 08/20/2022   Patellar tendonitis of right knee 08/20/2022   Pain in left foot 08/20/2022   Symptomatic mammary hypertrophy 07/09/2022   Back pain 07/09/2022   Preeclampsia in postpartum period 02/11/2022   Pre-eclampsia in postpartum period 02/11/2022   Previous  cesarean section 01/30/2022   S/P cesarean section 01/30/2022   Morbid obesity (HCC) 09/28/2020   Rash and other nonspecific skin eruption 09/10/2020   Positive ANA (antinuclear antibody) 07/25/2020   High risk medication use 07/25/2020   Autoimmune disease (HCC) 07/25/2020   Pain of right thumb 06/12/2020   Adverse reaction to food, subsequent encounter 05/11/2020   Pain of breast 12/02/2019   Abdominal mass 07/22/2019   Multiple joint pain 03/22/2019   Bilateral hearing loss 11/01/2018   Well adult exam 11/01/2018   Endometriosis 10/12/2018   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   Otosclerosis 09/03/2018   Cesarean delivery delivered 06/15/2014    PCP: Eliodoro Guerin, DO   REFERRING PROVIDER: Harden Leyden, PA-C   REFERRING DIAG: N62 (ICD-10-CM) - Symptomatic mammary hypertrophy   THERAPY DIAG:  Cervicalgia  Abnormal posture  Thoracic myofascial strain, subsequent encounter  Rationale for Evaluation and Treatment: Rehabilitation  ONSET DATE: chronic  SUBJECTIVE:  SUBJECTIVE STATEMENT: Feels better after PT sessions but symptoms eventually return Hand dominance: Right  PERTINENT HISTORY:  non  PAIN:  Are you having pain? Yes: NPRS scale: 8/10 Pain location: cervical and upper back Pain description: ache  Aggravating factors: OH reaching, running Relieving factors: stretching and gravity eliminated positions  PRECAUTIONS: None  RED FLAGS: None    WEIGHT BEARING RESTRICTIONS: No  FALLS:  Has patient fallen in last 6 months? No  OCCUPATION: Nurse  PLOF: Independent  PATIENT GOALS: To manage my  NEXT MD VISIT: TBD  OBJECTIVE:  Note: Objective measures were completed at  Evaluation unless otherwise noted.  DIAGNOSTIC FINDINGS:  none  PATIENT SURVEYS:  Patient-specific activity scoring scheme (Point to one number):  "0" represents "unable to perform." "10" represents "able to perform at prior level. 0 1 2 3 4 5 6 7 8 9  10 (Date and Score) Activity Initial  Activity Eval     Computer work   5    running  6    Overhead work 6    Additional Additional Total score = sum of the activity scores/number of activities Minimum detectable change (90%CI) for average score = 2 points Minimum detectable change (90%CI) for single activity score = 3 points PSFS developed by: Dorrine Gaudy, M., & Binkley, J. (1995). Assessing disability and change on individual  patients: a report of a patient specific measure. Physiotherapy [place], 47, [phone]. Reproduced with the permission of the authors  Score: 17/30  POSTURE: rounded shoulders, forward head, and increased thoracic kyphosis  PALPATION: TTP B UT   CERVICAL ROM:   Active ROM A/PROM (deg) eval  Flexion 90%P!  Extension 50%P!  Right lateral flexion 90%  Left lateral flexion 75%  Right rotation 90%  Left rotation 75%   (Blank rows = not tested)  UPPER EXTREMITY ROM: WNL  Active ROM Right eval Left eval  Shoulder flexion    Shoulder extension    Shoulder abduction    Shoulder adduction    Shoulder extension    Shoulder internal rotation    Shoulder external rotation    Elbow flexion    Elbow extension    Wrist flexion    Wrist extension    Wrist ulnar deviation    Wrist radial deviation    Wrist pronation    Wrist supination     (Blank rows = not tested)  UPPER EXTREMITY MMT: WNL  MMT Right eval Left eval  Shoulder flexion    Shoulder extension    Shoulder abduction    Shoulder adduction    Shoulder extension    Shoulder internal rotation    Shoulder external rotation    Middle trapezius    Lower trapezius    Elbow flexion    Elbow extension    Wrist  flexion    Wrist extension    Wrist ulnar deviation    Wrist radial deviation    Wrist pronation    Wrist supination    Grip strength     (Blank rows = not tested)  CERVICAL SPECIAL TESTS:  Neck flexor muscle endurance test: Negative, Upper limb tension test (ULTT): Positive, and Spurling's test: Negative   FUNCTIONAL TESTS:  30 seconds chair stand test  TREATMENT DATE:  [facility] Adult PT Treatment:  DATE: 06/09/23 Therapeutic Exercise: Nustep L5 8 min Manual Therapy: Skilled palpation to identify taught and irritable bands in B UT Trigger Point Dry Needling  Subsequent Treatment: Instructions reviewed, if requested by the patient, prior to subsequent dry needling treatment.   Patient Verbal Consent Given: Yes Education Handout Provided: Previously Provided Muscles Treated: B UT Electrical Stimulation Performed: No Treatment Response/Outcome: Twitch response on R, pressure on L   Therapeutic Activity: Omega high row 20# 15x Omega low row 20# 15x Omega lat pulldown 20# 15x Open book 15/15 Quadruped cat/camel 10 Bird dog 10/10  [facility] Adult PT Treatment:                                                DATE: 06/05/23 Therapeutic Exercise: Nustep L4 8 min Manual Therapy: Skilled palpation to identify taught and irritable bands in B UT Manual levator stretch 30s x2 B Trigger Point Dry Needling  Subsequent Treatment: Instructions provided previously at initial dry needling treatment.   Patient Verbal Consent Given: Yes Education Handout Provided: Previously Provided Muscles Treated: B UT Electrical Stimulation Performed: No Treatment Response/Outcome: pressure reported initially f/b a decrease in tissue tension    Therapeutic Activity: Omega high row 15# 15x Omega low row 15# 15x Omega lat pulldown 15# 15x  [facility] Adult PT Treatment:                                                DATE: 05/27/23 Therapeutic Exercise: Nustep L4  8 min Manual Therapy: Skilled palpation to identify taught and irritable bands in B UT Manual levator stretch 30s x2 B Trigger Point Dry Needling  Initial Treatment: Pt instructed on Dry Needling rational, procedures, and possible side effects. Pt instructed to expect mild to moderate muscle soreness later in the day and/or into the next day.  Pt instructed in methods to reduce muscle soreness. Pt instructed to continue prescribed HEP. Patient was educated on signs and symptoms of infection and other risk factors and advised to seek medical attention should they occur.  Patient verbalized understanding of these instructions and education.   Patient Verbal Consent Given: Yes Education Handout Provided: Yes Muscles Treated: B UT Electrical Stimulation Performed: No Treatment Response/Outcome: decreased TTP, twitch response elicited    Neuromuscular re-ed: Supine hor abd RTB 10x B, 10/10 unilterally Supine OH flexion 2# 10/10 S/L open book with breathing patterns 10/10 Open book against wall 10/10  [facility] Adult PT Treatment:                                                DATE: 05/22/23 Therapeutic Exercise: Nustep L4 8 min Neuromuscular re-ed: Supine hor abd YTB 15x B, 15/15 unilterally Supine OH flexion 1# 15/15 S/L open book with breathing patterns 10/10 Hip tosses, shoulder tosses, chops and Victories, 10 reps with 0# weighted ball  Therapeutic Activity: Seated hor abd YTB 15x Seated ER YTB 15x Seated thoracic extension over 1/2 roll Prone on elbows 2 min  [facility] Adult PT Treatment:  DATE: 05/12/23 Eval and HEP Self Care: Additional minutes spent for educating on updated Therapeutic Home Exercise Program as well as comparing current status to condition at start of symptoms. This included exercises focusing on stretching, strengthening, with focus on eccentric aspects. Long term goals include an improvement in range of motion, strength,  endurance as well as avoiding reinjury. Patient's frequency would include in 1-2 times a day, 3-5 times a week for a duration of 6-12 weeks. Proper technique shown and discussed handout in great detail. All questions were discussed and addressed.                                                                                                                                PATIENT EDUCATION:  Education details: Discussed eval findings, rehab rationale and POC and patient is in agreement  Person educated: Patient Education method: Explanation Education comprehension: verbalized understanding and needs further education  HOME EXERCISE PROGRAM: Access Code: [email or web address] URL: [email or web address] Date: 05/12/2023 Prepared by: Gretta Leavens  Exercises - Standing Shoulder Horizontal Abduction with Resistance  - 1 x daily - 5 x weekly - 2 sets - 15 reps - Shoulder External Rotation and Scapular Retraction with Resistance  - 1 x daily - 5 x weekly - 2 sets - 15 reps - Doorway Pec Stretch at 90 Degrees Abduction  - 1 x daily - 5 x weekly - 1 sets - 3 reps - 30s hold  ASSESSMENT:  CLINICAL IMPRESSION: Focus of session was continued DN to relieve tissue tension/discomfort.  Added resistance as noted and introduced spinal mobility tasks to promote movement and function.  Patient is a 33 y.o. female who was seen today for physical therapy evaluation and treatment for chronic neck and upper back pain releated to mammary hypertrophy.  She presents with full UE AROM and strength, deep neck flexor endurance test and Spurling's are negative.  Cervical AROM limited by soft tissue restrictions especially in R scalene group.  ULTT test positive on R due to soft tissue restrictions. TrPs detected in B UT and levators,   OBJECTIVE IMPAIRMENTS: decreased activity tolerance, decreased endurance, decreased ROM, increased fascial restrictions, impaired perceived functional ability, increased muscle  spasms, impaired UE functional use, postural dysfunction, and pain.   ACTIVITY LIMITATIONS: carrying, lifting, sitting, and reach over head  PERSONAL FACTORS: Fitness and Time since onset of injury/illness/exacerbation are also affecting patient's functional outcome.   REHAB POTENTIAL: Good  CLINICAL DECISION MAKING: Stable/uncomplicated  EVALUATION COMPLEXITY: Low   GOALS: Goals reviewed with patient? No  SHORT TERM GOALS=LONG TERM GOALS: Target date: 06/22/2023    Patient to demonstrate independence in HEP  Baseline: [email or web address] Goal status: INITIAL  2.  Patient will acknowledge 4/10 pain at least once during episode of care   Baseline: 6/10 Goal status: INITIAL  3.  Patient will score at least 67% on PSFS to signify clinically meaningful improvement in functional abilities.   Baseline: 57% Goal status: INITIAL  4.  Increase AROM cervical spine to 90% Baseline:  Active ROM A/PROM (deg) eval  Flexion 90%P!  Extension 50%P!  Right lateral flexion 90%  Left lateral flexion 75%  Right rotation 90%  Left rotation 75%   Goal status: INITIAL   PLAN:  PT FREQUENCY: 1x/week  PT DURATION: 6 weeks  PLANNED INTERVENTIONS: 97164- PT Re-evaluation, 97110-Therapeutic exercises, 97530- Therapeutic activity, 97112- Neuromuscular re-education, 97535- Self Care, [id number]- Manual therapy, Patient/Family education, Dry Needling, Joint mobilization, and Spinal mobilization  PLAN FOR NEXT SESSION: HEP review and update, manual techniques as appropriate, aerobic tasks, ROM and flexibility activities, strengthening and PREs, TPDN, gait and balance training as needed     Eldon Greenland, PT 06/09/2023, 6:26 PM

## 2023-06-09 ENCOUNTER — Ambulatory Visit

## 2023-06-09 DIAGNOSIS — M542 Cervicalgia: Secondary | ICD-10-CM

## 2023-06-09 DIAGNOSIS — S29019D Strain of muscle and tendon of unspecified wall of thorax, subsequent encounter: Secondary | ICD-10-CM

## 2023-06-09 DIAGNOSIS — R293 Abnormal posture: Secondary | ICD-10-CM

## 2023-06-09 DIAGNOSIS — N62 Hypertrophy of breast: Secondary | ICD-10-CM | POA: Diagnosis not present

## 2023-06-16 ENCOUNTER — Ambulatory Visit: Attending: Student | Admitting: Physical Therapy

## 2023-06-16 DIAGNOSIS — S29019D Strain of muscle and tendon of unspecified wall of thorax, subsequent encounter: Secondary | ICD-10-CM | POA: Diagnosis not present

## 2023-06-16 DIAGNOSIS — M542 Cervicalgia: Secondary | ICD-10-CM | POA: Diagnosis not present

## 2023-06-16 DIAGNOSIS — R293 Abnormal posture: Secondary | ICD-10-CM | POA: Diagnosis not present

## 2023-06-16 NOTE — Therapy (Addendum)
 OUTPATIENT PHYSICAL THERAPY TREATMENT NOTE/DISCHARGE SUMMARY   Patient Name: Raven [name] MRN: [id number] DOB:1990-11-29, 33 y.o., female Today's Date: 06/16/2023  END OF SESSION:  PT End of Session - 06/16/23 1019     Visit Number 6    Number of Visits 6    Date for PT Re-Evaluation 07/12/23    Authorization Type [place] [employer]ET[employer]    PT Start Time 1018    PT Stop Time 1044    PT Time Calculation (min) 26 min                 Past Medical History:  Diagnosis Date   Autoimmune disease (HCC) 07/25/2020   Complication of anesthesia    Elevated liver enzymes    Endometriosis    Medical history non-contributory    Otosclerosis    PONV (postoperative nausea and vomiting)    SVT (supraventricular tachycardia) (HCC)    Tachycardia    Past Surgical History:  Procedure Laterality Date   CESAREAN SECTION N/A 06/14/2014   Procedure: CESAREAN SECTION;  Surgeon: Matt Song, MD;  Location: [facility];  Service: Obstetrics;  Laterality: N/A;   CESAREAN SECTION WITH BILATERAL TUBAL LIGATION Bilateral 01/30/2022   Procedure: REPEAT CESAREAN SECTION WITH BILATERAL TUBAL LIGATION EDC: 02-05-22 ALLERG: HYDROXYCHLOROQUINE , SULFA, WALNUT  PREVIOUS X 1;  Surgeon: Dyanna Glasgow, DO;  Location: [facility];  Service: Obstetrics;  Laterality: Bilateral;   EXCISION MASS ABDOMINAL N/A 10/21/2019   Procedure: EXCISION MASS ABDOMINAL;  Surgeon: Adalberto Acton, MD;  Location: [facility];  Service: General;  Laterality: N/A;   stebactomy     Patient Active Problem List   Diagnosis Date Noted   Dupuytren's contracture of right hand 08/20/2022   Patellar tendonitis of right knee 08/20/2022   Pain in left foot 08/20/2022   Symptomatic mammary hypertrophy 07/09/2022   Back pain 07/09/2022   Preeclampsia in postpartum period 02/11/2022   Pre-eclampsia in postpartum period 02/11/2022   Previous cesarean section 01/30/2022   S/P cesarean section 01/30/2022   Morbid obesity (HCC) 09/28/2020    Rash and other nonspecific skin eruption 09/10/2020   Positive ANA (antinuclear antibody) 07/25/2020   High risk medication use 07/25/2020   Autoimmune disease (HCC) 07/25/2020   Pain of right thumb 06/12/2020   Adverse reaction to food, subsequent encounter 05/11/2020   Pain of breast 12/02/2019   Abdominal mass 07/22/2019   Multiple joint pain 03/22/2019   Bilateral hearing loss 11/01/2018   Well adult exam 11/01/2018   Endometriosis 10/12/2018   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   Otosclerosis 09/03/2018   Cesarean delivery delivered 06/15/2014    PCP: Eliodoro Guerin, DO   REFERRING PROVIDER: Harden Leyden, PA-C   REFERRING DIAG: N62 (ICD-10-CM) - Symptomatic mammary hypertrophy   THERAPY DIAG:  Cervicalgia  Abnormal posture  Thoracic myofascial strain, subsequent encounter  Rationale for Evaluation and Treatment: Rehabilitation  ONSET DATE: chronic  SUBJECTIVE:  SUBJECTIVE STATEMENT: Had a birthday party for child this weekend and had a lot of pain and difficulty with lifting and carrying. Pain increased to 9/10 in upper back. Dry needling provides short term relief only.   Hand dominance: Right  PERTINENT HISTORY:  non  PAIN:  Are you having pain? Yes: NPRS scale: 7/10 Pain location: cervical and upper back Pain description: ache  Aggravating factors: OH reaching, running Relieving factors: stretching and gravity eliminated positions  PRECAUTIONS: None  RED FLAGS: None    WEIGHT BEARING RESTRICTIONS: No  FALLS:  Has patient fallen in last 6 months? No  OCCUPATION: Nurse  PLOF: Independent  PATIENT GOALS: To manage my  NEXT MD VISIT: TBD  OBJECTIVE:  Note: Objective  measures were completed at Evaluation unless otherwise noted.  DIAGNOSTIC FINDINGS:  none  PATIENT SURVEYS:  Patient-specific activity scoring scheme (Point to one number):  "0" represents "unable to perform." "10" represents "able to perform at prior level. 0 1 2 3 4 5 6 7 8 9  10 (Date and Score) Activity Initial  Activity Eval  06/16/23   Computer work   5  5  running  6  6  Overhead work 6 6   Additional Additional Total score = sum of the activity scores/number of activities Minimum detectable change (90%CI) for average score = 2 points Minimum detectable change (90%CI) for single activity score = 3 points PSFS developed by: Dorrine Gaudy, M., & Binkley, J. (1995). Assessing disability and change on individual  patients: a report of a patient specific measure. Physiotherapy [place], 47, [phone]. Reproduced with the permission of the authors  Score: 17/30 06/16/23: 17/30  POSTURE: rounded shoulders, forward head, and increased thoracic kyphosis  PALPATION: TTP B UT   CERVICAL ROM:   Active ROM A/PROM (deg) eval AROM 06/16/23  Flexion 90%P! 90% pull  Extension 50%P! 50%   Right lateral flexion 90% 90%   Left lateral flexion 75% 80%  Right rotation 90% 90%   Left rotation 75% 80% pull   (Blank rows = not tested)  UPPER EXTREMITY ROM: WNL  Active ROM Right eval Left eval  Shoulder flexion    Shoulder extension    Shoulder abduction    Shoulder adduction    Shoulder extension    Shoulder internal rotation    Shoulder external rotation    Elbow flexion    Elbow extension    Wrist flexion    Wrist extension    Wrist ulnar deviation    Wrist radial deviation    Wrist pronation    Wrist supination     (Blank rows = not tested)  UPPER EXTREMITY MMT: WNL  MMT Right eval Left eval  Shoulder flexion    Shoulder extension    Shoulder abduction    Shoulder adduction    Shoulder extension    Shoulder internal rotation    Shoulder  external rotation    Middle trapezius    Lower trapezius    Elbow flexion    Elbow extension    Wrist flexion    Wrist extension    Wrist ulnar deviation    Wrist radial deviation    Wrist pronation    Wrist supination    Grip strength     (Blank rows = not tested)  CERVICAL SPECIAL TESTS:  Neck flexor muscle endurance test: Negative, Upper limb tension test (ULTT): Positive, and Spurling's test: Negative   FUNCTIONAL TESTS:  30 seconds chair stand test  TREATMENT  [facility] Adult PT Treatment:                                                DATE: 06/16/23 Therapeutic Exercise: Review of HEP Open books  Therapeutic Activity: Omega high row 20# 15x Omega low row 25# 15x Omega lat pulldown 20# 15x Goal check/ PSFS update     [facility] Adult PT Treatment:                                                DATE: 06/09/23 Therapeutic Exercise: Nustep L5 8 min Manual Therapy: Skilled palpation to identify taught and irritable bands in B UT Trigger Point Dry Needling  Subsequent Treatment: Instructions reviewed, if requested by the patient, prior to subsequent dry needling treatment.   Patient Verbal Consent Given: Yes Education Handout Provided: Previously Provided Muscles Treated: B UT Electrical Stimulation Performed: No Treatment Response/Outcome: Twitch response on R, pressure on L   Therapeutic Activity: Omega high row 20# 15x Omega low row 20# 15x Omega lat pulldown 20# 15x Open book 15/15 Quadruped cat/camel 10 Bird dog 10/10  [facility] Adult PT Treatment:                                                DATE: 06/05/23 Therapeutic Exercise: Nustep L4 8 min Manual Therapy: Skilled palpation to identify taught and irritable bands in B UT Manual levator stretch 30s x2 B Trigger Point Dry Needling  Subsequent Treatment: Instructions provided previously at initial dry needling treatment.   Patient Verbal Consent Given: Yes Education Handout Provided: Previously  Provided Muscles Treated: B UT Electrical Stimulation Performed: No Treatment Response/Outcome: pressure reported initially f/b a decrease in tissue tension    Therapeutic Activity: Omega high row 15# 15x Omega low row 15# 15x Omega lat pulldown 15# 15x  [facility] Adult PT Treatment:                                                DATE: 05/27/23 Therapeutic Exercise: Nustep L4 8 min Manual Therapy: Skilled palpation to identify taught and irritable bands in B UT Manual levator stretch 30s x2 B Trigger Point Dry Needling  Initial Treatment: Pt instructed on Dry Needling rational, procedures, and possible side effects. Pt instructed to expect mild to moderate muscle soreness later in the day and/or into the next day.  Pt instructed in methods to reduce muscle soreness. Pt instructed to continue prescribed HEP. Patient was educated on signs and symptoms of infection and other risk factors and advised to seek medical attention should they occur.  Patient verbalized understanding of these instructions and education.   Patient Verbal Consent Given: Yes Education Handout Provided: Yes Muscles Treated: B UT Electrical Stimulation Performed: No Treatment Response/Outcome: decreased TTP, twitch response elicited    Neuromuscular re-ed: Supine hor abd RTB 10x B, 10/10 unilterally Supine OH flexion 2# 10/10 S/L open book with breathing patterns 10/10 Open book against wall 10/10  [facility] Adult PT Treatment:                                                DATE: 05/22/23 Therapeutic Exercise: Nustep L4 8 min Neuromuscular re-ed: Supine hor abd YTB 15x B, 15/15 unilterally Supine OH flexion 1# 15/15 S/L open book with breathing patterns 10/10 Hip tosses, shoulder tosses, chops and Victories, 10 reps with 0# weighted ball  Therapeutic Activity: Seated hor abd YTB 15x Seated ER YTB 15x Seated thoracic extension over 1/2 roll Prone on elbows 2 min  [facility] Adult PT Treatment:                                                 DATE: 05/12/23 Eval and HEP Self Care: Additional minutes spent for educating on updated Therapeutic Home Exercise Program as well as comparing current status to condition at start of symptoms. This included exercises focusing on stretching, strengthening, with focus on eccentric aspects. Long term goals include an improvement in range of motion, strength, endurance as well as avoiding reinjury. Patient's frequency would include in 1-2 times a day, 3-5 times a week for a duration of 6-12 weeks. Proper technique shown and discussed handout in great detail. All questions were discussed and addressed.                                                                                                                                PATIENT EDUCATION:  Education details: Discussed eval findings, rehab rationale and POC and patient is in agreement  Person educated: Patient Education method: Explanation Education comprehension: verbalized understanding and needs further education  HOME EXERCISE PROGRAM: Access Code: [email or web address] URL: [email or web address] Date: 05/12/2023 Prepared by: Gretta Leavens  Exercises - Standing Shoulder Horizontal Abduction with Resistance  - 1 x daily - 5 x weekly - 2 sets - 15 reps - Shoulder External Rotation and Scapular Retraction with Resistance  - 1 x daily - 5 x weekly - 2 sets - 15 reps - Doorway Pec Stretch at 90 Degrees Abduction  - 1 x daily - 5 x weekly - 1 sets - 3 reps - 30s hold  ASSESSMENT:  CLINICAL IMPRESSION: Pt arrives for last treatment in POC. At this time, she reports no overall change in pain or function. She did receive short term relief with TPDN and feels her neck and and upper traps are softer. Slight improvement in neck AROM since start of POC. She continues to have significant difficulty with any lifting, carrying, or prolonged time on feet. No change in PSFS score. Pt will f/u with MD May 15th regarding possible  breast reduction surgery.    Patient is  a 33 y.o. female who was seen today for physical therapy evaluation and treatment for chronic neck and upper back pain releated to mammary hypertrophy.  She presents with full UE AROM and strength, deep neck flexor endurance test and Spurling's are negative.  Cervical AROM limited by soft tissue restrictions especially in R scalene group.  ULTT test positive on R due to soft tissue restrictions. TrPs detected in B UT and levators,   OBJECTIVE IMPAIRMENTS: decreased activity tolerance, decreased endurance, decreased ROM, increased fascial restrictions, impaired perceived functional ability, increased muscle spasms, impaired UE functional use, postural dysfunction, and pain.   ACTIVITY LIMITATIONS: carrying, lifting, sitting, and reach over head  PERSONAL FACTORS: Fitness and Time since onset of injury/illness/exacerbation are also affecting patient's functional outcome.   REHAB POTENTIAL: Good  CLINICAL DECISION MAKING: Stable/uncomplicated  EVALUATION COMPLEXITY: Low   GOALS: Goals reviewed with patient? No  SHORT TERM GOALS=LONG TERM GOALS: Target date: 06/22/2023    Patient to demonstrate independence in HEP  Baseline: [email or web address] 06/16/23: able to recall Goal status: MET  2.  Patient will acknowledge 4/10 pain at least once during episode of care   Baseline: 6/10 06/16/23: 4/10 or higher-pressure  Goal status: PARTIALLY MET  3.  Patient will score at least 67% on PSFS to signify clinically meaningful improvement in functional abilities.   Baseline: 57% 06/16/23: 57%  Goal status: NOT MET  4.  Increase AROM cervical spine to 90% Baseline:  Active ROM A/PROM (deg) eval AROM 06/16/23  Flexion 90%P! 90% pull  Extension 50%P! 50%  Right lateral flexion 90% 90%  Left lateral flexion 75% 80%  Right rotation 90% 90%  Left rotation 75% 80%   Goal status: NOT MET   PLAN:  PT FREQUENCY: 1x/week  PT DURATION: 6 weeks  PLANNED  INTERVENTIONS: 97164- PT Re-evaluation, 97110-Therapeutic exercises, 97530- Therapeutic activity, 97112- Neuromuscular re-education, 97535- Self Care, 86578- Manual therapy, Patient/Family education, Dry Needling, Joint mobilization, and Spinal mobilization  PLAN FOR NEXT SESSION: N/A Dc to HEP   Gasper Karst, PTA 06/16/23 10:45 AM Phone: [phone] Fax: [phone]

## 2023-06-18 ENCOUNTER — Encounter: Payer: Self-pay | Admitting: Family Medicine

## 2023-06-25 ENCOUNTER — Ambulatory Visit: Admitting: Student

## 2023-06-25 DIAGNOSIS — N62 Hypertrophy of breast: Secondary | ICD-10-CM

## 2023-06-25 NOTE — Progress Notes (Addendum)
     Referring Provider Eliodoro Guerin, DO [address] [place],  [place] [address]   CC: PT follow up     Raven [name] is an 33 y.o. female.  HPI: Patient is a 33 y.o. year old female here for follow up after completing physical therapy for pain related to macromastia.   She was seen for initial consult by Dr. Orin Birk on 07/09/2022.  At that time, patient complained of upper back and neck pain due to her enlarged breast.  On exam, her BMI was 41 kg/m.  Her weight was 239 pounds.  Her preoperative bra size was a G cup.  The excess amount of breast tissue to be removed at the time of surgery was 1000 g bilaterally.  The patient expressed the desire to move forward with surgical intervention.  Patient was found to be a good candidate for bilateral breast reduction, but patient would need to lose a little bit more weight.  Patient was actively trying to decrease her weight.  Physical therapy was also ordered for the patient.  Goal was for patient to decrease her weight by 10 to 15 pounds.   Patient was then seen in the office on 04/27/2023.  At this visit, patient reported she had lost 15 pounds, but had not undergone physical therapy.  Recommended that patient undergo physical therapy and we would follow-up after that.  Today, patient reports she is doing well.  She said that she completed 6 sessions of physical therapy.  She reports that she found a little bit of relief from the physical therapy, but was still experiencing pulling pain near her neck and shoulders.  Patient denies any recent fevers, chills or changes in her health.  She states that she would still like to move forward with breast reduction.   Review of Systems General: Denies any recent fevers, chills or changes in her health  Physical Exam Patient was speaking in full and clear sentences  Assessment/Plan  Patient is interested in pursuing surgical intervention for bilateral breast reduction. Patient has completed  at least 6 weeks of physical therapy for pain related to macromastia.  Discussed with patient we would submit to insurance for authorization, discussed approval could take up to 6 weeks.   The patient gave consent to have this visit done by telemedicine / virtual visit, two identifiers were used to identify patient. This is also consent for access the chart and treat the patient via this visit. The patient is located in [place] .  I, the provider, am at the office.  We spent 5 minutes together for the visit.  Joined by telephone.   Harden Leyden 06/25/2023, 1:39 PM

## 2023-06-25 NOTE — Addendum Note (Signed)
 Addended by: Lamount Pimple on: 06/25/2023 01:40 PM   Modules accepted: Orders

## 2023-06-30 ENCOUNTER — Telehealth: Admitting: Physician Assistant

## 2023-06-30 ENCOUNTER — Other Ambulatory Visit (HOSPITAL_COMMUNITY): Payer: Self-pay

## 2023-06-30 DIAGNOSIS — J208 Acute bronchitis due to other specified organisms: Secondary | ICD-10-CM | POA: Diagnosis not present

## 2023-06-30 DIAGNOSIS — B9689 Other specified bacterial agents as the cause of diseases classified elsewhere: Secondary | ICD-10-CM

## 2023-06-30 MED ORDER — AZITHROMYCIN 250 MG PO TABS
ORAL_TABLET | ORAL | 0 refills | Status: AC
  Filled 2023-06-30: qty 6, 5d supply, fill #0

## 2023-06-30 MED ORDER — BENZONATATE 100 MG PO CAPS
100.0000 mg | ORAL_CAPSULE | Freq: Three times a day (TID) | ORAL | 0 refills | Status: DC | PRN
  Filled 2023-06-30: qty 30, 10d supply, fill #0

## 2023-06-30 NOTE — Progress Notes (Signed)
 I have spent 5 minutes in review of e-visit questionnaire, review and updating patient chart, medical decision making and response to patient.   Piedad Climes, PA-C

## 2023-06-30 NOTE — Progress Notes (Signed)

## 2023-08-04 NOTE — Progress Notes (Unsigned)
 Patient ID: Raven [name], female    DOB: 04-09-1990, 33 y.o.   MRN: [id number]  No chief complaint on file.   No diagnosis found.   History of Present Illness: Raven [name] is a 33 y.o.  female  with a history of macromastia.  She presents for preoperative evaluation for upcoming procedure, Bilateral Breast Reduction with liposuction, scheduled for 08/20/2023 with Dr.  Lowery  The patient {HAS HAS [id number]:[id number]} had problems with anesthesia. ***  Summary of Previous Visit: ***  Estimated excess breast tissue to be removed at time of surgery: *** grams  Job: ***  PMH Significant for: ***   Past Medical History: Allergies: Allergies  Allergen Reactions   Macrobid [Nitrofurantoin] Hives   Other     Walnuts - burns tongue    Hydroxychloroquine  Rash and Hives   Sulfamethoxazole-Trimethoprim Rash    Current Medications:  Current Outpatient Medications:    benzonatate  (TESSALON ) 100 MG capsule, Take 1 capsule (100 mg total) by mouth 3 (three) times daily as needed for cough., Disp: 30 capsule, Rfl: 0   levonorgestrel  (MIRENA , 52 MG,) 20 MCG/DAY IUD, , Disp: , Rfl:    levothyroxine  (SYNTHROID ) 50 MCG tablet, Take 1 tablet (50 mcg total) by mouth daily before breakfast., Disp: 90 tablet, Rfl: 3   norethindrone  (AYGESTIN ) 5 MG tablet, Take 2 tablets (10 mg total) by mouth daily., Disp: 60 tablet, Rfl: 3   Semaglutide , 2 MG/DOSE, 8 MG/3ML SOPN, Inject 2 mg into the skin once a week. Compounding pharmacy from [place].  Advance dose as tolerated, Disp: 9 mL, Rfl: 0   sertraline  (ZOLOFT ) 100 MG tablet, Take 1 tablet (100 mg total) by mouth daily., Disp: 90 tablet, Rfl: 3  Past Medical Problems: Past Medical History:  Diagnosis Date   Autoimmune disease (HCC) 07/25/2020   Complication of anesthesia    Elevated liver enzymes    Endometriosis    Medical history non-contributory    Otosclerosis    PONV (postoperative nausea and vomiting)    SVT (supraventricular  tachycardia) (HCC)    Tachycardia     Past Surgical History: Past Surgical History:  Procedure Laterality Date   CESAREAN SECTION N/A 06/14/2014   Procedure: CESAREAN SECTION;  Surgeon: Rosaline Luna, MD;  Location: [facility];  Service: Obstetrics;  Laterality: N/A;   CESAREAN SECTION WITH BILATERAL TUBAL LIGATION Bilateral 01/30/2022   Procedure: REPEAT CESAREAN SECTION WITH BILATERAL TUBAL LIGATION EDC: 02-05-22 ALLERG: HYDROXYCHLOROQUINE , SULFA, WALNUT  PREVIOUS X 1;  Surgeon: Dannielle Bouchard, DO;  Location: [facility];  Service: Obstetrics;  Laterality: Bilateral;   EXCISION MASS ABDOMINAL N/A 10/21/2019   Procedure: EXCISION MASS ABDOMINAL;  Surgeon: Signe Mitzie LABOR, MD;  Location: [facility];  Service: General;  Laterality: N/A;   stebactomy      Social History: Social History   Socioeconomic History   Marital status: Married    Spouse name: Not on file   Number of children: Not on file   Years of education: Not on file   Highest education level: Bachelor's degree (e.g., BA, AB, BS)  Occupational History   Not on file  Tobacco Use   Smoking status: Never    Passive exposure: Never   Smokeless tobacco: Never  Vaping Use   Vaping status: Never Used  Substance and Sexual Activity   Alcohol use: No   Drug use: No   Sexual activity: Not Currently  Other Topics Concern   Not on file  Social History Narrative  Not on file   Social Drivers of Health   Financial Resource Strain: Low Risk  (05/19/2022)   Overall Financial Resource Strain (CARDIA)    Difficulty of Paying Living Expenses: Not hard at all  Food Insecurity: No Food Insecurity (05/19/2022)   Hunger Vital Sign    Worried About Running Out of Food in the Last Year: Never true    Ran Out of Food in the Last Year: Never true  Transportation Needs: No Transportation Needs (05/19/2022)   PRAPARE - Administrator, Civil Service (Medical): No    Lack of Transportation (Non-Medical): No  Physical Activity:  Sufficiently Active (05/19/2022)   Exercise Vital Sign    Days of Exercise per Week: 5 days    Minutes of Exercise per Session: 30 min  Stress: No Stress Concern Present (05/19/2022)   [employer] of Occupational Health - Occupational Stress Questionnaire    Feeling of Stress : Not at all  Social Connections: Socially Integrated (05/19/2022)   Social Connection and Isolation Panel    Frequency of Communication with Friends and Family: More than three times a week    Frequency of Social Gatherings with Friends and Family: Once a week    Attends Religious Services: More than 4 times per year    Active Member of [employer] or Organizations: Yes    Attends Engineer, structural: More than 4 times per year    Marital Status: Married  Catering manager Violence: Not At Risk (02/12/2022)   Humiliation, Afraid, Rape, and Kick questionnaire    Fear of Current or Ex-Partner: No    Emotionally Abused: No    Physically Abused: No    Sexually Abused: No    Family History: Family History  Problem Relation Age of Onset   Other Mother        Elevated liver enzymes   Diabetes Mother    Hypertension Mother    Thyroid  disease Mother    Diabetes Father    Hypertension Father    Hyperlipidemia Father    Other Brother        Elevated liver enzymes   Asthma Paternal Aunt    Asthma Paternal Grandmother    Healthy Son    Allergic rhinitis Neg Hx    Eczema Neg Hx    Urticaria Neg Hx     Review of Systems: ROS  Physical Exam: Vital Signs There were no vitals taken for this visit.  Physical Exam *** Constitutional:      General: Not in acute distress.    Appearance: Normal appearance. Not ill-appearing.  HENT:     Head: Normocephalic and atraumatic.  Eyes:     Pupils: Pupils are equal, round Neck:     Musculoskeletal: Normal range of motion.  Cardiovascular:     Rate and Rhythm: Normal rate    Pulses: Normal pulses.  Pulmonary:     Effort: Pulmonary effort is normal. No  respiratory distress.  Musculoskeletal: Normal range of motion.  Skin:    General: Skin is warm and dry.     Findings: No erythema or rash.  Neurological:     General: No focal deficit present.     Mental Status: Alert and oriented to person, place, and time. Mental status is at baseline.     Motor: No weakness.  Psychiatric:        Mood and Affect: Mood normal.        Behavior: Behavior normal.    Assessment/Plan: The patient  is scheduled for bilateral breast reduction with Dr. {Aojwx:[id number]::Ujbonm,Ipoopwhyjf}.  Risks, benefits, and alternatives of procedure discussed, questions answered and consent obtained.    Smoking Status: ***; Counseling Given? *** Last Mammogram: ***; Results: ***  Caprini Score: ***; Risk Factors include: ***, BMI *** 25, and length of planned surgery. Recommendation for mechanical *** pharmacological prophylaxis. Encourage early ambulation.   Pictures obtained: @consult ***  Post-op Rx sent to pharmacy: {Blank:[id number]::Oxycodone , Zofran , Keflex,Oxycodone , Zofran }  Patient was provided with the breast reduction and General Surgical Risk consent document and Pain Medication Agreement prior to their appointment.  They had adequate time to read through the risk consent documents and Pain Medication Agreement. We also discussed them in person together during this preop appointment. All of their questions were answered to their satisfaction.  Recommended calling if they have any further questions.  Risk consent form and Pain Medication Agreement to be scanned into patient's chart.  The risk that can be encountered with breast reduction were discussed and include the following but not limited to these:  Breast asymmetry, fluid accumulation, firmness of the breast, inability to breast feed, loss of nipple or areola, skin loss, decrease or no nipple sensation, fat necrosis of the breast tissue, bleeding, infection, healing delay.  There are risks of anesthesia,  changes to skin sensation and injury to nerves or blood vessels.  The muscle can be temporarily or permanently injured.  You may have an allergic reaction to tape, suture, glue, blood products which can result in skin discoloration, swelling, pain, skin lesions, poor healing.  Any of these can lead to the need for revisonal surgery or stage procedures.  A reduction has potential to interfere with diagnostic procedures.  Nipple or breast piercing can increase risks of infection.  This procedure is best done when the breast is fully developed.  Changes in the breast will continue to occur over time.  Pregnancy can alter the outcomes of previous breast reduction surgery, weight gain and weigh loss can also effect the long term appearance.     Electronically signed by: Donnice PARAS Bricyn Labrada, PA-C 08/04/2023 3:41 PM

## 2023-08-05 ENCOUNTER — Encounter: Payer: Self-pay | Admitting: Surgical

## 2023-08-05 ENCOUNTER — Ambulatory Visit (INDEPENDENT_AMBULATORY_CARE_PROVIDER_SITE_OTHER): Admitting: Surgical

## 2023-08-05 VITALS — BP 139/85 | HR 96 | Wt 232.4 lb

## 2023-08-05 DIAGNOSIS — G8929 Other chronic pain: Secondary | ICD-10-CM

## 2023-08-05 DIAGNOSIS — N62 Hypertrophy of breast: Secondary | ICD-10-CM

## 2023-08-05 DIAGNOSIS — M546 Pain in thoracic spine: Secondary | ICD-10-CM

## 2023-08-05 DIAGNOSIS — Z6839 Body mass index (BMI) 39.0-39.9, adult: Secondary | ICD-10-CM

## 2023-08-10 ENCOUNTER — Other Ambulatory Visit: Payer: Self-pay

## 2023-08-20 ENCOUNTER — Encounter (HOSPITAL_BASED_OUTPATIENT_CLINIC_OR_DEPARTMENT_OTHER): Admission: RE | Payer: Self-pay | Source: Home / Self Care

## 2023-08-20 ENCOUNTER — Ambulatory Visit (HOSPITAL_BASED_OUTPATIENT_CLINIC_OR_DEPARTMENT_OTHER): Admission: RE | Admit: 2023-08-20 | Source: Home / Self Care | Admitting: Plastic Surgery

## 2023-08-20 SURGERY — BREAST REDUCTION WITH LIPOSUCTION
Anesthesia: Choice | Site: Breast | Laterality: Bilateral

## 2023-08-28 ENCOUNTER — Encounter: Admitting: Plastic Surgery

## 2023-08-28 ENCOUNTER — Encounter: Payer: Commercial Managed Care - PPO | Admitting: Family Medicine

## 2023-09-01 ENCOUNTER — Other Ambulatory Visit: Payer: Self-pay

## 2023-09-01 ENCOUNTER — Telehealth: Admitting: Physician Assistant

## 2023-09-01 ENCOUNTER — Ambulatory Visit
Admission: RE | Admit: 2023-09-01 | Discharge: 2023-09-01 | Disposition: A | Discharge status: Home / Self Care | Attending: Physician Assistant | Admitting: Physician Assistant

## 2023-09-01 ENCOUNTER — Other Ambulatory Visit (HOSPITAL_COMMUNITY): Payer: Self-pay

## 2023-09-01 VITALS — BP 138/94 | HR 99 | Temp 98.1°F | Resp 19 | Ht 64.0 in | Wt 245.0 lb

## 2023-09-01 DIAGNOSIS — K148 Other diseases of tongue: Secondary | ICD-10-CM

## 2023-09-01 DIAGNOSIS — K1370 Unspecified lesions of oral mucosa: Secondary | ICD-10-CM | POA: Diagnosis not present

## 2023-09-01 MED ORDER — LIDOCAINE VISCOUS HCL 2 % MT SOLN
5.0000 mL | Freq: Four times a day (QID) | OROMUCOSAL | 1 refills | Status: DC | PRN
  Filled 2023-09-01: qty 180, 9d supply, fill #0

## 2023-09-01 NOTE — ED Provider Notes (Signed)
 [facility]    CSN: [id number] Arrival date & time: 09/01/23  1649      History   Chief Complaint Chief Complaint  Patient presents with   Mouth Lesions    Entered by patient    HPI Raven [name] is a 33 y.o. female.   HPI  Pt reports concerns for oral lesions - she states she has what she thinks is an ulcer in her mouth  She reports concerns for a lesion on her tongue as well. She states her dentist took a look at it but did not note anything needing further work up. She states the tongue lesion is painful but it does not seem to be getting bigger She states it has a white area on it that it usually doesn't have She states she is having pain and discomfort with chewing and swallowing largely due to the tongue lesion and the lesion on the left cheek.   She also reports concerns for arm heaviness, fatigue and and generalized body aches since the weekend She states over the weekend she was tasked with taking care of a child and she was alone which may be accounting for this    Past Medical History:  Diagnosis Date   Autoimmune disease (HCC) 07/25/2020   Complication of anesthesia    Elevated liver enzymes    Endometriosis    Medical history non-contributory    Otosclerosis    PONV (postoperative nausea and vomiting)    SVT (supraventricular tachycardia) (HCC)    Tachycardia     Patient Active Problem List   Diagnosis Date Noted   Dupuytren's contracture of right hand 08/20/2022   Patellar tendonitis of right knee 08/20/2022   Pain in left foot 08/20/2022   Symptomatic mammary hypertrophy 07/09/2022   Back pain 07/09/2022   Preeclampsia in postpartum period 02/11/2022   Pre-eclampsia in postpartum period 02/11/2022   Previous cesarean section 01/30/2022   S/P cesarean section 01/30/2022   Morbid obesity (HCC) 09/28/2020   Rash and other nonspecific skin eruption 09/10/2020   Positive ANA (antinuclear antibody) 07/25/2020   High risk medication  use 07/25/2020   Autoimmune disease (HCC) 07/25/2020   Pain of right thumb 06/12/2020   Adverse reaction to food, subsequent encounter 05/11/2020   Pain of breast 12/02/2019   Abdominal mass 07/22/2019   Multiple joint pain 03/22/2019   Bilateral hearing loss 11/01/2018   Well adult exam 11/01/2018   Endometriosis 10/12/2018   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   Otosclerosis 09/03/2018   Cesarean delivery delivered 06/15/2014    Past Surgical History:  Procedure Laterality Date   CESAREAN SECTION N/A 06/14/2014   Procedure: CESAREAN SECTION;  Surgeon: Rosaline Luna, MD;  Location: [facility];  Service: Obstetrics;  Laterality: N/A;   CESAREAN SECTION WITH BILATERAL TUBAL LIGATION Bilateral 01/30/2022   Procedure: REPEAT CESAREAN SECTION WITH BILATERAL TUBAL LIGATION EDC: 02-05-22 ALLERG: HYDROXYCHLOROQUINE , SULFA, WALNUT  PREVIOUS X 1;  Surgeon: Dannielle Bouchard, DO;  Location: [facility];  Service: Obstetrics;  Laterality: Bilateral;   EXCISION MASS ABDOMINAL N/A 10/21/2019   Procedure: EXCISION MASS ABDOMINAL;  Surgeon: Signe Mitzie LABOR, MD;  Location: [facility];  Service: General;  Laterality: N/A;   stebactomy      OB History     Gravida  2   Para  2   Term  2   Preterm      AB  Living  2      SAB      IAB      Ectopic      Multiple  0   Live Births  2            Home Medications    Prior to Admission medications   Medication Sig Start Date End Date Taking? Authorizing Provider  magic mouthwash (nystatin , lidocaine , diphenhydrAMINE , alum & mag hydroxide) suspension Swish and swallow 5 mLs 4 (four) times daily as needed for mouth pain. 09/01/23  Yes Braniya Farrugia E, PA-C  dexamethasone  0.5 MG/5ML elixir Take 5 mLs (0.5 mg total) by mouth 2 (two) times daily for 14 days. 09/04/23 09/18/23    levonorgestrel  (MIRENA , 52 MG,) 20 MCG/DAY IUD  08/17/14   [provider]  levothyroxine   (SYNTHROID ) 50 MCG tablet Take 1 tablet (50 mcg total) by mouth daily before breakfast. 08/28/22   Jolinda Potter M, DO  sertraline  (ZOLOFT ) 100 MG tablet Take 1 tablet (100 mg total) by mouth daily. 01/12/23   Jolinda Potter HERO, DO  triamcinolone  (KENALOG ) 0.1 % paste Use as directed 1 Application to oral ulcers 2 (two) times daily for 14 days. 09/04/23       Family History Family History  Problem Relation Age of Onset   Other Mother        Elevated liver enzymes   Diabetes Mother    Hypertension Mother    Thyroid  disease Mother    Diabetes Father    Hypertension Father    Hyperlipidemia Father    Other Brother        Elevated liver enzymes   Asthma Paternal Aunt    Asthma Paternal Grandmother    Healthy Son    Allergic rhinitis Neg Hx    Eczema Neg Hx    Urticaria Neg Hx     Social History Social History   Tobacco Use   Smoking status: Never    Passive exposure: Never   Smokeless tobacco: Never  Vaping Use   Vaping status: Never Used  Substance Use Topics   Alcohol use: No   Drug use: No     Allergies   Macrobid [nitrofurantoin], Other, Hydroxychloroquine , and Sulfamethoxazole-trimethoprim   Review of Systems Review of Systems  Constitutional:  Positive for fatigue.  HENT:  Positive for mouth sores and sore throat.   Musculoskeletal:  Positive for myalgias.     Physical Exam Triage Vital Signs ED Triage Vitals  Encounter Vitals Group     BP 09/01/23 1708 (!) 138/94     Girls Systolic BP Percentile --      Girls Diastolic BP Percentile --      Boys Systolic BP Percentile --      Boys Diastolic BP Percentile --      Pulse Rate 09/01/23 1708 99     Resp 09/01/23 1708 19     Temp 09/01/23 1708 98.1 F (36.7 C)     Temp Source 09/01/23 1708 Oral     SpO2 09/01/23 1708 97 %     Weight 09/01/23 1710 245 lb (111.1 kg)     Height 09/01/23 1710 5' 4 (1.626 m)     Head Circumference --      Peak Flow --      Pain Score 09/01/23 1710 7     Pain Loc --       Pain Education --      Exclude from Growth Chart --    No data  found.  Updated Vital Signs BP (!) 138/94 (BP Location: Right Arm)   Pulse 99   Temp 98.1 F (36.7 C) (Oral)   Resp 19   Ht 5' 4 (1.626 m)   Wt 245 lb (111.1 kg)   SpO2 97%   Breastfeeding No   BMI 42.05 kg/m   Visual Acuity Right Eye Distance:   Left Eye Distance:   Bilateral Distance:    Right Eye Near:   Left Eye Near:    Bilateral Near:     Physical Exam Vitals reviewed.  Constitutional:      General: She is awake.     Appearance: Normal appearance. She is well-developed and well-groomed.  HENT:     Head: Normocephalic and atraumatic.     Mouth/Throat:     Lips: Pink.     Mouth: Mucous membranes are moist. Oral lesions (ulcerated lesion along the side of the left cheek) present.     Tongue: Lesions present.     Pharynx: Oropharynx is clear. Uvula midline. No pharyngeal swelling, oropharyngeal exudate, posterior oropharyngeal erythema or postnasal drip.   Eyes:     General: Lids are normal. Gaze aligned appropriately.     Extraocular Movements: Extraocular movements intact.     Conjunctiva/sclera: Conjunctivae normal.  Pulmonary:     Effort: Pulmonary effort is normal.  Lymphadenopathy:     Head:     Right side of head: No submental, submandibular or preauricular adenopathy.     Left side of head: No submental, submandibular or preauricular adenopathy.     Cervical: No cervical adenopathy.     Right cervical: No superficial cervical adenopathy.    Left cervical: No superficial cervical adenopathy.  Neurological:     Mental Status: She is alert and oriented to person, place, and time.  Psychiatric:        Attention and Perception: Attention and perception normal.        Mood and Affect: Mood and affect normal.        Speech: Speech normal.        Behavior: Behavior normal. Behavior is cooperative.      UC Treatments / Results  Labs (all labs ordered are listed, but only abnormal  results are displayed) Labs Reviewed - No data to display  EKG   Radiology No results found.  Procedures Procedures (including critical care time)  Medications Ordered in UC Medications - No data to display  Initial Impression / Assessment and Plan / UC Course  I have reviewed the triage vital signs and the nursing notes.  Pertinent labs & imaging results that were available during my care of the patient were reviewed by me and considered in my medical decision making (see chart for details).      Final Clinical Impressions(s) / UC Diagnoses   Final diagnoses:  Oral mucosal lesion  Lesion of tongue   Pt presents today with concerns for a lesion on the tongue and oral lesions on the sides of the cheeks. Physical exam reveals papular lesion of the tongue that is approx 5-7 mm in diameter and raised from the surface. She also has several ulcerations along the buccal mucosa which may be consistent with aphthous ulcers or accidental bite. Will send in script for magic mouth wash to assist with pain and discomfort. Recommend follow up with ENT for further evaluation and potential biopsy of tongue lesion as she states this is becoming a nuisance. Follow up as needed for persistent or progressing symptoms  Discharge Instructions   None    ED Prescriptions     Medication Sig Dispense Auth. Provider   magic mouthwash (nystatin , lidocaine , diphenhydrAMINE , alum & mag hydroxide) suspension Swish and swallow 5 mLs 4 (four) times daily as needed for mouth pain. 180 mL Shaka Cardin E, PA-C      PDMP not reviewed this encounter.   Marylene Rocky BRAVO, PA-C 09/06/23 [id number]

## 2023-09-01 NOTE — ED Triage Notes (Signed)
 Pt presents with a chief complaint of mouth lesions x 3 days. States that the left side of tongue is swollen. Pain is intense, currently rates an 8/10. Also mentions sore throat (with difficulty swallowing), generalized body aches, and feeling overall fatigued. OTC Tylenol  + Ibuprofen  taken with relief. Denies fevers at home.

## 2023-09-01 NOTE — Progress Notes (Signed)
  Because of the growth on the tongue with evidence of inflammation and ulceration at the top of it and need to rule out cancerous lesion, I feel your condition warrants further evaluation and I recommend that you be seen in a face-to-face visit.   NOTE: There will be NO CHARGE for this E-Visit   If you are having a true medical emergency, please call 911.     For an urgent face to face visit, [facility] has multiple urgent care centers for your convenience.  Click the link below for the full list of locations and hours, walk-in wait times, appointment scheduling options and driving directions:  Urgent Care - [place], [place], [place], [place], [place], [place]  [place]     Your MyChart E-visit questionnaire answers were reviewed by a board certified advanced clinical practitioner to complete your personal care plan based on your specific symptoms.    Thank you for using e-Visits.

## 2023-09-02 ENCOUNTER — Other Ambulatory Visit (HOSPITAL_COMMUNITY): Payer: Self-pay

## 2023-09-03 ENCOUNTER — Other Ambulatory Visit (HOSPITAL_COMMUNITY): Payer: Self-pay

## 2023-09-03 ENCOUNTER — Telehealth: Payer: Self-pay | Admitting: Internal Medicine

## 2023-09-03 DIAGNOSIS — K1379 Other lesions of oral mucosa: Secondary | ICD-10-CM | POA: Diagnosis not present

## 2023-09-03 DIAGNOSIS — K148 Other diseases of tongue: Secondary | ICD-10-CM | POA: Diagnosis not present

## 2023-09-03 NOTE — Telephone Encounter (Signed)
 Patient called stating she just had an appointment with Dr. Maggie, ENT regarding mouth ulcers.  Patient states she has been experiencing joint pain, fatigue, and now mouth ulcers.  Patient states Dr. Vandegriend recommended that she contact Dr. Jeannetta to either schedule a follow-up appointment while she is still experiencing her flair-up.

## 2023-09-04 ENCOUNTER — Other Ambulatory Visit (HOSPITAL_COMMUNITY): Payer: Self-pay

## 2023-09-04 ENCOUNTER — Telehealth: Payer: Self-pay | Admitting: Internal Medicine

## 2023-09-04 MED ORDER — DEXAMETHASONE 0.5 MG/5ML PO ELIX
0.5000 mg | ORAL_SOLUTION | Freq: Two times a day (BID) | ORAL | 0 refills | Status: AC
  Filled 2023-09-04 (×2): qty 140, 14d supply, fill #0

## 2023-09-04 MED ORDER — TRIAMCINOLONE ACETONIDE 0.1 % MT PSTE
1.0000 | PASTE | Freq: Two times a day (BID) | OROMUCOSAL | 2 refills | Status: DC
  Filled 2023-09-04: qty 5, 14d supply, fill #0

## 2023-09-04 NOTE — Telephone Encounter (Signed)
 Pt called again to see if Dr. Jeannetta has seen her message.

## 2023-09-07 ENCOUNTER — Other Ambulatory Visit (HOSPITAL_COMMUNITY): Payer: Self-pay

## 2023-09-07 ENCOUNTER — Encounter: Payer: Self-pay | Admitting: Family Medicine

## 2023-09-07 DIAGNOSIS — R768 Other specified abnormal immunological findings in serum: Secondary | ICD-10-CM

## 2023-09-07 NOTE — Telephone Encounter (Signed)
 Patient contacted the office again. Patient states the ENT did give her steroids but she has not taken them. Patient states she is having new symptoms. Patient states she is very tired, has bilateral arm weakness, joint flare ups, and mouth sores/ulcers. Patient inquires if she could get lab work done to try to get some answers, patient states she was suspicious of Lupus. Please advise.

## 2023-09-08 ENCOUNTER — Encounter: Admitting: Surgical

## 2023-09-09 DIAGNOSIS — R768 Other specified abnormal immunological findings in serum: Secondary | ICD-10-CM | POA: Diagnosis not present

## 2023-09-09 DIAGNOSIS — M2550 Pain in unspecified joint: Secondary | ICD-10-CM | POA: Diagnosis not present

## 2023-09-14 ENCOUNTER — Other Ambulatory Visit (HOSPITAL_COMMUNITY): Payer: Self-pay

## 2023-09-15 ENCOUNTER — Other Ambulatory Visit (HOSPITAL_COMMUNITY): Payer: Self-pay | Admitting: General Surgery

## 2023-09-21 ENCOUNTER — Encounter (INDEPENDENT_AMBULATORY_CARE_PROVIDER_SITE_OTHER): Payer: Self-pay | Admitting: Family Medicine

## 2023-09-21 ENCOUNTER — Other Ambulatory Visit (HOSPITAL_COMMUNITY): Payer: Self-pay

## 2023-09-21 DIAGNOSIS — L700 Acne vulgaris: Secondary | ICD-10-CM

## 2023-09-21 MED ORDER — TRETINOIN 0.025 % EX CREA
TOPICAL_CREAM | Freq: Every day | CUTANEOUS | 4 refills | Status: AC
  Filled 2023-09-21 – 2024-02-12 (×2): qty 45, 90d supply, fill #0

## 2023-09-21 NOTE — Telephone Encounter (Signed)

## 2023-09-22 ENCOUNTER — Encounter: Admitting: Surgical

## 2023-09-25 ENCOUNTER — Other Ambulatory Visit: Payer: Self-pay

## 2023-09-25 ENCOUNTER — Other Ambulatory Visit (HOSPITAL_COMMUNITY): Payer: Self-pay

## 2023-09-25 LAB — LAB REPORT - SCANNED
A1c: 5.8
EGFR: 102

## 2023-09-25 MED ORDER — VITAMIN D (ERGOCALCIFEROL) 1.25 MG (50000 UNIT) PO CAPS
50000.0000 [IU] | ORAL_CAPSULE | ORAL | 0 refills | Status: DC
  Filled 2023-09-25: qty 6, 42d supply, fill #0

## 2023-10-08 ENCOUNTER — Ambulatory Visit (HOSPITAL_COMMUNITY)
Admission: RE | Admit: 2023-10-08 | Discharge: 2023-10-08 | Disposition: A | Discharge status: Home / Self Care | Source: Ambulatory Visit | Attending: General Surgery | Admitting: General Surgery

## 2023-10-08 ENCOUNTER — Other Ambulatory Visit: Payer: Self-pay

## 2023-10-08 ENCOUNTER — Encounter: Attending: General Surgery | Admitting: Dietician

## 2023-10-08 ENCOUNTER — Encounter (HOSPITAL_COMMUNITY)
Admission: RE | Admit: 2023-10-08 | Discharge: 2023-10-08 | Disposition: A | Discharge status: Home / Self Care | Source: Ambulatory Visit | Attending: General Surgery

## 2023-10-08 ENCOUNTER — Encounter: Payer: Self-pay | Admitting: Dietician

## 2023-10-08 VITALS — Ht 64.0 in | Wt 244.5 lb

## 2023-10-08 DIAGNOSIS — Z01818 Encounter for other preprocedural examination: Secondary | ICD-10-CM | POA: Insufficient documentation

## 2023-10-08 DIAGNOSIS — E669 Obesity, unspecified: Secondary | ICD-10-CM | POA: Diagnosis not present

## 2023-10-08 DIAGNOSIS — K449 Diaphragmatic hernia without obstruction or gangrene: Secondary | ICD-10-CM | POA: Diagnosis not present

## 2023-10-08 DIAGNOSIS — K224 Dyskinesia of esophagus: Secondary | ICD-10-CM | POA: Diagnosis not present

## 2023-10-08 NOTE — Progress Notes (Signed)
 Nutrition Assessment for Bariatric Surgery: Pre-Surgery Behavioral and Nutrition Intervention Program   Medical Nutrition Therapy  Appt Start Time: 1013    End Time: 1118  Patient was seen on 10/08/2023 for Pre-Operative Nutrition Assessment. Purpose of todays visit  enhance perioperative outcomes along with a healthy weight maintenance   Referral stated Supervised Weight Loss (SWL) visits needed: 0  Pt completed visits.   Pt has cleared nutrition requirements.   Planned surgery: Sleeve Gastrectomy Pt expectation of surgery: to be more active; to be around 160-170 lbs  NUTRITION ASSESSMENT   Anthropometrics  Start weight at [facility]: 244.5 lbs (date: 10/08/2023)  Height: 64 in BMI: 41.97 kg/m2     Clinical   Pharmacotherapy: History of weight loss medication used: Mounjaro  (successful, but insurance stopped covering)  Medical hx: obesity Medications: synthroid , Zoloft , vit D, mirena , retin-A   Labs: vit D 23.8; A1c 5.8 Allergies: sulfa, hydro chlorquine, walnuts Notable signs/symptoms: none noted Any previous deficiencies? No  Evaluation of Nutritional Deficiencies: Micronutrient Nutrition Focused Physical Exam: Hair: No issues observed Eyes: No issues observed Mouth: No issues observed Neck: No issues observed Nails: No issues observed Skin: No issues observed  Lifestyle & Dietary Hx  Pt states she is going to re-draw blood for labs, stating she was not told to fast.  Pt states she walks on her lunch break. 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 Pt states her husband is a Company secretary.  Current Physical Activity Recommendations state 150 minutes per week of moderate to vigorous movement including Cardio and 1-2 days of resistance activities as well as flexibility/balance activities:  Pts current physical activity: walking 3 days per week, 30 minutes, with 60% recommendation reached   Sleep Hygiene: duration and  quality: good  Current Patient Perceived Stress Level as stated by pt on a scale of 1-10: 5 (Pt states she has a special needs child, stating her stress level is varied)    Stress Management Techniques: watch TV in the evening, hang out with husband on day off once a week.  According to the Dietary Guidelines for Americans Recommendation: equivalent 1.5-2 cups fruits per day, equivalent 2-3 cups vegetables per day and at least half all grains whole  Fruit servings per day (on average): 0, meeting 0% recommendation  Non-starchy vegetable servings per day (on average): 2-3, meeting 100% recommendation  Whole Grains per day (on average): 0  Number of meals missed/skipped per week out of 21: 7  24-Hr Dietary Recall First Meal: banana, protein shake Snack:  Second Meal: (mostly from healthy vending machine) [place] sandwich or asian bowl or pb&j on whole grain, yogurt parfait  Snack: NABS Third Meal: skip or small snack Snack: cheerios with milk Beverages: diet coke  Alcoholic beverages per week: 0   Estimated Energy Needs Calories: 1500  NUTRITION DIAGNOSIS  Overweight/obesity ([place]-3.3) related to past poor dietary habits and physical inactivity as evidenced by patient w/ planned sleeve surgery following dietary guidelines for continued weight loss.  NUTRITION INTERVENTION  Nutrition counseling (C-1) and education (E-2) to facilitate bariatric surgery goals.  Educated pt on micronutrient deficiencies post-surgery and behavioral/dietary strategies to start in order to mitigate that risk   Behavioral and Dietary Interventions Pre-Op Goals Reviewed with the Patient Nutrition: Healthy Eating Behaviors Switch to non-caloric, non-carbonated and non-caffeinated beverages such as  water , unsweetened tea, Crystal Light and zero calorie beverages (aim for 64 oz. per day) Cut out grazing between meals or at  night  Find a protein shake you like Eat every 3-5 hours        Eliminate distractions  while eating (TV, computer, reading, driving, texting) Take 79-69 minutes to eat a meal  Decrease high sugar foods/decrease high fat/fried foods Eliminate alcoholic beverages Increase protein intake (eggs, fish, chicken, yogurt) before surgery Eat non starchy vegetables 2 times a day 7 days a week Eat complex carbohydrates such as whole grains and fruits   Behavioral Modification: Physical Activity Increase my usual daily activity (use stairs, park farther, etc.) Engage in _______________________  activity  _______ minutes ______ times per week  Other:    _________________________________________________________________     Problem Solving I will think about my usual eating patterns and how to tweak them How can my friends and family support me Barriers to starting my changes Learn and understand appetite verses hunger   Healthy Coping Allow for ___________ activities per week to help me manage stress Reframe negative thoughts I will keep a picture of someone or something that is my inspiration & look at it daily   Monitoring  Weigh myself once a week  Measure my progress by monitoring how my clothes fit Keep a food record of what I eat and drink for the next ________ (time period) Take pictures of what I eat and drink for the next ________ (time period) Use an app to count steps/day for the next_______ (time period) Measure my progress such as increased energy and more restful sleep Monitor your acid reflux and bowel habits, are they getting better?   *Goals that are bolded indicate the pt would like to start working towards these  Handouts Provided Include  Bariatric Surgery handouts (Nutrition Visits, Pre Surgery Behavioral Change Goals, Protein Shakes Brands to Choose From, Vitamins & Mineral Supplementation)  Learning Style & Readiness for Change Teaching method utilized: Visual, Auditory, and hands on  Demonstrated degree of understanding via: Teach Back  Readiness  Level: preparation Barriers to learning/adherence to lifestyle change: nothing identified  RD's Notes for Next Visit Pt aware to contact us  with questions or concerns   MONITORING & EVALUATION Dietary intake, weekly physical activity, body weight, and preoperative behavioral change goals   Next Steps  Pt has completed visits. No further supervised visits required/recommended. Patient is to follow up at [facility] for pre-op class >2 weeks prior to scheduled surgery.

## 2023-10-13 ENCOUNTER — Encounter: Payer: Self-pay | Admitting: Family Medicine

## 2023-10-13 ENCOUNTER — Other Ambulatory Visit (HOSPITAL_COMMUNITY): Payer: Self-pay

## 2023-10-13 ENCOUNTER — Encounter: Admitting: Family Medicine

## 2023-10-13 ENCOUNTER — Ambulatory Visit (INDEPENDENT_AMBULATORY_CARE_PROVIDER_SITE_OTHER): Admitting: Family Medicine

## 2023-10-13 VITALS — BP 125/88 | HR 101 | Temp 98.1°F | Ht 64.0 in | Wt 247.2 lb

## 2023-10-13 DIAGNOSIS — E781 Pure hyperglyceridemia: Secondary | ICD-10-CM

## 2023-10-13 DIAGNOSIS — Z0000 Encounter for general adult medical examination without abnormal findings: Secondary | ICD-10-CM

## 2023-10-13 DIAGNOSIS — E038 Other specified hypothyroidism: Secondary | ICD-10-CM

## 2023-10-13 DIAGNOSIS — Z0001 Encounter for general adult medical examination with abnormal findings: Secondary | ICD-10-CM | POA: Diagnosis not present

## 2023-10-13 DIAGNOSIS — R7301 Impaired fasting glucose: Secondary | ICD-10-CM

## 2023-10-13 DIAGNOSIS — F439 Reaction to severe stress, unspecified: Secondary | ICD-10-CM

## 2023-10-13 DIAGNOSIS — F411 Generalized anxiety disorder: Secondary | ICD-10-CM

## 2023-10-13 DIAGNOSIS — M359 Systemic involvement of connective tissue, unspecified: Secondary | ICD-10-CM

## 2023-10-13 MED ORDER — LEVOTHYROXINE SODIUM 50 MCG PO TABS
50.0000 ug | ORAL_TABLET | Freq: Every day | ORAL | 3 refills | Status: AC
  Filled 2023-10-13 – 2024-02-12 (×3): qty 90, 90d supply, fill #0

## 2023-10-13 MED ORDER — SERTRALINE HCL 100 MG PO TABS
100.0000 mg | ORAL_TABLET | Freq: Every day | ORAL | 3 refills | Status: AC
  Filled 2023-10-13 – 2023-12-22 (×3): qty 90, 90d supply, fill #0

## 2023-10-13 NOTE — Patient Instructions (Signed)
 Please have pap smear sent to me Please remember to get your flu vaccine at work and get a copy of that sent to me to put in your chart. Good luck on your surgery.  I hope you have the best time cruising this fall!

## 2023-10-13 NOTE — Progress Notes (Signed)
 Raven [name] is a 33 y.o. female presents to office today for annual physical exam examination.    Discussed the use of AI scribe software for clinical note transcription with the patient, who gave verbal consent to proceed.  History of Present Illness  Patient is expected to get a gastric sleeve done soon with Dr. Tanda Saris [facility] surgery in [place].  She had a bunch of nonfasting labs done recently and was noted to be prediabetic.  She also had low vitamin D  and was placed on repletion.  Mild elevation in ALT at 39.  Did not get to proceed with breast reduction due to some insurance constraints but she is hoping with weight loss she will be able to avoid the surgery totally.  Occupation: health care, Marital status: married, Substance use: none Health Maintenance Due  Topic Date Due   Hepatitis B Vaccines 19-59 Average Risk (1 of 3 - 19+ 3-dose series) Never done   HPV VACCINES (1 - 3-dose SCDM series) Never done   COVID-19 Vaccine (3 - Pfizer risk series) 04/14/2019   Cervical Cancer Screening (HPV/Pap Cotest)  04/06/2020   INFLUENZA VACCINE  09/11/2023   Refills needed today: none  Immunization History  Administered Date(s) Administered   Influenza,inj,Quad PF,6+ Mos 11/16/2020   Influenza-Unspecified 11/10/2013, 11/10/2016, 11/11/2019   PFIZER(Purple Top)SARS-COV-2 Vaccination 02/24/2019, 03/17/2019   Tdap 05/01/2014   Past Medical History:  Diagnosis Date   Autoimmune disease (HCC) 07/25/2020   Complication of anesthesia    Elevated liver enzymes    Endometriosis    Medical history non-contributory    Otosclerosis    PONV (postoperative nausea and vomiting)    Preeclampsia in postpartum period 02/11/2022   SVT (supraventricular tachycardia) (HCC)    Tachycardia    Social History   Socioeconomic History   Marital status: Married    Spouse name: Not on file   Number of children: 1   Years of education: Not on file   Highest education level:  Bachelor's degree (e.g., BA, AB, BS)  Occupational History   Not on file  Tobacco Use   Smoking status: Never    Passive exposure: Never   Smokeless tobacco: Never  Vaping Use   Vaping status: Never Used  Substance and Sexual Activity   Alcohol use: No   Drug use: No   Sexual activity: Not Currently    Birth control/protection: I.U.D.  Other Topics Concern   Not on file  Social History Narrative   34 year old son has special needs, chromosomal deletion.    Social Drivers of Health   Financial Resource Strain: Medium Risk (10/13/2023)   Overall Financial Resource Strain (CARDIA)    Difficulty of Paying Living Expenses: Somewhat hard  Food Insecurity: No Food Insecurity (10/13/2023)   Hunger Vital Sign    Worried About Running Out of Food in the Last Year: Never true    Ran Out of Food in the Last Year: Never true  Transportation Needs: No Transportation Needs (10/13/2023)   PRAPARE - Administrator, Civil Service (Medical): No    Lack of Transportation (Non-Medical): No  Physical Activity: Insufficiently Active (10/13/2023)   Exercise Vital Sign    Days of Exercise per Week: 3 days    Minutes of Exercise per Session: 30 min  Stress: Stress Concern Present (10/13/2023)   [employer] of Occupational Health - Occupational Stress Questionnaire    Feeling of Stress: To some extent  Social Connections: Socially Integrated (10/13/2023)  Social Connection and Isolation Panel    Frequency of Communication with Friends and Family: More than three times a week    Frequency of Social Gatherings with Friends and Family: Once a week    Attends Religious Services: More than 4 times per year    Active Member of Clubs or Organizations: Yes    Attends Banker Meetings: More than 4 times per year    Marital Status: Married  Catering manager Violence: Not At Risk (02/12/2022)   Humiliation, Afraid, Rape, and Kick questionnaire    Fear of Current or Ex-Partner: No     Emotionally Abused: No    Physically Abused: No    Sexually Abused: No   Past Surgical History:  Procedure Laterality Date   CESAREAN SECTION N/A 06/14/2014   Procedure: CESAREAN SECTION;  Surgeon: Rosaline Luna, MD;  Location: [facility];  Service: Obstetrics;  Laterality: N/A;   CESAREAN SECTION WITH BILATERAL TUBAL LIGATION Bilateral 01/30/2022   Procedure: REPEAT CESAREAN SECTION WITH BILATERAL TUBAL LIGATION EDC: 02-05-22 ALLERG: HYDROXYCHLOROQUINE , SULFA, WALNUT  PREVIOUS X 1;  Surgeon: Dannielle Bouchard, DO;  Location: [facility];  Service: Obstetrics;  Laterality: Bilateral;   EXCISION MASS ABDOMINAL N/A 10/21/2019   Procedure: EXCISION MASS ABDOMINAL;  Surgeon: Signe Mitzie LABOR, MD;  Location: [facility];  Service: General;  Laterality: N/A;   stebactomy     Family History  Problem Relation Age of Onset   Other Mother        Elevated liver enzymes   Diabetes Mother    Hypertension Mother    Thyroid  disease Mother    Diabetes Father    Hypertension Father    Hyperlipidemia Father    Other Brother        Elevated liver enzymes   Selective mutism Son    Chromosomal disorder Son    Asthma Paternal Aunt    Asthma Paternal Grandmother    Allergic rhinitis Neg Hx    Eczema Neg Hx    Urticaria Neg Hx     Current Outpatient Medications:    levonorgestrel  (MIRENA , 52 MG,) 20 MCG/DAY IUD, , Disp: , Rfl:    levothyroxine  (SYNTHROID ) 50 MCG tablet, Take 1 tablet (50 mcg total) by mouth daily before breakfast., Disp: 90 tablet, Rfl: 3   sertraline  (ZOLOFT ) 100 MG tablet, Take 1 tablet (100 mg total) by mouth daily., Disp: 90 tablet, Rfl: 3   tretinoin  (RETIN-A ) 0.025 % cream, Apply topically at bedtime., Disp: 45 g, Rfl: 4   Vitamin D , Ergocalciferol , (DRISDOL ) 1.25 MG (50000 UNIT) CAPS capsule, Take 1 capsule (50,000 Units total) by mouth once a week for 42 days., Disp: 6 capsule, Rfl: 0   magic mouthwash (nystatin , lidocaine , diphenhydrAMINE , alum & mag hydroxide) suspension, Swish and  swallow 5 mLs 4 (four) times daily as needed for mouth pain., Disp: 180 mL, Rfl: 1   triamcinolone  (KENALOG ) 0.1 % paste, Use as directed 1 Application to oral ulcers 2 (two) times daily for 14 days., Disp: 5 g, Rfl: 2  Allergies  Allergen Reactions   Macrobid [Nitrofurantoin] Hives   Other     Walnuts - burns tongue    Hydroxychloroquine  Rash and Hives   Sulfamethoxazole-Trimethoprim Rash     ROS: Review of Systems Pertinent items noted in HPI and remainder of comprehensive ROS otherwise negative.    Physical exam BP 125/88   Pulse (!) 101   Temp 98.1 F (36.7 C)   Ht 5' 4 (1.626 m)   Wt  247 lb 4 oz (112.2 kg)   SpO2 96%   BMI 42.44 kg/m  General appearance: alert, cooperative, appears stated age, no distress, and morbidly obese Head: Normocephalic, without obvious abnormality, atraumatic Eyes: conjunctivae/corneas clear. PERRL, EOM's intact.  Ears: normal TM's and external ear canals both ears Nose: Nares normal. Septum midline. Mucosa normal. No drainage or sinus tenderness. Throat: lips, mucosa, and tongue normal; teeth and gums normal Neck: no adenopathy, supple, symmetrical, trachea midline, and thyroid  not enlarged, symmetric, no tenderness/mass/nodules Back: symmetric, no curvature. ROM normal. No CVA tenderness. Lungs: clear to auscultation bilaterally Breasts: macromastia Heart: regular rate and rhythm, S1, S2 normal, no murmur, click, rub or gallop Abdomen: soft, non-tender; bowel sounds normal; no masses,  no organomegaly Extremities: extremities normal, atraumatic, no cyanosis or edema Pulses: 2+ and symmetric Skin: Skin color, texture, turgor normal. No rashes or lesions Lymph nodes: Cervical, supraclavicular, and axillary nodes normal. Neurologic: Grossly normal      04/22/2023    3:58 PM 05/20/2022    8:27 AM 03/05/2022    2:28 PM  Depression screen PHQ 2/9  Decreased Interest 0 0 0  Down, Depressed, Hopeless 0 0 0  PHQ - 2 Score 0 0 0  Altered  sleeping 0 0 0  Tired, decreased energy 0 0 0  Change in appetite 0 0 0  Feeling bad or failure about yourself  0 0 0  Trouble concentrating 0 0 0  Moving slowly or fidgety/restless 0 0 0  Suicidal thoughts 0 0 0  PHQ-9 Score 0 0 0  Difficult doing work/chores Not difficult at all Not difficult at all Not difficult at all      04/22/2023    4:00 PM 05/20/2022    8:27 AM 03/05/2022    2:28 PM 11/22/2020   10:44 AM  GAD 7 : Generalized Anxiety Score  Nervous, Anxious, on Edge 0 0 0 0  Control/stop worrying 0 0 0 0  Worry too much - different things 0 0 0 1  Trouble relaxing 0 0 0 0  Restless 0 0 0 0  Easily annoyed or irritable 0 0 0 0  Afraid - awful might happen 0 0 0 0  Total GAD 7 Score 0 0 0 1  Anxiety Difficulty Not difficult at all Not difficult at all  Not difficult at all     Assessment/ Plan: Raven [name] here for annual physical exam.   Assessment & Plan   Annual physical exam  Morbid obesity (HCC) - Plan: CANCELED: CMP14+EGFR, CANCELED: VITAMIN D  25 Hydroxy (Vit-D Deficiency, Fractures)  Impaired fasting glucose - Plan: CANCELED: Bayer DCA Hb A1c Waived  Subclinical hypothyroidism - Plan: TSH + free T4, levothyroxine  (SYNTHROID ) 50 MCG tablet, CANCELED: TSH + free T4  Hypertriglyceridemia - Plan: CANCELED: CMP14+EGFR, CANCELED: Lipid Panel  Autoimmune disease (HCC) - Plan: CANCELED: CBC with Differential  Generalized anxiety disorder - Plan: sertraline  (ZOLOFT ) 100 MG tablet  Stress at home - Plan: sertraline  (ZOLOFT ) 100 MG tablet  She declined influenza vaccination.  She will get this done at work.  Cervical screening per OB/GYN.  She will have this sent over  I was able to print her nonfasting labs from [facility] and we will get these integrated into the system.  Does not look like they collected her thyroid  levels so I did place that order and she will have labs redrawn soon   Counseled on healthy lifestyle choices, including diet (rich in fruits,  vegetables and lean meats and  low in salt and simple carbohydrates) and exercise (at least 30 minutes of moderate physical activity daily).  Patient to follow up 1 year for CPE  Blake Goya M. Jolinda, DO

## 2023-10-26 ENCOUNTER — Other Ambulatory Visit (HOSPITAL_COMMUNITY): Payer: Self-pay

## 2023-10-26 ENCOUNTER — Other Ambulatory Visit: Payer: Self-pay

## 2023-10-27 ENCOUNTER — Other Ambulatory Visit (HOSPITAL_COMMUNITY): Payer: Self-pay

## 2023-11-03 ENCOUNTER — Ambulatory Visit (INDEPENDENT_AMBULATORY_CARE_PROVIDER_SITE_OTHER): Admitting: Licensed Clinical Social Worker

## 2023-11-03 DIAGNOSIS — F4320 Adjustment disorder, unspecified: Secondary | ICD-10-CM

## 2023-11-03 NOTE — Progress Notes (Signed)
 Virtual Visit via Video Note  I connected with Raven [name] on 11/03/23 at  5:00 PM EDT by a video enabled telemedicine application and verified that I am speaking with the correct person using two identifiers.  Location: Patient: Virtual, home, [place]  Provider: Virtual office, [place]    I discussed the limitations of evaluation and management by telemedicine and the availability of in person appointments. The patient expressed understanding and agreed to proceed.  I discussed the assessment and treatment plan with the patient. The patient was provided an opportunity to ask questions and all were answered. The patient agreed with the plan and demonstrated an understanding of the instructions.   The patient was advised to call back or seek an in-person evaluation if the symptoms worsen or if the condition fails to improve as anticipated.  Comprehensive Clinical Assessment (CCA) Note  11/03/2023 Raven [name] [id number]   Chief Complaint:  Chief Complaint  Patient presents with   BARIATRIC SCREENING   Visit Diagnosis:  Encounter Diagnosis  Name Primary?   Adjustment disorder, unspecified type Yes    CCA Biopsychosocial Intake/Chief Complaint:  BARIATRIC SCREENING  Current Symptoms/Problems: Raven [name] is a 34 y.o. year old adult patient reporting to [facility] for preliminary screening to determine bariatric surgery eligibility. Patient reports that they have tried several weight loss interventions in the past, including weight watchers, keto, exercise, tripeptide--successful, zepbound --insurance stopped paying for it, phentermine .  Raven [name] reports current medical concerns/medical history of none at time of assessment..  Patient reports no history of depression, anxiety, or other mental health disorders.  Raven [name] denies SI, HI, or perceptual disturbances at time of assessment. Patient denies substance use issues at time of  assessment. Raven [name]  reports that they are motivated to make positive changes to contribute to improved wellness and are seeking bariatric weight loss surgery as an intervention to support wellness goals.   Patient Reported Schizophrenia/Schizoaffective Diagnosis in Past: No   Strengths: Patient reports positive family supports, patient enjoys spending time with others, patient has supportive partner  Preferences: Due to unsuccessful weight loss interventions in the past, patient seeking bariatric weight loss surgery  Abilities: Patient is able to make positive behavioral choices, patient has the ability to develop wellness goals for self, patient has the ability to work full-time, patient is able to make lasting change   Type of Services Patient Feels are Needed: Patient seeking bariatric weight loss surgery   Initial Clinical Notes/Concerns: Patient reports mental health is stable without any mental health interventions.   Mental Health Symptoms Depression:  None   Duration of Depressive symptoms: No data recorded  Mania:  None   Anxiety:   Worrying   Psychosis:  None   Duration of Psychotic symptoms: No data recorded  Trauma:  None   Obsessions:  None   Compulsions:  None   Inattention:  None   Hyperactivity/Impulsivity:  None   Oppositional/Defiant Behaviors:  None   Emotional Irregularity:  None   Other Mood/Personality Symptoms:  Patient denies any additional symptoms    Mental Status Exam Appearance and self-care  Stature:  Average   Weight:  Obese   Clothing:  Neat/clean   Grooming:  Normal   Cosmetic use:  Age appropriate   Posture/gait:  Normal   Motor activity:  Not Remarkable   Sensorium  Attention:  Normal   Concentration:  Normal   Orientation:  X5   Recall/memory:  Normal   Affect and  Mood  Affect:  Appropriate   Mood:  Other (Comment) (Within normal limits)   Relating  Eye contact:  Normal   Facial expression:   Responsive   Attitude toward examiner:  Cooperative   Thought and Language  Speech flow: Normal   Thought content:  Appropriate to Mood and Circumstances   Preoccupation:  None   Hallucinations:  None   Organization: Coherent, goal directed  [employer] of Knowledge:  Good   Intelligence:  Above Average   Abstraction:  Normal   Judgement:  Good   Reality Testing:  Realistic   Insight:  Good   Decision Making:  Normal   Social Functioning  Social Maturity:  Responsible   Social Judgement:  Normal   Stress  Stressors:  Grief/losses (loss of a pet)   Coping Ability:  Normal   Skill Deficits:  None   Supports:  Family (husband and mother and grandmother)     Religion: Religion/Spirituality How Might This Affect Treatment?: no religious barriers to treatment  Leisure/Recreation: Leisure / Recreation Do You Have Hobbies?: Yes Leisure and Hobbies: Agricultural consultant at school, PTO, visiting family (mom/grandma), windowshopping  Exercise/Diet: Exercise/Diet Do You Exercise?: Yes What Type of Exercise Do You Do?: Run/Walk (runing around with children; walks 3 x per week) How Many Times a Week Do You Exercise?: 1-3 times a week Have You Gained or Lost A Significant Amount of Weight in the Past Six Months?: No Do You Follow a Special Diet?: Yes Type of Diet: no sodas and tea; increased proten and veggies Do You Have Any Trouble Sleeping?: No   CCA Employment/Education Employment/Work Situation: Employment / Work Situation Employment Situation: Employed 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?: No  Education: Education Name of [employer]: [employer]   CCA  Family/Childhood History Family and Relationship History: Family history Marital status: Married Number of Years Married: 12 Does patient have children?: Yes How many children?: 2 How is patient's relationship with their children?: 4th grade, 8 month old  Childhood History:  Childhood History By whom was/is the patient raised?: Mother, Grandparents Additional childhood history information: pt lived with both parents (married), moved in with grandma when grandfather died Description of patient's relationship with caregiver when they were a child: stable How were you disciplined when you got in trouble as a child/adolescent?: fairly Does patient have siblings?: Yes Number of Siblings: 1 Description of patient's current relationship with siblings: brother--stable relationship Did patient suffer any verbal/emotional/physical/sexual abuse as a child?: No Did patient suffer from severe childhood neglect?: No Has patient ever been sexually abused/assaulted/raped as an adolescent or adult?: No Was the patient ever a victim of a crime or a disaster?: No Witnessed domestic violence?: No Has patient been affected by domestic violence as an adult?: No  Child/Adolescent Assessment:     CCA Substance Use Alcohol/Drug Use: Alcohol / Drug Use Pain Medications: SEE MAR Prescriptions: SEE MAR Over the Counter: SEE MAR History of alcohol / drug use?: No history of alcohol / drug abuse Longest period of sobriety (when/how long): ONGOING Negative Consequences of Use:  (NONE) Withdrawal Symptoms: None   ASAM's:  Six Dimensions of Multidimensional Assessment  Dimension 1:  Acute Intoxication and/or  Withdrawal Potential:   Dimension 1:  Description of individual's past and current experiences of substance use and withdrawal: None  Dimension 2:  Biomedical Conditions and Complications:   Dimension 2:  Description of patient's biomedical conditions and  complications: None  Dimension 3:   Emotional, Behavioral, or Cognitive Conditions and Complications:  Dimension 3:  Description of emotional, behavioral, or cognitive conditions and complications: None  Dimension 4:  Readiness to Change:  Dimension 4:  Description of Readiness to Change criteria: None  Dimension 5:  Relapse, Continued use, or Continued Problem Potential:  Dimension 5:  Relapse, continued use, or continued problem potential critiera description: None  Dimension 6:  Recovery/Living Environment:  Dimension 6:  Recovery/Iiving environment criteria description: None  ASAM Severity Score: ASAM's Severity Rating Score: 0  ASAM Recommended Level of Treatment: ASAM Recommended Level of Treatment: Level I Outpatient Treatment   Substance use Disorder (SUD) Substance Use Disorder (SUD)  Checklist Symptoms of Substance Use:  (None)  Recommendations for Services/Supports/Treatments: Recommendations for Services/Supports/Treatments Recommendations For Services/Supports/Treatments: Individual Therapy (Therapy recommended as needed)  DSM5 Diagnoses: Patient Active Problem List   Diagnosis Date Noted   Subclinical hypothyroidism 10/13/2023   Hypertriglyceridemia 10/13/2023   Impaired fasting glucose 10/13/2023   Dupuytren's contracture of right hand 08/20/2022   Patellar tendonitis of right knee 08/20/2022   Pain in left foot 08/20/2022   Symptomatic mammary hypertrophy 07/09/2022   Back pain 07/09/2022   Previous cesarean section 01/30/2022   Morbid obesity (HCC) 09/28/2020   Positive ANA (antinuclear antibody) 07/25/2020   High risk medication use 07/25/2020   Autoimmune disease 07/25/2020   Pain of right thumb 06/12/2020   Adverse reaction to food, subsequent encounter 05/11/2020   Pain of breast 12/02/2019   Abdominal mass 07/22/2019   Multiple joint pain 03/22/2019   Bilateral hearing loss 11/01/2018   Endometriosis 10/12/2018   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 Date of Interview:  11/03/23 Gender: F   Date of Report : 11/03/23 Purpose:   Bariatric/Weight-loss Surgery (pre-operative evaluation)    Assessment Instruments:  DSM-5-TR Self-Rated Level 1 Cross-Cutting Symptom Measure--Adult Severity Measure for Generalized Anxiety Disorder--Adult EAT-26 (Eating Attitudes Test) SSS-8 (Somatic Symptom Scale)  Chief Complaint: BARIATRIC SCREENING  Client Background: Patient is a 33 year old female seeking weight loss surgery. Patient has bachelor degree education and is currently working as a Transport planner. .  Patients marital status is married.   The patient is 5 feet 3 inches tall and 240 lbs., reflecting a BMI of 42.58 classifying patient in the obese range and at further risk of co-morbid diseases.  Tobacco Use: Patient denies tobacco use.   PATIENT BEHAVIORAL ASSESSMENT SCORES  Personal History of Mental Illness: Patient denies treatment for depression and anxiety.   Mental Status Examination: Patient was oriented x5 (person, place, situation, time, and object). Patient was appropriately groomed, and neatly dressed. Patient was alert, engaged, pleasant, and cooperative. Patient denies suicidal and homicidal ideations or any perceptual disturbances. Patient denies self-injury.   DSM-5-TR Self-Rated Level 1 Cross-Cutting Symptom Measure--Adult: Patient completed 23-item questionnaire assessing symptoms related to depression, anger, mania, anxiety, somatic symptoms, suicidal ideation, psychosis, insomnia, memory concerns, repetitive behaviors, dissociation, personality functioning and substance use. Raven [name] scored 0  in all domain.   Severity Measure for Generalized Anxiety Disorder--Adult: Patient  completed a 10-item  scale. Total scores can range from 0 to 40. A raw score is calculated by summing the answer to each question, and an average total score is achieved by dividing the raw score by the number of items (e.g., 10).Raven [name] had a total raw score of 0 out of 40 which was divided by the total number of questions answered (10) to get an average score of 0 which indicates no significant anxiety.   EAT-26: The EAT-26 is a twenty-six-question screening tool to identify symptoms of dieting behaviors, bulimia, food preoccupation and oral control.  Raven [name] scored 5 out of 26. Scores below a 20 are considered not meeting criteria for disordered eating. Patient denies inducing vomiting, or intentional meal skipping. Patient denies binge eating behaviors. Patient denies laxative abuse. Patient does not meet criteria for a DSM-V eating disorder.  SSS-8: The SSS-8, or Somatic Symptom Scale-8, is a brief self-report questionnaire used to assess the perceived burden of common somatic (physical) symptoms.  (SSS-8) is scored by summing the responses to eight items, each rated on a 5-point Likert scale from 0 (Not at all) to 4 (Very much). Total scores range from 0 to 32, with higher scores indicating greater somatic symptom burden. Scores are categorized into five severity levels: no/minimal, low, medium, high, and very high somatic symptom burden. Raven [name] scored 3 out of 32, which indicates minimal score.   Conclusion & Recommendations:   Health history and current assessment reflect that patient is suitable to be a candidate for bariatric surgery. Patient understands the procedure, the risks associated with it, and the importance of post-operative holistic care (Physical, Spiritual/Values, Relationships, and Mental/Emotional health) with access to resources for support as needed. The patient has made an informed decision to proceed with procedure. The patient is motivated  and expressed understanding of the post-surgical requirements. Patient's psychological assessment will be valid from today's date for 6 months (05/02/2024). After that date, a follow-up appointment will be needed to re-evaluate the patient's psychological status.   Clinician sees no significant psychological factors that would hinder the success of bariatric surgery at time of assessment. Clinician supports patient candidacy for Bariatric Surgery.   Tawni JONELLE Brisker, MSW, LCSW Licensed Clinical Social [facility]     Referrals to Alternative Service(s): Referred to Alternative Service(s):   Place:   Date:   Time:    Referred to Alternative Service(s):   Place:   Date:   Time:    Referred to Alternative Service(s):   Place:   Date:   Time:    Referred to Alternative Service(s):   Place:   Date:   Time:      Collaboration of Care: Other clinician releases this patient back to bariatric team members and encouraged patient to follow all ongoing team recommendations and to seek psychotherapy services as needed.  Patient/Guardian was advised Release of Information must be obtained prior to any record release in order to collaborate their care with an outside provider. Patient/Guardian was advised if they have not already done so to contact the registration department to sign all necessary forms in order for us  to release information regarding their care.   Consent: Patient/Guardian gives verbal consent for treatment and assignment of benefits for services provided during this visit. Patient/Guardian expressed understanding and agreed to proceed.   Sebron Mcmahill R Javonda Suh, LCSW

## 2023-11-05 ENCOUNTER — Telehealth: Payer: Self-pay | Admitting: Family Medicine

## 2023-11-05 NOTE — Telephone Encounter (Signed)
Please send requested records

## 2023-11-05 NOTE — Telephone Encounter (Signed)
 Per request Records sent to [facility]

## 2023-11-05 NOTE — Telephone Encounter (Signed)
 Copied from CRM [id number]. Topic: Clinical - Lab/Test Results >> Nov 05, 2023  1:18 PM Delon HERO wrote: Reason for CRM: Patient is calling because a referral was placed on 09/08/2023 to [facility] [address] [place] [address] [phone] [facility] is requesting labs and office visit notes before they are able to schedule with the patient.   Requesting rheumod factor, Sed rate, CRP, ANA,   Patient has [facility] at her office - requesting orders to be placed there.   Please contact the patient to schedule

## 2023-11-06 DIAGNOSIS — E038 Other specified hypothyroidism: Secondary | ICD-10-CM | POA: Diagnosis not present

## 2023-11-07 LAB — TSH+FREE T4
Free T4: 0.98 ng/dL (ref 0.82–1.77)
TSH: 1.1 u[IU]/mL (ref 0.450–4.500)

## 2023-11-09 ENCOUNTER — Encounter: Payer: Self-pay | Admitting: Family Medicine

## 2023-11-09 ENCOUNTER — Ambulatory Visit: Payer: Self-pay | Admitting: Family Medicine

## 2023-11-09 DIAGNOSIS — H8093 Unspecified otosclerosis, bilateral: Secondary | ICD-10-CM | POA: Diagnosis not present

## 2023-11-23 ENCOUNTER — Other Ambulatory Visit (HOSPITAL_COMMUNITY): Payer: Self-pay

## 2023-11-23 MED ORDER — FLUZONE 0.5 ML IM SUSY
0.5000 mL | PREFILLED_SYRINGE | Freq: Once | INTRAMUSCULAR | 0 refills | Status: AC
  Filled 2023-11-23: qty 0.5, 1d supply, fill #0

## 2023-12-14 ENCOUNTER — Encounter: Attending: General Surgery | Admitting: Skilled Nursing Facility1

## 2023-12-15 ENCOUNTER — Encounter: Payer: Self-pay | Admitting: Skilled Nursing Facility1

## 2023-12-15 NOTE — Progress Notes (Signed)
 Pre-Operative Nutrition Class:    Patient was seen on 12/14/2023 for Pre-Operative Bariatric Surgery Education at [facility]    Surgery date: sleeve Surgery type: 01/04/2024 Start weight at [facility]: 244.5 Weight today: 257.6  Samples given per MNT protocol. Patient educated on appropriate usage:  Celebrate Vitamins Multivitamin Lot # [id number] Exp: 09/26   Celebrate Vitamins Calcium   Lot # [id number] Exp: 10/26   Ensure Protein Shake Lot # [id number] Exp: Mar 13, 2024     The following the learning objectives were met by the patient during this course: Identify Pre-Op Dietary Goals and will begin 2 weeks pre-operatively Identify appropriate sources of fluids and proteins  State protein recommendations and appropriate sources pre and post-operatively Identify Post-Operative Dietary Goals and will follow for 2 weeks post-operatively Identify appropriate multivitamin, calcium , and thiamin sources Describe the need for physical activity post-operatively and will follow MD recommendations State when to call healthcare provider regarding medication questions or post-operative complications When having a diagnosis of diabetes understanding hypoglycemia symptoms and the inclusion of 1 complex carbohydrate per meal  Handouts given during class include: Pre-Op Bariatric Surgery Diet Handout Protein Shake Handout Post-Op Bariatric Surgery Nutrition Handout BEL[facility] Program Information Flyer [employer] Information Flyer [facility] Bariatric Supplements Price List  Follow-Up Plan: Patient will follow-up at [facility] 2 weeks post operatively for diet advancement per MD.

## 2023-12-22 ENCOUNTER — Other Ambulatory Visit (HOSPITAL_COMMUNITY): Payer: Self-pay

## 2023-12-23 DIAGNOSIS — M2550 Pain in unspecified joint: Secondary | ICD-10-CM | POA: Diagnosis not present

## 2023-12-23 DIAGNOSIS — E781 Pure hyperglyceridemia: Secondary | ICD-10-CM | POA: Diagnosis not present

## 2023-12-23 DIAGNOSIS — R7303 Prediabetes: Secondary | ICD-10-CM | POA: Diagnosis not present

## 2023-12-23 DIAGNOSIS — K449 Diaphragmatic hernia without obstruction or gangrene: Secondary | ICD-10-CM | POA: Diagnosis not present

## 2023-12-23 DIAGNOSIS — E559 Vitamin D deficiency, unspecified: Secondary | ICD-10-CM | POA: Diagnosis not present

## 2023-12-24 ENCOUNTER — Ambulatory Visit: Payer: Self-pay | Admitting: General Surgery

## 2023-12-24 DIAGNOSIS — Z01818 Encounter for other preprocedural examination: Secondary | ICD-10-CM

## 2023-12-24 NOTE — Progress Notes (Signed)
 Sent message, via epic in basket, requesting orders in epic from Careers adviser.

## 2023-12-25 ENCOUNTER — Other Ambulatory Visit (HOSPITAL_COMMUNITY): Payer: Self-pay

## 2023-12-31 ENCOUNTER — Encounter (HOSPITAL_COMMUNITY)
Admission: RE | Admit: 2023-12-31 | Discharge: 2023-12-31 | Disposition: A | Discharge status: Home / Self Care | Source: Ambulatory Visit | Attending: Family Medicine | Admitting: Family Medicine

## 2023-12-31 DIAGNOSIS — M25562 Pain in left knee: Secondary | ICD-10-CM | POA: Diagnosis not present

## 2023-12-31 DIAGNOSIS — M79642 Pain in left hand: Secondary | ICD-10-CM | POA: Diagnosis not present

## 2023-12-31 DIAGNOSIS — M25561 Pain in right knee: Secondary | ICD-10-CM | POA: Diagnosis not present

## 2023-12-31 DIAGNOSIS — M79641 Pain in right hand: Secondary | ICD-10-CM | POA: Diagnosis not present

## 2023-12-31 DIAGNOSIS — R7689 Other specified abnormal immunological findings in serum: Secondary | ICD-10-CM | POA: Diagnosis not present

## 2023-12-31 DIAGNOSIS — Z6841 Body Mass Index (BMI) 40.0 and over, adult: Secondary | ICD-10-CM | POA: Diagnosis not present

## 2023-12-31 DIAGNOSIS — K121 Other forms of stomatitis: Secondary | ICD-10-CM | POA: Diagnosis not present

## 2024-01-04 ENCOUNTER — Inpatient Hospital Stay (HOSPITAL_COMMUNITY): Admit: 2024-01-04 | Admitting: General Surgery

## 2024-01-04 SURGERY — GASTRECTOMY, SLEEVE, LAPAROSCOPIC
Anesthesia: General

## 2024-01-05 ENCOUNTER — Telehealth: Payer: Self-pay | Admitting: Dietician

## 2024-01-05 NOTE — Telephone Encounter (Signed)
 Called patient to confirm that she should follow the pre-op liver shrinking diet starting two weeks before bariatric surgery. All of the patient's questions were answered and patient expressed understanding.

## 2024-01-19 ENCOUNTER — Encounter

## 2024-01-27 NOTE — Progress Notes (Signed)
 Sent message, via epic in basket, requesting orders in epic from Careers adviser.

## 2024-01-31 ENCOUNTER — Encounter: Payer: Self-pay | Admitting: Family Medicine

## 2024-02-01 ENCOUNTER — Ambulatory Visit: Payer: Self-pay | Admitting: General Surgery

## 2024-02-01 ENCOUNTER — Telehealth: Admitting: Physician Assistant

## 2024-02-01 DIAGNOSIS — E781 Pure hyperglyceridemia: Secondary | ICD-10-CM

## 2024-02-01 DIAGNOSIS — J101 Influenza due to other identified influenza virus with other respiratory manifestations: Secondary | ICD-10-CM | POA: Diagnosis not present

## 2024-02-01 DIAGNOSIS — R7301 Impaired fasting glucose: Secondary | ICD-10-CM

## 2024-02-01 MED ORDER — BENZONATATE 100 MG PO CAPS: 100.0000 mg | ORAL_CAPSULE | Freq: Three times a day (TID) | ORAL | 0 refills | Status: DC | PRN

## 2024-02-01 MED ORDER — OSELTAMIVIR PHOSPHATE 75 MG PO CAPS: 75.0000 mg | ORAL_CAPSULE | Freq: Two times a day (BID) | ORAL | 0 refills | Status: DC

## 2024-02-01 NOTE — Progress Notes (Signed)
 E visit for Flu like symptoms   We are sorry that you are not feeling well.  Here is how we plan to help! Based on what you have shared with me it looks like you may have a respiratory virus that may be influenza.  Influenza or the flu is  an infection caused by a respiratory virus. The flu virus is highly contagious and persons who did not receive their yearly flu vaccination may catch the flu from close contact.  We have anti-viral medications to treat the viruses that cause this infection. They are not a cure and only shorten the course of the infection. These prescriptions are most effective when they are given within the first 2 days of flu symptoms. Antiviral medications are indicated if you have a high risk of complications from the flu. You should  also consider an antiviral medication if you are in close contact with someone who is at risk. These medications can help patients avoid complications from the flu but have side effects that you should know.   Possible side effects from Tamiflu  or oseltamivir  include nausea, vomiting, diarrhea, dizziness, headaches, eye redness, sleep problems or other respiratory symptoms. You should not take Tamiflu  if you have an allergy to oseltamivir  or any to the ingredients in Tamiflu .  Based upon your symptoms and potential risk factors I have prescribed Oseltamivir  (Tamiflu ).  It has been sent to your designated pharmacy.  You will take one 75 mg capsule orally twice a day for the next 5 days.   For nasal congestion, you may use an oral decongestant such as Mucinex D or if you have glaucoma or high blood pressure use plain Mucinex.  Saline nasal spray or nasal drops can help and can safely be used as often as needed for congestion.  If you have a sore or scratchy throat, use a saltwater gargle-  to  teaspoon of salt dissolved in a 4-ounce to 8-ounce glass of warm water .  Gargle the solution for approximately 15-30 seconds and then spit.  It is  important not to swallow the solution.  You can also use throat lozenges/cough drops and Chloraseptic spray to help with throat pain or discomfort.  Warm or cold liquids can also be helpful in relieving throat pain.  For headache, pain or general discomfort, you can use Ibuprofen  or Tylenol  as directed.   Some authorities believe that zinc sprays or the use of Echinacea may shorten the course of your symptoms.  I have prescribed the following medications to help lessen symptoms: I have prescribed Tessalon  Perles 100 mg. You may take 1-2 capsules every 8 hours as needed for cough  You are to isolate at home until you have been fever-free for at least 24 hours without a fever-reducing medication, and symptoms have been steadily improving for 24 hours.  If you must be around other household members who do not have symptoms, you need to make sure that both you and the family members are masking consistently with a high-quality mask.  If you note any worsening of symptoms despite treatment, please seek an in-person evaluation ASAP. If you note any significant shortness of breath or any chest pain, please seek ED evaluation. Please do not delay care!  ANYONE WHO HAS FLU SYMPTOMS SHOULD: Stay home. The flu is highly contagious and going out or to work exposes others! Be sure to drink plenty of fluids. Water  is fine as well as fruit juices, sodas and electrolyte beverages. You may want to stay  away from caffeine  or alcohol. If you are nauseated, try taking small sips of liquids. How do you know if you are getting enough fluid? Your urine should be a pale yellow or almost colorless. Get rest. Taking a steamy shower or using a humidifier may help nasal congestion and ease sore throat pain. Using a saline nasal spray works much the same way. Cough drops, hard candies and sore throat lozenges may ease your cough. Line up a caregiver. Have someone check on you regularly.  GET HELP RIGHT AWAY IF: You cannot  keep down liquids or your medications. You become short of breath Your fell like you are going to pass out or loose consciousness. Your symptoms persist after you have completed your treatment plan  MAKE SURE YOU  Understand these instructions. Will watch your condition. Will get help right away if you are not doing well or get worse.  Your e-visit answers were reviewed by a board certified advanced clinical practitioner to complete your personal care plan.  Depending on the condition, your plan could have included both over the counter or prescription medications.  If there is a problem please reply  once you have received a response from your provider.  Your safety is important to us .  If you have drug allergies check your prescription carefully.    You can use MyChart to ask questions about todays visit, request a non-urgent call back, or ask for a work or school excuse for 24 hours related to this e-Visit. If it has been greater than 24 hours you will need to follow up with your provider, or enter a new e-Visit to address those concerns.  You will get an e-mail in the next two days asking about your experience.  I hope that your e-visit has been valuable and will speed your recovery. Thank you for using e-visits.   I have spent 5 minutes in review of e-visit questionnaire, review and updating patient chart, medical decision making and response to patient.   Raven CHRISTELLA Dickinson, PA-C

## 2024-02-01 NOTE — Progress Notes (Signed)
 Second request for pre op orders spoke with [facility].

## 2024-02-02 NOTE — Patient Instructions (Signed)
 SURGICAL WAITING ROOM VISITATION Patients having surgery or a procedure may have no more than 2 support people in the waiting area - these visitors may rotate in the visitor waiting room.   Due to an increase in RSV and influenza rates and associated hospitalizations, children ages 79 and under may not visit patients in [facility] hospitals. If the patient needs to stay at the hospital during part of their recovery, the visitor guidelines for inpatient rooms apply.  PRE-OP VISITATION  Pre-op nurse will coordinate an appropriate time for 1 support person to accompany the patient in pre-op.  This support person may not rotate.  This visitor will be contacted when the time is appropriate for the visitor to come back in the pre-op area.  Please refer to the [facility] website for the visitor guidelines for Inpatients (after your surgery is over and you are in a regular room).  You are not required to quarantine at this time prior to your surgery. However, you must do this: Hand Hygiene often Do NOT share personal items Notify your provider if you are in close contact with someone who has COVID or you develop fever 100.4 or greater, new onset of sneezing, cough, sore throat, shortness of breath or body aches.  If you test positive for Covid or have been in contact with anyone that has tested positive in the last 10 days please notify you surgeon.    Your procedure is scheduled on:  02/15/24  Report to [facility] Main Entrance: [place] entrance where the [employer] is available.   Report to admitting at:     AM  Call this number if you have any questions or problems the morning of surgery [phone]  FOLLOW ANY ADDITIONAL PRE OP INSTRUCTIONS YOU RECEIVED FROM YOUR SURGEON'S OFFICE!!!  Do not eat food after : 6:00 PM the day before surgery/procedure.  After 6:00 PM you may have the following liquids until : 6:00 AM DAY OF SURGERY  Clear Liquid Diet Water  Black Coffee (sugar  ok, NO MILK/CREAM OR CREAMERS)  Tea (sugar ok, NO MILK/CREAM OR CREAMERS) regular and decaf                             Plain Jell-O  with no fruit (NO RED)                                           Fruit ices (not with fruit pulp, NO RED)                                     Popsicles (NO RED)                                                                  Juice: NO CITRUS JUICES: only apple, WHITE grape, WHITE cranberry Sports drinks like Gatorade or Powerade (NO RED)   The day of surgery:  Drink ONE (1) Pre-Surgery Clear G2 at : 6:00 AM the morning of surgery. Drink in one sitting. Do not sip.  This drink was given to you during your hospital pre-op appointment visit. Nothing else to drink after completing the Pre-Surgery Clear Ensure or G2 : No candy, chewing gum or throat lozenges.   MORNING OF SURGERY DRINK:   DRINK 1 G2 drink BEFORE YOU LEAVE HOME, DRINK ALL OF THE  G2 DRINK AT ONE TIME.   NO SOLID FOOD AFTER 600 PM THE NIGHT BEFORE YOUR SURGERY. YOU MAY DRINK CLEAR FLUIDS. THE G2 DRINK YOU DRINK BEFORE YOU LEAVE HOME WILL BE THE LAST FLUIDS YOU DRINK BEFORE SURGERY.  PAIN IS EXPECTED AFTER SURGERY AND WILL NOT BE COMPLETELY ELIMINATED. AMBULATION AND TYLENOL  WILL HELP REDUCE INCISIONAL AND GAS PAIN. MOVEMENT IS KEY!  YOU ARE EXPECTED TO BE OUT OF BED WITHIN 4 HOURS OF ADMISSION TO YOUR PATIENT ROOM.  SITTING IN THE RECLINER THROUGHOUT THE DAY IS IMPORTANT FOR DRINKING FLUIDS AND MOVING GAS THROUGHOUT THE GI TRACT.  COMPRESSION STOCKINGS SHOULD BE WORN [facility] STAY UNLESS YOU ARE WALKING.   INCENTIVE SPIROMETER SHOULD BE USED EVERY HOUR WHILE AWAKE TO DECREASE POST-OPERATIVE COMPLICATIONS SUCH AS PNEUMONIA.  WHEN DISCHARGED HOME, IT IS IMPORTANT TO CONTINUE TO WALK EVERY HOUR AND USE THE INCENTIVE SPIROMETER EVERY HOUR.      Oral Hygiene is also important to reduce your risk of infection.        Remember - BRUSH YOUR TEETH THE MORNING OF SURGERY WITH YOUR REGULAR  TOOTHPASTE  Do NOT smoke after Midnight the night before surgery.  STOP TAKING all Vitamins, Herbs and supplements 1 week before your surgery.   Take ONLY these medicines the morning of surgery with A SIP OF WATER : sertraline ,levothyroxine .                   You may not have any metal on your body including hair pins, jewelry, and body piercing  Do not wear make-up, lotions, powders, perfumes / cologne, or deodorant  Do not wear nail polish including gel and S&S, artificial / acrylic nails, or any other type of covering on natural nails including finger and toenails. If you have artificial nails, gel coating, etc., that needs to be removed by a nail salon, Please have this removed prior to surgery. Not doing so may mean that your surgery could be cancelled or delayed if the Surgeon or anesthesia staff feels like they are unable to monitor you safely.   Do not shave 48 hours prior to surgery to avoid nicks in your skin which may contribute to postoperative infections.   Contacts, Hearing Aids, dentures or bridgework may not be worn into surgery. DENTURES WILL BE REMOVED PRIOR TO SURGERY PLEASE DO NOT APPLY Poly grip OR ADHESIVES!!!  You may bring a small overnight bag with you on the day of surgery, only pack items that are not valuable. [facility] IS NOT RESPONSIBLE   FOR VALUABLES THAT ARE LOST OR STOLEN.   Patients discharged on the day of surgery will not be allowed to drive home.  Someone NEEDS to stay with you for the first 24 hours after anesthesia.  Do not bring your home medications to the hospital. The Pharmacy will dispense medications listed on your medication list to you during your admission in the Hospital.  Special Instructions: Bring a copy of your healthcare power of attorney and living will documents the day of surgery, if you wish to have them scanned into your [facility] Records- EPIC  Please read over the following fact sheets you were given: IF  YOU HAVE  QUESTIONS ABOUT YOUR PRE-OP INSTRUCTIONS, PLEASE CALL [phone]   [facility] - Preparing for Surgery      Before surgery, you can play an important role.  Because skin is not sterile, your skin needs to be as free of germs as possible.  You can reduce the number of germs on your skin by washing with CHG (chlorahexidine gluconate) soap before surgery.  CHG is an antiseptic cleaner which kills germs and bonds with the skin to continue killing germs even after washing. Please DO NOT use if you have an allergy to CHG or antibacterial soaps.  If your skin becomes reddened/irritated stop using the CHG and inform your nurse when you arrive at Short Stay. Do not shave (including legs and underarms) for at least 48 hours prior to the first CHG shower.  You may shave your face/neck.  Please follow these instructions carefully:  1.  Shower with CHG Soap the night before surgery ONLY (DO NOT USE THE SOAP THE MORNING OF SURGERY).  2.  If you choose to wash your hair, wash your hair first as usual with your normal  shampoo.  3.  After you shampoo, rinse your hair and body thoroughly to remove the shampoo.                             4.  Use CHG as you would any other liquid soap.  You can apply chg directly to the skin and wash.  Gently with a scrungie or clean washcloth.  5.  Apply the CHG Soap to your body ONLY FROM THE NECK DOWN.   Do not use on face/ open                           Wound or open sores. Avoid contact with eyes, ears mouth and genitals (private parts).                       Wash face,  Genitals (private parts) with your normal soap.             6.  Wash thoroughly, paying special attention to the area where your  surgery  will be performed.  7.  Thoroughly rinse your body with warm water  from the neck down.  8.  DO NOT shower/wash with your normal soap after using and rinsing off the CHG Soap.                9.  Pat yourself dry with a clean towel.            10.  Wear clean pajamas.             11.  Place clean sheets on your bed the night of your first shower and do not  sleep with pets.  Day of Surgery : Do not apply any CHG, lotions/deodorants the morning of surgery.  Please wear clean clothes to the hospital/surgery center.   FAILURE TO FOLLOW THESE INSTRUCTIONS MAY RESULT IN THE CANCELLATION OF YOUR SURGERY  PATIENT SIGNATURE_________________________________  NURSE SIGNATURE__________________________________  ________________________________________________________________________

## 2024-02-03 ENCOUNTER — Other Ambulatory Visit: Payer: Self-pay

## 2024-02-03 ENCOUNTER — Encounter (HOSPITAL_COMMUNITY)
Admission: RE | Admit: 2024-02-03 | Discharge: 2024-02-03 | Disposition: A | Discharge status: Home / Self Care | Source: Ambulatory Visit | Attending: General Surgery | Admitting: General Surgery

## 2024-02-03 ENCOUNTER — Encounter (HOSPITAL_COMMUNITY): Payer: Self-pay

## 2024-02-03 VITALS — BP 120/84 | HR 100 | Temp 99.1°F | Ht 64.0 in | Wt 240.0 lb

## 2024-02-03 DIAGNOSIS — R7301 Impaired fasting glucose: Secondary | ICD-10-CM | POA: Insufficient documentation

## 2024-02-03 DIAGNOSIS — E781 Pure hyperglyceridemia: Secondary | ICD-10-CM | POA: Insufficient documentation

## 2024-02-03 DIAGNOSIS — Z6841 Body Mass Index (BMI) 40.0 and over, adult: Secondary | ICD-10-CM | POA: Diagnosis not present

## 2024-02-03 DIAGNOSIS — Z01812 Encounter for preprocedural laboratory examination: Secondary | ICD-10-CM | POA: Diagnosis not present

## 2024-02-03 DIAGNOSIS — Z01818 Encounter for other preprocedural examination: Secondary | ICD-10-CM

## 2024-02-03 HISTORY — DX: Anxiety disorder, unspecified: F41.9

## 2024-02-03 HISTORY — DX: Prediabetes: R73.03

## 2024-02-03 HISTORY — DX: Unspecified osteoarthritis, unspecified site: M19.90

## 2024-02-03 HISTORY — DX: Hypothyroidism, unspecified: E03.9

## 2024-02-03 LAB — CBC WITH DIFFERENTIAL/PLATELET
Abs Immature Granulocytes: 0.02 K/uL (ref 0.00–0.07)
Basophils Absolute: 0 K/uL (ref 0.0–0.1)
Basophils Relative: 1 %
Eosinophils Absolute: 0.1 K/uL (ref 0.0–0.5)
Eosinophils Relative: 2 %
HCT: 43.4 % (ref 36.0–46.0)
Hemoglobin: 14.4 g/dL (ref 12.0–15.0)
Immature Granulocytes: 0 %
Lymphocytes Relative: 29 %
Lymphs Abs: 2.1 K/uL (ref 0.7–4.0)
MCH: 30.5 pg (ref 26.0–34.0)
MCHC: 33.2 g/dL (ref 30.0–36.0)
MCV: 91.9 fL (ref 80.0–100.0)
Monocytes Absolute: 0.6 K/uL (ref 0.1–1.0)
Monocytes Relative: 9 %
Neutro Abs: 4.1 K/uL (ref 1.7–7.7)
Neutrophils Relative %: 59 %
Platelets: 259 K/uL (ref 150–400)
RBC: 4.72 MIL/uL (ref 3.87–5.11)
RDW: 12.4 % (ref 11.5–15.5)
WBC: 7 K/uL (ref 4.0–10.5)
nRBC: 0 % (ref 0.0–0.2)

## 2024-02-03 LAB — COMPREHENSIVE METABOLIC PANEL WITH GFR
ALT: 62 U/L — ABNORMAL HIGH (ref 0–44)
AST: 55 U/L — ABNORMAL HIGH (ref 15–41)
Albumin: 4.5 g/dL (ref 3.5–5.0)
Alkaline Phosphatase: 84 U/L (ref 38–126)
Anion gap: 10 (ref 5–15)
BUN: 9 mg/dL (ref 6–20)
CO2: 25 mmol/L (ref 22–32)
Calcium: 9.7 mg/dL (ref 8.9–10.3)
Chloride: 104 mmol/L (ref 98–111)
Creatinine, Ser: 0.72 mg/dL (ref 0.44–1.00)
GFR, Estimated: >60 mL/min
Glucose, Bld: 93 mg/dL (ref 70–99)
Potassium: 4.2 mmol/L (ref 3.5–5.1)
Sodium: 140 mmol/L (ref 135–145)
Total Bilirubin: 0.4 mg/dL (ref 0.0–1.2)
Total Protein: 7.9 g/dL (ref 6.5–8.1)

## 2024-02-03 LAB — TYPE AND SCREEN
ABO/RH(D): A POS
Antibody Screen: NEGATIVE

## 2024-02-03 NOTE — Progress Notes (Signed)
 For Anesthesia: PCP - Jolinda Norene HERO, DO  Cardiologist - Hobart, Heather  E, MD . ARNETTA: 08/23/21  Bowel Prep reminder:N/A  Chest x-ray - 10/08/23 EKG - 10/08/23 Stress Test -  ECHO - 09/09/21 Cardiac Cath -  Pacemaker/ICD device last checked: Pacemaker orders received: Device Rep notified:  Spinal Cord Stimulator:N/A  Sleep Study - N/A CPAP -   Fasting Blood Sugar - N/A Checks Blood Sugar _____ times a day Date and result of last Hgb A1c-  Last dose of GLP1 agonist- N/A GLP1 instructions: Hold 7 days prior to schedule (Hold 24 hours-daily)   Last dose of SGLT-2 inhibitors- N/A SGLT-2 instructions: Hold 72 hours prior to surgery  Blood Thinner Instructions:N/A Last Dose: Time last taken:  Aspirin  Instructions:N/A Last Dose: Time last taken:  Activity level: Can go up a flight of stairs and activities of daily living without stopping and without chest pain and/or shortness of breath   Able to exercise without chest pain and/or shortness of breath     Anesthesia review: Hx: HTN,SVT (during pregnancy), Pre-DIA.  Patient denies shortness of breath, fever, cough and chest pain at PAT appointment   Patient verbalized understanding of instructions that were reviewed over the telephone.

## 2024-02-12 ENCOUNTER — Other Ambulatory Visit (HOSPITAL_COMMUNITY): Payer: Self-pay

## 2024-02-12 ENCOUNTER — Telehealth: Payer: Self-pay | Admitting: Dietician

## 2024-02-12 ENCOUNTER — Other Ambulatory Visit: Payer: Self-pay

## 2024-02-12 NOTE — Telephone Encounter (Signed)
 Patient called stating she misplaced the information from the pre-op class. Pt is scheduled for surgery on Monday 02/15/2024. Dietitian sent slide presentation (pdf format) of the pre-op class via email. Pt is scheduled for post-op class two weeks after surgery on 03/01/2024.

## 2024-02-15 ENCOUNTER — Other Ambulatory Visit: Payer: Self-pay

## 2024-02-15 ENCOUNTER — Telehealth (HOSPITAL_COMMUNITY): Payer: Self-pay | Admitting: Pharmacy Technician

## 2024-02-15 ENCOUNTER — Inpatient Hospital Stay (HOSPITAL_COMMUNITY): Payer: Self-pay | Admitting: Medical

## 2024-02-15 ENCOUNTER — Inpatient Hospital Stay (HOSPITAL_COMMUNITY)
Admission: RE | Admit: 2024-02-15 | Discharge: 2024-02-16 | DRG: 621 | Disposition: A | Discharge status: Home / Self Care | Attending: General Surgery | Admitting: General Surgery

## 2024-02-15 ENCOUNTER — Other Ambulatory Visit (HOSPITAL_COMMUNITY): Payer: Self-pay

## 2024-02-15 ENCOUNTER — Encounter (HOSPITAL_COMMUNITY)
Admission: RE | Disposition: A | Discharge status: Home / Self Care | Payer: Self-pay | Source: Home / Self Care | Attending: General Surgery

## 2024-02-15 ENCOUNTER — Inpatient Hospital Stay (HOSPITAL_COMMUNITY): Admitting: Certified Registered"

## 2024-02-15 ENCOUNTER — Encounter (HOSPITAL_COMMUNITY): Payer: Self-pay | Admitting: General Surgery

## 2024-02-15 DIAGNOSIS — Z79890 Hormone replacement therapy (postmenopausal): Secondary | ICD-10-CM

## 2024-02-15 DIAGNOSIS — Z6841 Body Mass Index (BMI) 40.0 and over, adult: Secondary | ICD-10-CM

## 2024-02-15 DIAGNOSIS — M2559 Pain in other specified joint: Secondary | ICD-10-CM | POA: Diagnosis present

## 2024-02-15 DIAGNOSIS — Z8349 Family history of other endocrine, nutritional and metabolic diseases: Secondary | ICD-10-CM | POA: Diagnosis not present

## 2024-02-15 DIAGNOSIS — E66813 Obesity, class 3: Secondary | ICD-10-CM | POA: Diagnosis present

## 2024-02-15 DIAGNOSIS — Z825 Family history of asthma and other chronic lower respiratory diseases: Secondary | ICD-10-CM

## 2024-02-15 DIAGNOSIS — F419 Anxiety disorder, unspecified: Secondary | ICD-10-CM | POA: Diagnosis present

## 2024-02-15 DIAGNOSIS — M1990 Unspecified osteoarthritis, unspecified site: Secondary | ICD-10-CM | POA: Diagnosis present

## 2024-02-15 DIAGNOSIS — E781 Pure hyperglyceridemia: Secondary | ICD-10-CM | POA: Diagnosis present

## 2024-02-15 DIAGNOSIS — Z882 Allergy status to sulfonamides status: Secondary | ICD-10-CM | POA: Diagnosis not present

## 2024-02-15 DIAGNOSIS — R7303 Prediabetes: Secondary | ICD-10-CM | POA: Diagnosis present

## 2024-02-15 DIAGNOSIS — K760 Fatty (change of) liver, not elsewhere classified: Secondary | ICD-10-CM | POA: Diagnosis present

## 2024-02-15 DIAGNOSIS — Z83438 Family history of other disorder of lipoprotein metabolism and other lipidemia: Secondary | ICD-10-CM

## 2024-02-15 DIAGNOSIS — Z881 Allergy status to other antibiotic agents status: Secondary | ICD-10-CM | POA: Diagnosis not present

## 2024-02-15 DIAGNOSIS — E559 Vitamin D deficiency, unspecified: Secondary | ICD-10-CM | POA: Diagnosis present

## 2024-02-15 DIAGNOSIS — E039 Hypothyroidism, unspecified: Secondary | ICD-10-CM | POA: Diagnosis present

## 2024-02-15 DIAGNOSIS — Z7901 Long term (current) use of anticoagulants: Secondary | ICD-10-CM | POA: Diagnosis not present

## 2024-02-15 DIAGNOSIS — Z91018 Allergy to other foods: Secondary | ICD-10-CM

## 2024-02-15 DIAGNOSIS — Z01818 Encounter for other preprocedural examination: Secondary | ICD-10-CM

## 2024-02-15 DIAGNOSIS — Z8249 Family history of ischemic heart disease and other diseases of the circulatory system: Secondary | ICD-10-CM

## 2024-02-15 DIAGNOSIS — Z975 Presence of (intrauterine) contraceptive device: Secondary | ICD-10-CM | POA: Diagnosis not present

## 2024-02-15 DIAGNOSIS — Z883 Allergy status to other anti-infective agents status: Secondary | ICD-10-CM

## 2024-02-15 DIAGNOSIS — Z79899 Other long term (current) drug therapy: Secondary | ICD-10-CM

## 2024-02-15 DIAGNOSIS — Z9884 Bariatric surgery status: Principal | ICD-10-CM

## 2024-02-15 DIAGNOSIS — Z833 Family history of diabetes mellitus: Secondary | ICD-10-CM | POA: Diagnosis not present

## 2024-02-15 HISTORY — PX: LAPAROSCOPIC GASTRIC SLEEVE RESECTION: SHX5895

## 2024-02-15 HISTORY — PX: UPPER GI ENDOSCOPY: SHX6162

## 2024-02-15 LAB — CREATININE, SERUM
Creatinine, Ser: 0.94 mg/dL (ref 0.44–1.00)
GFR, Estimated: >60 mL/min

## 2024-02-15 LAB — HEMOGLOBIN AND HEMATOCRIT, BLOOD
HCT: 43.1 % (ref 36.0–46.0)
Hemoglobin: 14.6 g/dL (ref 12.0–15.0)

## 2024-02-15 LAB — POCT PREGNANCY, URINE: Preg Test, Ur: NEGATIVE

## 2024-02-15 SURGERY — GASTRECTOMY, SLEEVE, LAPAROSCOPIC
Anesthesia: General

## 2024-02-15 MED ORDER — SUGAMMADEX SODIUM 200 MG/2ML IV SOLN
INTRAVENOUS | Status: DC | PRN
  Administered 2024-02-15: 250 mg via INTRAVENOUS

## 2024-02-15 MED ORDER — DIPHENHYDRAMINE HCL 25 MG PO CAPS
25.0000 mg | ORAL_CAPSULE | Freq: Four times a day (QID) | ORAL | Status: AC | PRN
  Administered 2024-02-15: 25 mg via ORAL

## 2024-02-15 MED ORDER — ROCURONIUM BROMIDE 10 MG/ML (PF) SYRINGE
PREFILLED_SYRINGE | INTRAVENOUS | Status: AC
  Filled 2024-02-15: qty 10

## 2024-02-15 MED ORDER — ACETAMINOPHEN 160 MG/5ML PO SOLN
1000.0000 mg | Freq: Three times a day (TID) | ORAL | Status: DC
  Administered 2024-02-15 – 2024-02-16 (×2): 1000 mg via ORAL
  Filled 2024-02-15 (×2): qty 40.6

## 2024-02-15 MED ORDER — CHLORHEXIDINE GLUCONATE 4 % EX SOLN: Freq: Once | CUTANEOUS | Status: DC

## 2024-02-15 MED ORDER — LEVOTHYROXINE SODIUM 50 MCG PO TABS
50.0000 ug | ORAL_TABLET | Freq: Every day | ORAL | Status: DC
  Administered 2024-02-16: 50 ug via ORAL
  Filled 2024-02-15: qty 1

## 2024-02-15 MED ORDER — APREPITANT 40 MG PO CAPS
40.0000 mg | ORAL_CAPSULE | ORAL | Status: AC
  Administered 2024-02-15: 40 mg via ORAL
  Filled 2024-02-15: qty 1

## 2024-02-15 MED ORDER — FENTANYL CITRATE (PF) 250 MCG/5ML IJ SOLN
INTRAMUSCULAR | Status: AC
  Filled 2024-02-15: qty 5

## 2024-02-15 MED ORDER — SODIUM CHLORIDE 0.9 % IV SOLN
2.0000 g | INTRAVENOUS | Status: AC
  Administered 2024-02-15: 2 g via INTRAVENOUS
  Filled 2024-02-15: qty 2

## 2024-02-15 MED ORDER — SERTRALINE HCL 100 MG PO TABS
100.0000 mg | ORAL_TABLET | Freq: Every day | ORAL | Status: DC
  Administered 2024-02-16: 100 mg via ORAL
  Filled 2024-02-15: qty 1

## 2024-02-15 MED ORDER — 0.9 % SODIUM CHLORIDE (POUR BTL) OPTIME
TOPICAL | Status: DC | PRN
  Administered 2024-02-15: 1000 mL

## 2024-02-15 MED ORDER — OXYCODONE HCL 5 MG PO TABS: 5.0000 mg | ORAL_TABLET | Freq: Once | ORAL | Status: AC | PRN

## 2024-02-15 MED ORDER — OXYCODONE HCL 5 MG/5ML PO SOLN
ORAL | Status: AC
  Filled 2024-02-15: qty 5

## 2024-02-15 MED ORDER — ACETAMINOPHEN 10 MG/ML IV SOLN: 1000.0000 mg | Freq: Once | INTRAVENOUS | Status: DC | PRN

## 2024-02-15 MED ORDER — OXYCODONE HCL 5 MG/5ML PO SOLN
5.0000 mg | Freq: Once | ORAL | Status: AC | PRN
  Administered 2024-02-15: 5 mg via ORAL

## 2024-02-15 MED ORDER — ENOXAPARIN (LOVENOX) PATIENT EDUCATION KIT
PACK | Freq: Once | Status: DC
  Filled 2024-02-15: qty 1

## 2024-02-15 MED ORDER — STERILE WATER FOR IRRIGATION IR SOLN
Status: DC | PRN
  Administered 2024-02-15 (×2): 1000 mL

## 2024-02-15 MED ORDER — DIPHENHYDRAMINE HCL 25 MG PO CAPS
ORAL_CAPSULE | ORAL | Status: AC
  Filled 2024-02-15: qty 1

## 2024-02-15 MED ORDER — PHENYLEPHRINE HCL (PRESSORS) 10 MG/ML IV SOLN
INTRAVENOUS | Status: DC | PRN
  Administered 2024-02-15 (×2): 200 ug via INTRAVENOUS

## 2024-02-15 MED ORDER — LIDOCAINE 2% (20 MG/ML) 5 ML SYRINGE
INTRAMUSCULAR | Status: DC | PRN
  Administered 2024-02-15: 100 mg via INTRAVENOUS

## 2024-02-15 MED ORDER — ORAL CARE MOUTH RINSE: 15.0000 mL | Freq: Once | OROMUCOSAL | Status: AC

## 2024-02-15 MED ORDER — DEXAMETHASONE SOD PHOSPHATE PF 10 MG/ML IJ SOLN: 4.0000 mg | INTRAMUSCULAR | Status: DC

## 2024-02-15 MED ORDER — ACETAMINOPHEN 500 MG PO TABS
1000.0000 mg | ORAL_TABLET | ORAL | Status: AC
  Administered 2024-02-15: 1000 mg via ORAL
  Filled 2024-02-15: qty 2

## 2024-02-15 MED ORDER — SODIUM CHLORIDE 0.9 % IV SOLN
12.5000 mg | Freq: Four times a day (QID) | INTRAVENOUS | Status: DC | PRN
  Administered 2024-02-15: 12.5 mg via INTRAVENOUS
  Filled 2024-02-15: qty 12.5

## 2024-02-15 MED ORDER — BUPIVACAINE-EPINEPHRINE 0.25% -1:200000 IJ SOLN
INTRAMUSCULAR | Status: DC | PRN
  Administered 2024-02-15: 60 mL

## 2024-02-15 MED ORDER — ONDANSETRON HCL 4 MG/2ML IJ SOLN
INTRAMUSCULAR | Status: AC
  Filled 2024-02-15: qty 2

## 2024-02-15 MED ORDER — ONDANSETRON HCL 4 MG/2ML IJ SOLN
4.0000 mg | Freq: Four times a day (QID) | INTRAMUSCULAR | Status: DC | PRN
  Administered 2024-02-15: 4 mg via INTRAVENOUS
  Filled 2024-02-15 (×2): qty 2

## 2024-02-15 MED ORDER — ONDANSETRON HCL 4 MG/2ML IJ SOLN: 4.0000 mg | Freq: Once | INTRAMUSCULAR | Status: DC | PRN

## 2024-02-15 MED ORDER — SIMETHICONE 80 MG PO CHEW
80.0000 mg | CHEWABLE_TABLET | Freq: Four times a day (QID) | ORAL | Status: DC | PRN
  Administered 2024-02-15 – 2024-02-16 (×2): 80 mg via ORAL
  Filled 2024-02-15 (×2): qty 1

## 2024-02-15 MED ORDER — GABAPENTIN 100 MG PO CAPS
100.0000 mg | ORAL_CAPSULE | ORAL | Status: AC
  Administered 2024-02-15: 100 mg via ORAL
  Filled 2024-02-15: qty 1

## 2024-02-15 MED ORDER — LACTATED RINGERS IR SOLN
Status: DC | PRN
  Administered 2024-02-15: 1000 mL

## 2024-02-15 MED ORDER — LACTATED RINGERS IV SOLN: INTRAVENOUS | Status: DC

## 2024-02-15 MED ORDER — HEPARIN SODIUM (PORCINE) 5000 UNIT/ML IJ SOLN
5000.0000 [IU] | INTRAMUSCULAR | Status: AC
  Administered 2024-02-15: 5000 [IU] via SUBCUTANEOUS
  Filled 2024-02-15: qty 1

## 2024-02-15 MED ORDER — ACETAMINOPHEN 500 MG PO TABS
1000.0000 mg | ORAL_TABLET | Freq: Three times a day (TID) | ORAL | Status: DC
  Filled 2024-02-15: qty 2

## 2024-02-15 MED ORDER — KCL IN DEXTROSE-NACL 20-5-0.45 MEQ/L-%-% IV SOLN
INTRAVENOUS | Status: DC
  Filled 2024-02-15 (×3): qty 1000

## 2024-02-15 MED ORDER — DEXAMETHASONE SOD PHOSPHATE PF 10 MG/ML IJ SOLN
INTRAMUSCULAR | Status: DC | PRN
  Administered 2024-02-15: 10 mg via INTRAVENOUS

## 2024-02-15 MED ORDER — PROPOFOL 10 MG/ML IV BOLUS
INTRAVENOUS | Status: AC
  Filled 2024-02-15: qty 20

## 2024-02-15 MED ORDER — ONDANSETRON HCL 4 MG/2ML IJ SOLN
INTRAMUSCULAR | Status: DC | PRN
  Administered 2024-02-15: 4 mg via INTRAVENOUS

## 2024-02-15 MED ORDER — OXYCODONE HCL 5 MG/5ML PO SOLN: 5.0000 mg | Freq: Four times a day (QID) | ORAL | Status: DC | PRN

## 2024-02-15 MED ORDER — SCOPOLAMINE 1 MG/3DAYS TD PT72
1.0000 | MEDICATED_PATCH | TRANSDERMAL | Status: DC
  Administered 2024-02-15: 1 mg via TRANSDERMAL
  Filled 2024-02-15: qty 1

## 2024-02-15 MED ORDER — DEXMEDETOMIDINE HCL IN NACL 80 MCG/20ML IV SOLN
INTRAVENOUS | Status: DC | PRN
  Administered 2024-02-15 (×2): 10 ug via INTRAVENOUS

## 2024-02-15 MED ORDER — GABAPENTIN 100 MG PO CAPS
100.0000 mg | ORAL_CAPSULE | Freq: Two times a day (BID) | ORAL | Status: DC
  Administered 2024-02-15 – 2024-02-16 (×2): 100 mg via ORAL
  Filled 2024-02-15 (×2): qty 1

## 2024-02-15 MED ORDER — FENTANYL CITRATE (PF) 50 MCG/ML IJ SOSY
PREFILLED_SYRINGE | INTRAMUSCULAR | Status: AC
  Filled 2024-02-15: qty 1

## 2024-02-15 MED ORDER — CHLORHEXIDINE GLUCONATE 0.12 % MT SOLN
15.0000 mL | Freq: Once | OROMUCOSAL | Status: AC
  Administered 2024-02-15: 15 mL via OROMUCOSAL

## 2024-02-15 MED ORDER — ENSURE MAX PROTEIN PO LIQD
2.0000 [oz_av] | ORAL | Status: DC
  Administered 2024-02-16 (×4): 2 [oz_av] via ORAL

## 2024-02-15 MED ORDER — SUGAMMADEX SODIUM 200 MG/2ML IV SOLN
INTRAVENOUS | Status: AC
  Filled 2024-02-15: qty 4

## 2024-02-15 MED ORDER — PROPOFOL 10 MG/ML IV BOLUS
INTRAVENOUS | Status: DC | PRN
  Administered 2024-02-15: 200 mg via INTRAVENOUS
  Administered 2024-02-15: 25 ug/kg/min via INTRAVENOUS

## 2024-02-15 MED ORDER — BUPIVACAINE-EPINEPHRINE (PF) 0.25% -1:200000 IJ SOLN
INTRAMUSCULAR | Status: AC
  Filled 2024-02-15: qty 60

## 2024-02-15 MED ORDER — MIDAZOLAM HCL 2 MG/2ML IJ SOLN
INTRAMUSCULAR | Status: AC
  Filled 2024-02-15: qty 2

## 2024-02-15 MED ORDER — LIDOCAINE HCL (PF) 2 % IJ SOLN
INTRAMUSCULAR | Status: AC
  Filled 2024-02-15: qty 5

## 2024-02-15 MED ORDER — MIDAZOLAM HCL (PF) 2 MG/2ML IJ SOLN
INTRAMUSCULAR | Status: DC | PRN
  Administered 2024-02-15: 2 mg via INTRAVENOUS

## 2024-02-15 MED ORDER — HEPARIN SODIUM (PORCINE) 5000 UNIT/ML IJ SOLN
5000.0000 [IU] | Freq: Three times a day (TID) | INTRAMUSCULAR | Status: DC
  Administered 2024-02-15 – 2024-02-16 (×2): 5000 [IU] via SUBCUTANEOUS
  Filled 2024-02-15 (×2): qty 1

## 2024-02-15 MED ORDER — ROCURONIUM 10MG/ML (10ML) SYRINGE FOR MEDFUSION PUMP - OPTIME
INTRAVENOUS | Status: DC | PRN
  Administered 2024-02-15: 70 mg via INTRAVENOUS

## 2024-02-15 MED ORDER — MORPHINE SULFATE (PF) 2 MG/ML IV SOLN: 1.0000 mg | INTRAVENOUS | Status: DC | PRN

## 2024-02-15 MED ORDER — PANTOPRAZOLE SODIUM 40 MG IV SOLR
40.0000 mg | Freq: Every day | INTRAVENOUS | Status: DC
  Administered 2024-02-15: 40 mg via INTRAVENOUS
  Filled 2024-02-15: qty 10

## 2024-02-15 MED ORDER — FENTANYL CITRATE (PF) 50 MCG/ML IJ SOSY
25.0000 ug | PREFILLED_SYRINGE | INTRAMUSCULAR | Status: DC | PRN
  Administered 2024-02-15: 50 ug via INTRAVENOUS

## 2024-02-15 MED ORDER — FENTANYL CITRATE (PF) 250 MCG/5ML IJ SOLN
INTRAMUSCULAR | Status: DC | PRN
  Administered 2024-02-15: 100 ug via INTRAVENOUS
  Administered 2024-02-15 (×3): 50 ug via INTRAVENOUS

## 2024-02-15 SURGICAL SUPPLY — 74 items
APPLICATOR COTTON TIP 6 STRL (MISCELLANEOUS) ×1 IMPLANT
BAG COUNTER SPONGE SURGICOUNT (BAG) IMPLANT
BLADE EXTENDED COATED 6.5IN (ELECTRODE) IMPLANT
BLADE HEX COATED 2.75 (ELECTRODE) ×1 IMPLANT
BLADE SURG SZ11 CARB STEEL (BLADE) ×1 IMPLANT
CHLORAPREP W/TINT 26 (MISCELLANEOUS) ×2 IMPLANT
CLIP APPLIE ROT 10 11.4 M/L (STAPLE) IMPLANT
CLIP APPLIE ROT 13.4 12 LRG (CLIP) IMPLANT
COVER MAYO STAND STRL (DRAPES) IMPLANT
COVER SURGICAL LIGHT HANDLE (MISCELLANEOUS) ×1 IMPLANT
DEVICE SUT QUICK LOAD TK 5 (SUTURE) IMPLANT
DEVICE SUT TI-KNOT TK 5X26 (SUTURE) IMPLANT
DEVICE SUTURE ENDOST 10MM (ENDOMECHANICALS) IMPLANT
DISSECTOR BLUNT TIP ENDO 5MM (MISCELLANEOUS) IMPLANT
DRAIN PENROSE 0.5X18 (DRAIN) ×1 IMPLANT
DRAPE LAPAROSCOPIC ABDOMINAL (DRAPES) ×1 IMPLANT
DRAPE UTILITY XL STRL (DRAPES) ×2 IMPLANT
DRAPE WARM FLUID 44X44 (DRAPES) IMPLANT
DRSG TEGADERM 2-3/8X2-3/4 SM (GAUZE/BANDAGES/DRESSINGS) ×6 IMPLANT
ELECT REM PT RETURN 15FT ADLT (MISCELLANEOUS) ×1 IMPLANT
ELECTRODE L-HOOK LAP 45CM DISP (ELECTROSURGICAL) IMPLANT
GAUZE SPONGE 2X2 8PLY STRL LF (GAUZE/BANDAGES/DRESSINGS) IMPLANT
GAUZE SPONGE 4X4 12PLY STRL (GAUZE/BANDAGES/DRESSINGS) ×1 IMPLANT
GLOVE BIO SURGEON STRL SZ7.5 (GLOVE) ×1 IMPLANT
GLOVE INDICATOR 8.0 STRL GRN (GLOVE) ×1 IMPLANT
GOWN STRL REUS W/ TWL XL LVL3 (GOWN DISPOSABLE) ×2 IMPLANT
GRASPER SUT TROCAR 14GX15 (MISCELLANEOUS) ×1 IMPLANT
HANDLE SUCTION POOLE (INSTRUMENTS) IMPLANT
IRRIGATION SUCT STRKRFLW 2 WTP (MISCELLANEOUS) ×1 IMPLANT
KIT BASIN OR (CUSTOM PROCEDURE TRAY) ×1 IMPLANT
KIT TURNOVER KIT A (KITS) ×1 IMPLANT
MARKER SKIN DUAL TIP RULER LAB (MISCELLANEOUS) ×1 IMPLANT
MAT PREVALON FULL STRYKER (MISCELLANEOUS) ×1 IMPLANT
NEEDLE SPNL 22GX3.5 QUINCKE BK (NEEDLE) ×1 IMPLANT
NS IRRIG 1000ML POUR BTL (IV SOLUTION) ×1 IMPLANT
PACK GENERAL/GYN (CUSTOM PROCEDURE TRAY) ×1 IMPLANT
PACK UNIVERSAL I (CUSTOM PROCEDURE TRAY) ×1 IMPLANT
RELOAD STAPLE 60 3.6 BLU REG (STAPLE) ×1 IMPLANT
RELOAD STAPLE 60 3.8 GOLD REG (STAPLE) IMPLANT
RELOAD STAPLE 60 4.1 GRN THCK (STAPLE) ×1 IMPLANT
RELOAD STAPLE 60 BLK VRY/THCK (STAPLE) IMPLANT
SCISSORS LAP 5X45 EPIX DISP (ENDOMECHANICALS) IMPLANT
SEALANT SURGICAL APPL DUAL CAN (MISCELLANEOUS) IMPLANT
SET TUBE SMOKE EVAC HIGH FLOW (TUBING) ×1 IMPLANT
SHEARS HARMONIC 45 ACE (MISCELLANEOUS) ×1 IMPLANT
SLEEVE ADV FIXATION 5X100MM (TROCAR) ×2 IMPLANT
SLEEVE GASTRECTOMY 40FR VISIGI (MISCELLANEOUS) ×1 IMPLANT
SOLUTION ANTFG W/FOAM PAD STRL (MISCELLANEOUS) ×1 IMPLANT
SPIKE FLUID TRANSFER (MISCELLANEOUS) ×1 IMPLANT
STAPLE LINE REINFORCEMENT LAP (STAPLE) IMPLANT
STAPLER ECHELON BIOABSB 60 FLE (MISCELLANEOUS) IMPLANT
STAPLER ECHELON LONG 3000 60 (ENDOMECHANICALS) IMPLANT
STAPLER ECHELON LONG 60 440 (INSTRUMENTS) IMPLANT
STAPLER SKIN PROX 35W (STAPLE) ×1 IMPLANT
STRIP CLOSURE SKIN 1/2X4 (GAUZE/BANDAGES/DRESSINGS) ×1 IMPLANT
SUT MNCRL AB 4-0 PS2 18 (SUTURE) ×1 IMPLANT
SUT SILK 2 0 SH CR/8 (SUTURE) IMPLANT
SUT SILK 2-0 18XBRD TIE 12 (SUTURE) IMPLANT
SUT SILK 3 0 SH CR/8 (SUTURE) IMPLANT
SUT SILK 3-0 18XBRD TIE 12 (SUTURE) IMPLANT
SUT SURGIDAC NAB ES-9 0 48 120 (SUTURE) IMPLANT
SUT VICRYL 0 TIES 12 18 (SUTURE) ×1 IMPLANT
SUT VICRYL 2 0 18 UND BR (SUTURE) IMPLANT
SYR 20ML LL LF (SYRINGE) ×1 IMPLANT
SYR 50ML LL SCALE MARK (SYRINGE) ×1 IMPLANT
SYSTEM KII OPTICAL ACCESS 15MM (TROCAR) ×1 IMPLANT
TOWEL OR DSP ST BLU DLX 10/PK (DISPOSABLE) ×2 IMPLANT
TRAY FOLEY MTR SLVR 16FR STAT (SET/KITS/TRAYS/PACK) IMPLANT
TROCAR ADV FIXATION 5X100MM (TROCAR) ×1 IMPLANT
TROCAR XCEL NON-BLD 5MMX100MML (ENDOMECHANICALS) ×1 IMPLANT
TROCAR Z-THREAD OPTICAL 5X100M (TROCAR) IMPLANT
TUBING CONNECTING 10 (TUBING) ×2 IMPLANT
TUBING ENDO SMARTCAP (MISCELLANEOUS) ×1 IMPLANT
YANKAUER SUCT BULB TIP NO VENT (SUCTIONS) IMPLANT

## 2024-02-15 NOTE — Telephone Encounter (Signed)
 Patient Product/process Development Scientist completed.    The patient is insured through [facility]. Patient has [employer], may use a copay card, and/or apply for patient assistance if available.    Ran test claim for enoxaparin  (Lovenox ) 40 mg/0.4 ml and the current 30 day co-pay is $5.00.   This test claim was processed through [facility]- copay amounts may vary at other pharmacies due to pharmacy/plan contracts, or as the patient moves through the different stages of their insurance plan.     Reyes Sharps, CPHT Pharmacy Technician Patient Advocate Specialist Lead [facility] Patient Advocate Team Direct Number: [phone]  Fax: [phone]

## 2024-02-15 NOTE — Anesthesia Preprocedure Evaluation (Addendum)
"                                    Anesthesia Evaluation  Patient identified by MRN, date of birth, ID band Patient awake    Reviewed: Allergy & Precautions, NPO status , Patient's Chart, lab work & pertinent test results, reviewed documented beta blocker date and time   History of Anesthesia Complications (+) PONV and history of anesthetic complications  Airway Mallampati: III  TM Distance: >3 FB     Dental no notable dental hx.    Pulmonary neg COPD   breath sounds clear to auscultation       Cardiovascular (-) hypertension(-) Past MI, (-) Cardiac Stents and (-) CABG  Rhythm:Regular Rate:Normal  IMPRESSIONS     1. Left ventricular ejection fraction by 3D volume is 55 %. The left  ventricle has normal function. The left ventricle has no regional wall  motion abnormalities. Left ventricular diastolic parameters were normal.   2. Right ventricular systolic function is normal. The right ventricular  size is normal. There is normal pulmonary artery systolic pressure.   3. The mitral valve is normal in structure. Trivial mitral valve  regurgitation. No evidence of mitral stenosis.   4. The aortic valve is normal in structure. Aortic valve regurgitation is  not visualized. No aortic stenosis is present.   5. The inferior vena cava is normal in size with greater than 50%  respiratory variability, suggesting right atrial pressure of 3 mmHg.     Neuro/Psych neg Seizures  Anxiety        GI/Hepatic ,,,(+) neg Cirrhosis        Endo/Other  Hypothyroidism  Class 3 obesity  Renal/GU Renal disease     Musculoskeletal  (+) Arthritis , Osteoarthritis,    Abdominal   Peds  Hematology   Anesthesia Other Findings   Reproductive/Obstetrics                              Anesthesia Physical Anesthesia Plan  ASA: 2  Anesthesia Plan: General   Post-op Pain Management:    Induction: Intravenous  PONV Risk Score and Plan: 4 or  greater and Propofol  infusion, Ondansetron , Dexamethasone  and Scopolamine  patch - Pre-op  Airway Management Planned: Video Laryngoscope Planned and Oral ETT  Additional Equipment:   Intra-op Plan:   Post-operative Plan: Extubation in OR  Informed Consent: I have reviewed the patients History and Physical, chart, labs and discussed the procedure including the risks, benefits and alternatives for the proposed anesthesia with the patient or authorized representative who has indicated his/her understanding and acceptance.     Dental advisory given  Plan Discussed with: CRNA  Anesthesia Plan Comments:          Anesthesia Quick Evaluation  "

## 2024-02-15 NOTE — Transfer of Care (Signed)
 Immediate Anesthesia Transfer of Care Note  Patient: Raven [name]  Procedure(s) Performed: GASTRECTOMY, SLEEVE, LAPAROSCOPIC ENDOSCOPY, UPPER GI TRACT  Patient Location: PACU  Anesthesia Type:General  Level of Consciousness: drowsy and patient cooperative  Airway & Oxygen Therapy: Patient Spontanous Breathing  Post-op Assessment: Report given to RN and Post -op Vital signs reviewed and stable  Post vital signs: Reviewed and stable  Last Vitals:  Vitals Value Taken Time  BP    Temp    Pulse 108 02/15/24 10:45  Resp 18 02/15/24 10:45  SpO2 93 % 02/15/24 10:45  Vitals shown include unfiled device data.  Last Pain:  Vitals:   02/15/24 0703  TempSrc:   PainSc: 0-No pain         Complications: No notable events documented.

## 2024-02-15 NOTE — Anesthesia Procedure Notes (Signed)
 Procedure Name: Intubation Date/Time: 02/15/2024 9:09 AM  Performed by: Nanci Riis, CRNAPre-anesthesia Checklist: Patient identified, Emergency Drugs available, Suction available, Patient being monitored and Timeout performed Patient Re-evaluated:Patient Re-evaluated prior to induction Oxygen Delivery Method: Circle system utilized Preoxygenation: Pre-oxygenation with 100% oxygen Induction Type: IV induction Ventilation: Mask ventilation without difficulty Laryngoscope Size: Miller and 3 Grade View: Grade I Tube type: Oral Tube size: 7.0 mm Number of attempts: 1 Airway Equipment and Method: Stylet Placement Confirmation: ETT inserted through vocal cords under direct vision, positive ETCO2 and breath sounds checked- equal and bilateral Secured at: 21 cm Tube secured with: Tape Dental Injury: Teeth and Oropharynx as per pre-operative assessment

## 2024-02-15 NOTE — Op Note (Signed)
 Preoperative diagnosis: laparoscopic sleeve gastrectomy  Postoperative diagnosis: Same   Procedure: Upper endoscopy   Surgeon: Mitzie DELENA Freund, M.D.  Anesthesia: Gen.   Description of procedure: The endoscope was placed in the mouth and oropharynx and under endoscopic vision it was advanced to the esophagogastric junction which was identified at 38cm from the teeth.  The sleeve was tensely insufflated while the upper abdomen was flooded with irrigation to perform a leak test, which was negative. No bubbles were seen.  The staple line was hemostatic and the lumen was evenly tubular without undue narrowing, angulation or twisting specifically at the incisura angularis. There is no retained fundus. The lumen was decompressed and the scope was withdrawn without difficulty.    Mitzie DELENA Freund, M.D. General, Bariatric, & Minimally Invasive Surgery [facility]

## 2024-02-15 NOTE — Progress Notes (Signed)
" ° ° °  Name: Raven [name]         Patient MRN: [id number]  DOA: 02/15/2024   Patient seen in room PACU 10  Pt alert and oriented. Pt lying on stretcher, main c/o of gas discomfort but otherwise pt expressed eagerness to get up and move around upon arrival to [facility].    Vital signs in last 24 hours: Today's Vitals   02/15/24 1245 02/15/24 1300 02/15/24 1302 02/15/24 1323  BP: 128/89 (!) 134/95  (!) 130/91  Pulse: (!) 105 (!) 105  (!) 108  Resp: 18 16 19 18   Temp:  97.8 F (36.6 C)  97.8 F (36.6 C)  TempSrc:    Oral  SpO2: 100% 100%  98%  Weight:      Height:      PainSc: 0-No pain 0-No pain     Body mass index is 41.2 kg/m.    Discussed QI Goals for Discharge document with patient including ambulation in halls, Incentive Spirometry use every hour, and oral care.  Also discussed pain and nausea control.  Enabled or verified head of bed 30 degree alarm activated.  BSTOP education provided including BSTOP information guide, Guide for Pain Management after your Bariatric Procedure.  Diet progression education provided including Bariatric Surgery Post-Op Food Plan Phase 1: Liquids.  Questions answered.  Will continue to partner with bedside RN and follow up with patient per protocol.       Thank you,  Roseann Medley, RN, MSN Bariatric Nurse Coordinator [phone] (office)       "

## 2024-02-15 NOTE — Plan of Care (Signed)
" °  Problem: Education: Goal: Knowledge of the prescribed therapeutic regimen will improve Outcome: Progressing   Problem: Bowel/Gastric: Goal: Gastrointestinal status for postoperative course will improve Outcome: Progressing   Problem: Cardiac: Goal: Ability to maintain an adequate cardiac output Outcome: Progressing Goal: Will show no evidence of cardiac arrhythmias Outcome: Progressing   Problem: Nutritional: Goal: Will attain and maintain optimal nutritional status Outcome: Progressing   Problem: Neurological: Goal: Will regain or maintain usual level of consciousness Outcome: Progressing   Problem: Clinical Measurements: Goal: Ability to maintain clinical measurements within normal limits Outcome: Progressing Goal: Postoperative complications will be avoided or minimized Outcome: Progressing   Problem: Respiratory: Goal: Will regain and/or maintain adequate ventilation Outcome: Progressing Goal: Respiratory status will improve Outcome: Progressing   Problem: Skin Integrity: Goal: Demonstrates signs of wound healing without infection Outcome: Progressing   Problem: Urinary Elimination: Goal: Will remain free from infection Outcome: Progressing Goal: Ability to achieve and maintain adequate urine output Outcome: Progressing   Problem: Education: Goal: Knowledge of General Education information will improve Description: Including pain rating scale, medication(s)/side effects and non-pharmacologic comfort measures Outcome: Progressing   Problem: Health Behavior/Discharge Planning: Goal: Ability to manage health-related needs will improve Outcome: Progressing   Problem: Clinical Measurements: Goal: Ability to maintain clinical measurements within normal limits will improve Outcome: Progressing Goal: Will remain free from infection Outcome: Progressing Goal: Diagnostic test results will improve Outcome: Progressing Goal: Respiratory complications will  improve Outcome: Progressing Goal: Cardiovascular complication will be avoided Outcome: Progressing   Problem: Activity: Goal: Risk for activity intolerance will decrease Outcome: Progressing   Problem: Nutrition: Goal: Adequate nutrition will be maintained Outcome: Progressing   Problem: Coping: Goal: Level of anxiety will decrease Outcome: Progressing   Problem: Elimination: Goal: Will not experience complications related to bowel motility Outcome: Progressing Goal: Will not experience complications related to urinary retention Outcome: Progressing   Problem: Pain Managment: Goal: General experience of comfort will improve and/or be controlled Outcome: Progressing   Problem: Safety: Goal: Ability to remain free from injury will improve Outcome: Progressing   Problem: Skin Integrity: Goal: Risk for impaired skin integrity will decrease Outcome: Progressing   Problem: Education: Goal: Ability to state signs and symptoms to report to health care provider will improve Outcome: Progressing Goal: Knowledge of the prescribed self-care regimen will improve Outcome: Progressing Goal: Knowledge of discharge needs will improve Outcome: Progressing   Problem: Activity: Goal: Ability to tolerate increased activity will improve Outcome: Progressing   Problem: Bowel/Gastric: Goal: Gastrointestinal status for postoperative course will improve Outcome: Progressing Goal: Occurrences of nausea will decrease Outcome: Progressing   Problem: Coping: Goal: Development of coping mechanisms to deal with changes in body function or appearance will improve Outcome: Progressing   Problem: Fluid Volume: Goal: Maintenance of adequate hydration will improve Outcome: Progressing   Problem: Nutritional: Goal: Nutritional status will improve Outcome: Progressing   Problem: Clinical Measurements: Goal: Will show no signs or symptoms of venous thromboembolism Outcome:  Progressing Goal: Will remain free from infection Outcome: Progressing Goal: Will show no signs of GI Leak Outcome: Progressing   Problem: Respiratory: Goal: Will regain and/or maintain adequate ventilation Outcome: Progressing   Problem: Pain Management: Goal: Pain level will decrease Outcome: Progressing   Problem: Skin Integrity: Goal: Demonstration of wound healing without infection will improve Outcome: Progressing   "

## 2024-02-15 NOTE — H&P (Signed)
 " CC: here for surgery  Requesting provider: no  HPI: Raven [name] is an 34 y.o. female who is here for laparoscopic sleeve gastrectomy with possible hiatal hernia repair  Last seen in the clinic in mid November.  She denies any medical changes since I last saw her.  Old hpi: She has been adhering to the preoperative liver diet, which involves replacing one meal a day with a protein shake and consuming high protein, low carbohydrate meals. She is concerned about adhering to the diet but understands that perfection is not required.  No new medical issues have arisen since her last visit in the summer, including no trips to the emergency room or hospitalizations. No chest pain, chest pressure, shortness of breath, or gastrointestinal symptoms such as GERD or dysphagia. Bowel movements are regular.  Recent workup included a chest X-ray and EKG. An upper GI study was performed. Blood work revealed a vitamin D  deficiency, for which she is taking supplements, and a slightly elevated A1c of 5.8. Her lipid panel was reviewed.  She has hearing aids and has seen an audiologist for hearing issues. She also had an annual exam with her primary care physician in early September.  She is concerned about postoperative restrictions, particularly regarding lifting her child. She plans to return to remote work one week after surgery.   Past Medical History:  Diagnosis Date   Anxiety    Arthritis    Autoimmune disease 07/25/2020   Complication of anesthesia    Elevated liver enzymes    Endometriosis    Hypothyroidism    Medical history non-contributory    Otosclerosis    PONV (postoperative nausea and vomiting)    Pre-diabetes    Preeclampsia in postpartum period 02/11/2022   SVT (supraventricular tachycardia)    Tachycardia     Past Surgical History:  Procedure Laterality Date   CESAREAN SECTION N/A 06/14/2014   Procedure: CESAREAN SECTION;  Surgeon: Rosaline Luna, MD;  Location: [facility]  ORS;  Service: Obstetrics;  Laterality: N/A;   CESAREAN SECTION WITH BILATERAL TUBAL LIGATION Bilateral 01/30/2022   Procedure: REPEAT CESAREAN SECTION WITH BILATERAL TUBAL LIGATION EDC: 02-05-22 ALLERG: HYDROXYCHLOROQUINE , SULFA, WALNUT  PREVIOUS X 1;  Surgeon: Dannielle Bouchard, DO;  Location: [facility] LD ORS;  Service: Obstetrics;  Laterality: Bilateral;   EXCISION MASS ABDOMINAL N/A 10/21/2019   Procedure: EXCISION MASS ABDOMINAL;  Surgeon: Signe Mitzie LABOR, MD;  Location: [facility] ORS;  Service: General;  Laterality: N/A;   LAPAROSCOPIC REMOVAL OF MESENTERIC MASS     endometrio   stebactomy Left     Family History  Problem Relation Age of Onset   Other Mother        Elevated liver enzymes   Diabetes Mother    Hypertension Mother    Thyroid  disease Mother    Diabetes Father    Hypertension Father    Hyperlipidemia Father    Other Brother        Elevated liver enzymes   Selective mutism Son    Chromosomal disorder Son    Asthma Paternal Aunt    Asthma Paternal Grandmother    Allergic rhinitis Neg Hx    Eczema Neg Hx    Urticaria Neg Hx     Social:  reports that she has never smoked. She has never been exposed to tobacco smoke. She has never used smokeless tobacco. She reports that she does not drink alcohol and does not use drugs.  Allergies: Allergies[1]  Medications: I have reviewed the patient's current  medications.   ROS - all of the below systems have been reviewed with the patient and positives are indicated with bold text General: chills, fever or night sweats Eyes: blurry vision or double vision ENT: epistaxis or sore throat Allergy/Immunology: itchy/watery eyes or nasal congestion Hematologic/Lymphatic: bleeding problems, blood clots or swollen lymph nodes Endocrine: temperature intolerance or unexpected weight changes Breast: new or changing breast lumps or nipple discharge Resp: cough, shortness of breath, or wheezing CV: chest pain or dyspnea on exertion GI: as per  HPI GU: dysuria, trouble voiding, or hematuria MSK: joint pain or joint stiffness Neuro: TIA or stroke symptoms Derm: pruritus and skin lesion changes Psych: anxiety and depression  PE Blood pressure (!) 137/95, pulse 99, temperature 98.8 F (37.1 C), temperature source Oral, resp. rate 20, height 5' 4 (1.626 m), weight 108.9 kg, SpO2 99%, not currently breastfeeding. Constitutional: NAD; conversant; no deformities Eyes: Moist conjunctiva; no lid lag; anicteric; PERRL Neck: Trachea midline; no thyromegaly Lungs: Normal respiratory effort; no tactile fremitus CV: RRR; no palpable thrills; no pitting edema GI: Abd soft, nt, nd; no palpable hepatosplenomegaly MSK: Normal gait; no clubbing/cyanosis Psychiatric: Appropriate affect; alert and oriented x3 Lymphatic: No palpable cervical or axillary lymphadenopathy Skin:no rash  Results for orders placed or performed during the hospital encounter of 02/15/24 (from the past 48 hours)  Pregnancy, urine POC     Status: None   Collection Time: 02/15/24  6:48 AM  Result Value Ref Range   Preg Test, Ur NEGATIVE NEGATIVE    Comment:        THE SENSITIVITY OF THIS METHODOLOGY IS >20 mIU/mL.     No results found.  Imaging: 10/08/23- upper gi - tiny hiatal hernia; cxr ok  A/P: Raven [name] is an 34 y.o. female with  Vitamin D  insufficiency  Severe obesity (CMS-HCC)  Multiple joint pain  Prediabetes  Sliding hiatal hernia  Hypertriglyceridemia   2 OR for laparoscopic sleeve gastrectomy with possible hiatal hernia repair and upper endoscopy IV antibiotic on-call Enhanced recovery protocol Preoperative subcutaneous heparin   All questions asked and answered   Camellia HERO. Tanda, MD, FACS General, Bariatric, & Minimally Invasive Surgery [facility]      [1]  Allergies Allergen Reactions   Macrobid [Nitrofurantoin] Hives   Other     Walnuts - burns tongue    Sulfa Antibiotics  Dermatitis   Hydroxychloroquine  Rash and Hives   Sulfamethoxazole-Trimethoprim Rash   "

## 2024-02-15 NOTE — Discharge Instructions (Signed)

## 2024-02-15 NOTE — Anesthesia Postprocedure Evaluation (Signed)
"   Anesthesia Post Note  Patient: NOEL HENANDEZ  Procedure(s) Performed: GASTRECTOMY, SLEEVE, LAPAROSCOPIC ENDOSCOPY, UPPER GI TRACT     Patient location during evaluation: PACU Anesthesia Type: General Level of consciousness: awake and alert Pain management: pain level controlled Vital Signs Assessment: post-procedure vital signs reviewed and stable Respiratory status: spontaneous breathing, nonlabored ventilation, respiratory function stable and patient connected to nasal cannula oxygen Cardiovascular status: blood pressure returned to baseline and stable Postop Assessment: no apparent nausea or vomiting Anesthetic complications: no   No notable events documented.  Last Vitals:  Vitals:   02/15/24 1245 02/15/24 1323  BP: 128/89 (!) 130/91  Pulse: (!) 105 (!) 116  Resp: 18 18  Temp:  36.6 C  SpO2: 100% 98%    Last Pain:  Vitals:   02/15/24 1323  TempSrc: Oral  PainSc:                  Lynwood MARLA Cornea      "

## 2024-02-15 NOTE — Op Note (Signed)
 02/15/2024 Raven [name] Jun 10, 1990 [id number]   PRE-OPERATIVE DIAGNOSIS:   Severe obesity BMI 42 Vitamin D  insufficiency Multiple joint pain Prediabetes Possible Sliding hiatal hernia Hypertriglyceridemia   POST-OPERATIVE DIAGNOSIS:  Severe obesity BMI 42 Vitamin D  insufficiency Multiple joint pain Prediabetes Hypertriglyceridemia   + fatty liver  PROCEDURE:  Procedure(s): LAPAROSCOPIC SLEEVE GASTRECTOMY  UPPER GI ENDOSCOPY Laparoscopic bilateral TAP block  SURGEON:  Surgeon(s): Camellia CHRISTELLA Blush, MD FACS FASMBS  ASSISTANTS: Mitzie Freund MD FACS  ANESTHESIA:   general  DRAINS: none   BOUGIE: 40 fr ViSiGi  LOCAL MEDICATIONS USED:   bupivacaine   EBL: minimal   FINDINGS: No obvious evidence of sliding hiatal hernia.  Patient appeared to have hepatic steatosis.  SPECIMEN:  Source of Specimen:  Greater curvature of stomach  DISPOSITION OF SPECIMEN:  PATHOLOGY  COUNTS:  YES  INDICATION FOR PROCEDURE: This is a very pleasant 34 y.o.-year-old morbidly obese female who has had unsuccessful attempts for sustained weight loss. The patient presents today for a planned laparoscopic sleeve gastrectomy with upper endoscopy. We have discussed the risk and benefits of the procedure extensively preoperatively. Please see my separate notes.  PROCEDURE: After obtaining informed consent and receiving 5000 units of subcutaneous heparin , the patient was brought to the operating room at [facility] and placed supine on the operating room table. General endotracheal anesthesia was established. Sequential compression devices were placed. A orogastric tube was placed. The patient's abdomen was prepped and draped in the usual standard surgical fashion. The patient received preoperative IV antibiotic. A surgical timeout was performed. ERAS protocol used.   Access to the abdomen was achieved using a 5 mm 0 laparoscope thru a 5 mm trocar In the left upper Quadrant 2 fingerbreadths  below the left subcostal margin using the Optiview technique. Pneumoperitoneum was smoothly established up to 15 mm of mercury. The laparoscope was advanced and the abdominal cavity was surveilled. The patient was then placed in reverse Trendelenburg.   A 5 mm trocar was placed slightly above and to the left of the umbilicus under direct visualization.  The [facility] liver retractor was placed under the left lobe of the liver through a 5 mm trocar incision site in the subxiphoid position. A 5 mm trocar was placed in the lateral right upper quadrant along with a 15 mm trocar in the mid right abdomen. A final 5 mm trocar was placed in the lateral LUQ.  All under direct visualization after local had been infiltrated in bilateral lateral upper abdominal walls as a TAP block for postoperative pain relief.  The stomach was inspected. It was completely decompressed and the orogastric tube was removed.  There was no obvious sliding hiatal hernia.  No anterior dimple.  We identified the pylorus and measured 6 cm proximal to the pylorus and identified an area of where we would start taking down the short gastric vessels. Harmonic scalpel was used to take down the short gastric vessels along the greater curvature of the stomach. We were able to enter the lesser sac. We continued to march along the greater curvature of the stomach taking down the short gastrics. As we approached the gastrosplenic ligament we took care in this area not to injure the spleen. We were able to take down the entire gastrosplenic ligament.  One of the thin small caliber short gastric vessels essentially slid through the harmonic scalpel.  It was hemostatic however we decided to put a clip on it just to be on the safe side.  We  also clipped another short gastric vessel as well.  We then mobilized the fundus away from the left crus of diaphragm. There were not any significant posterior gastric avascular attachments. This left the stomach  completely mobilized. No vessels had been taken down along the lesser curvature of the stomach.  We then reidentified the pylorus. A 40Fr ViSiGi was then placed in the oropharynx and advanced down into the stomach and placed in the distal antrum and positioned along the lesser curvature. It was placed under suction which secured the 40Fr ViSiGi in place along the lesser curve. Then using the Ethicon echelon 60 mm stapler with a gold load with ethicon staple line reinforcement (ESLR), I placed a stapler along the antrum approximately 5 cm from the pylorus. The stapler was angled so that there is ample room at the angularis incisura. I then fired the first staple load after inspecting it posteriorly to ensure adequate space both anteriorly and posteriorly. At this point I still was not completely past the angularis so with a blue load with ESLR, I placed the stapler in position just inside the prior stapleline. We then rotated the stomach to insure that there was adequate anteriorly as well as posteriorly. The stapler was then fired.  At this point I continued using 60 mm blue load staple cartridges with ESLR. The echelon stapler was then repositioned with a 60 mm blue load with ESLR and we continued to march up along the ViSiGi. My assistant was holding traction along the greater curvature stomach along the cauterized short gastric vessels ensuring that the stomach was symmetrically retracted. Prior to each firing of the staple, we rotated the stomach to ensure that there is adequate stomach left.  As we approached the fundus, I used 60 mm blue cartridge with ESLR aiming  lateral to the GE junction after mobilizing some of the esophageal fat pad.  The sleeve was inspected. There is no evidence of cork screw. The staple line appeared hemostatic. The CRNA inflated the ViSiGi to the green zone and the upper abdomen was flooded with saline. There were no bubbles. The sleeve was decompressed and the ViSiGi removed.   Pneumoperitoneum was reduced to 8 mmHg so we can inspect for bleeding along the staple line.  My assistant scrubbed out and performed an upper endoscopy. The sleeve easily distended with air and the scope was easily advanced to the pylorus. There is no evidence of internal bleeding or cork screwing. There was no narrowing at the angularis. There is no evidence of bubbles. Please see her operative note for further details. The gastric sleeve was decompressed and the endoscope was removed.  The greater curvature the stomach was grasped with a laparoscopic grasper and removed from the 15 mm trocar site.  The liver retractor was removed. I then closed the 15 mm trocar site with 1 interrupted 0 Vicryl sutures through the fascia using the endoclose. The closure was viewed laparoscopically and it was airtight. Remaining local was then infiltrated in the preperitoneal spaces around the trocar sites. Pneumoperitoneum was released. All trocar sites were closed with a 4-0 Monocryl in a subcuticular fashion followed by the application of steri-strips, and bandaids. The patient was extubated and taken to the recovery room in stable condition. All needle, instrument, and sponge counts were correct x2. There are no immediate complications  (0) 60 mm green with ESLR (1) 60 mm gold with ESLR (5) 60 mm blue with ESLR  PLAN OF CARE: Admit to inpatient   PATIENT  DISPOSITION:  PACU - hemodynamically stable.   Delay start of Pharmacological VTE agent (>24hrs) due to surgical blood loss or risk of bleeding:  no  Camellia HERO. Tanda, MD, FACS FASMBS General, Bariatric, & Minimally Invasive Surgery [facility], [place]

## 2024-02-15 NOTE — Progress Notes (Signed)
 PHARMACY CONSULT FOR:  Risk Assessment for Post-Discharge VTE Following Bariatric Surgery  Procedure* laparoscopic sleeve gastrectomy   Sex Female  Black race No  Age (years) 52  BMI (kg/m2) 41.20  Operation duration (minutes) 60 minutes  History of VTE requiring treatment* No  Hypercoagulable condition* No  Liver disorder* Yes (Mild diffuse hepatic steatosis noted on pelvis MRI in 2021 and per [place] msg on 02/15/24, Dr. Tanda said pt  was noted to have fatty appearing liver  Pre-op venous stasis No  Pre-op functional health status Independent   Previous foregut or bariatric surg No  Post-op surgical site infection No  Transfusion intra- or post-op* No  Surgical site infection post-op No  Unplanned readmission No  Unplanned reoperation No  GI perforation/leak/obstruction* No  *specific risk factors for portomesenteric venous thrombosis   Predicted probability of 30-day post-discharge VTE:    automatic rule-in due to liver disorder estimated using the [facility] Calculator    Recommendation for Discharge: Enoxaparin  40 mg [place] q12h x 30 days post-discharge    Raven [name] is a 34 y.o. female who underwent laparoscopic sleeve gastrectomy on 02/15/2024.   Case start: 0931 Case end: 1031   Allergies[1]  Patient Measurements: Height: 5' 4 (162.6 cm) Weight: 108.9 kg (240 lb) IBW/kg (Calculated) : 54.7 Body mass index is 41.2 kg/m.  Recent Labs    02/15/24 1213  CREATININE 0.94   Estimated Creatinine Clearance: 101.7 mL/min (by C-G formula based on SCr of 0.94 mg/dL).    Past Medical History:  Diagnosis Date   Anxiety    Arthritis    Autoimmune disease 07/25/2020   Complication of anesthesia    Elevated liver enzymes    Endometriosis    Hypothyroidism    Medical history non-contributory    Otosclerosis    PONV (postoperative nausea and vomiting)    Pre-diabetes    Preeclampsia in postpartum period 02/11/2022   SVT  (supraventricular tachycardia)    Tachycardia      Medications Prior to Admission  Medication Sig Dispense Refill Last Dose/Taking   Cholecalciferol (VITAMIN D -3 PO) Take 1 tablet by mouth in the morning.   Past Week   levonorgestrel  (MIRENA , 52 MG,) 20 MCG/DAY IUD    02/15/2024 Morning   levothyroxine  (SYNTHROID ) 50 MCG tablet Take 1 tablet (50 mcg total) by mouth daily before breakfast. 90 tablet 3 02/15/2024 Morning   oseltamivir  (TAMIFLU ) 75 MG capsule Take 1 capsule (75 mg total) by mouth 2 (two) times daily. 10 capsule 0 Past Month   sertraline  (ZOLOFT ) 100 MG tablet Take 1 tablet (100 mg total) by mouth daily. 90 tablet 3 02/15/2024 Morning   tretinoin  (RETIN-A ) 0.025 % cream Apply topically at bedtime. 45 g 4 Past Month   benzonatate  (TESSALON ) 100 MG capsule Take 1-2 capsules (100-200 mg total) by mouth 3 (three) times daily as needed. 30 capsule 0 More than a month       Hristopher Missildine P 02/15/2024,1:26 PM      [1]  Allergies Allergen Reactions   Macrobid [Nitrofurantoin] Hives   Other     Walnuts - burns tongue    Sulfa Antibiotics Dermatitis   Hydroxychloroquine  Rash and Hives   Sulfamethoxazole-Trimethoprim Rash

## 2024-02-16 ENCOUNTER — Encounter: Payer: Self-pay | Admitting: Pharmacist

## 2024-02-16 ENCOUNTER — Other Ambulatory Visit (HOSPITAL_COMMUNITY): Payer: Self-pay

## 2024-02-16 ENCOUNTER — Other Ambulatory Visit: Payer: Self-pay

## 2024-02-16 ENCOUNTER — Encounter (HOSPITAL_COMMUNITY): Payer: Self-pay | Admitting: General Surgery

## 2024-02-16 LAB — COMPREHENSIVE METABOLIC PANEL WITH GFR
ALT: 54 U/L — ABNORMAL HIGH (ref 0–44)
AST: 43 U/L — ABNORMAL HIGH (ref 15–41)
Albumin: 4 g/dL (ref 3.5–5.0)
Alkaline Phosphatase: 63 U/L (ref 38–126)
Anion gap: 11 (ref 5–15)
BUN: 5 mg/dL — ABNORMAL LOW (ref 6–20)
CO2: 23 mmol/L (ref 22–32)
Calcium: 9.5 mg/dL (ref 8.9–10.3)
Chloride: 104 mmol/L (ref 98–111)
Creatinine, Ser: 0.77 mg/dL (ref 0.44–1.00)
GFR, Estimated: >60 mL/min
Glucose, Bld: 137 mg/dL — ABNORMAL HIGH (ref 70–99)
Potassium: 4.3 mmol/L (ref 3.5–5.1)
Sodium: 137 mmol/L (ref 135–145)
Total Bilirubin: 0.3 mg/dL (ref 0.0–1.2)
Total Protein: 7.1 g/dL (ref 6.5–8.1)

## 2024-02-16 LAB — CBC WITH DIFFERENTIAL/PLATELET
Abs Immature Granulocytes: 0.08 K/uL — ABNORMAL HIGH (ref 0.00–0.07)
Basophils Absolute: 0 K/uL (ref 0.0–0.1)
Basophils Relative: 0 %
Eosinophils Absolute: 0 K/uL (ref 0.0–0.5)
Eosinophils Relative: 0 %
HCT: 41.8 % (ref 36.0–46.0)
Hemoglobin: 14.3 g/dL (ref 12.0–15.0)
Immature Granulocytes: 1 %
Lymphocytes Relative: 11 %
Lymphs Abs: 1.9 K/uL (ref 0.7–4.0)
MCH: 30.6 pg (ref 26.0–34.0)
MCHC: 34.2 g/dL (ref 30.0–36.0)
MCV: 89.3 fL (ref 80.0–100.0)
Monocytes Absolute: 1.3 K/uL — ABNORMAL HIGH (ref 0.1–1.0)
Monocytes Relative: 8 %
Neutro Abs: 13.1 K/uL — ABNORMAL HIGH (ref 1.7–7.7)
Neutrophils Relative %: 80 %
Platelets: 221 K/uL (ref 150–400)
RBC: 4.68 MIL/uL (ref 3.87–5.11)
RDW: 12.1 % (ref 11.5–15.5)
WBC: 16.4 K/uL — ABNORMAL HIGH (ref 4.0–10.5)
nRBC: 0 % (ref 0.0–0.2)

## 2024-02-16 LAB — SURGICAL PATHOLOGY

## 2024-02-16 MED ORDER — GABAPENTIN 100 MG PO CAPS
100.0000 mg | ORAL_CAPSULE | Freq: Two times a day (BID) | ORAL | 0 refills | Status: AC
  Filled 2024-02-16: qty 10, 5d supply, fill #0

## 2024-02-16 MED ORDER — ONDANSETRON 4 MG PO TBDP
4.0000 mg | ORAL_TABLET | Freq: Four times a day (QID) | ORAL | 0 refills | Status: AC | PRN
  Filled 2024-02-16: qty 20, 5d supply, fill #0

## 2024-02-16 MED ORDER — PANTOPRAZOLE SODIUM 40 MG PO TBEC
40.0000 mg | DELAYED_RELEASE_TABLET | Freq: Every day | ORAL | 0 refills | Status: AC
  Filled 2024-02-16: qty 90, 90d supply, fill #0

## 2024-02-16 MED ORDER — ACETAMINOPHEN 500 MG PO TABS: 1000.0000 mg | ORAL_TABLET | Freq: Three times a day (TID) | ORAL | Status: AC

## 2024-02-16 MED ORDER — OXYCODONE HCL 5 MG PO TABS
5.0000 mg | ORAL_TABLET | Freq: Four times a day (QID) | ORAL | 0 refills | Status: AC | PRN
  Filled 2024-02-16: qty 5, 2d supply, fill #0

## 2024-02-16 MED ORDER — ENOXAPARIN SODIUM 40 MG/0.4ML IJ SOSY
40.0000 mg | PREFILLED_SYRINGE | Freq: Two times a day (BID) | INTRAMUSCULAR | 0 refills | Status: AC
  Filled 2024-02-16: qty 24, 30d supply, fill #0

## 2024-02-16 NOTE — Progress Notes (Signed)
 Discharge meds in a secure bag delivered to patient by this RN

## 2024-02-16 NOTE — Progress Notes (Signed)
" °   02/16/24 1118  TOC Brief Assessment  Insurance and Status Reviewed  Patient has primary care physician Yes  Home environment has been reviewed resides in a private residence  Prior level of function: Independent  Prior/Current Home Services No current home services  Social Drivers of Health Review SDOH reviewed no interventions necessary  Readmission risk has been reviewed Yes  Transition of care needs no transition of care needs at this time    "

## 2024-02-16 NOTE — Discharge Summary (Signed)
 Physician Discharge Summary  Raven [name] FMW:[id number] DOB: 1990-12-25 DOA: 02/15/2024  PCP: Jolinda Norene HERO, DO  Admit date: 02/15/2024 Discharge date: 02/16/2024  Recommendations for Outpatient Follow-up:    Follow-up Information     Tanda Locus, MD Follow up on 03/09/2024.   Specialty: General Surgery Why: Please arrive 15 minutes prior to your appointment at 9:30am Contact information: [address] [place] [address] [phone]         [name], [place] Follow up on 04/08/2024.   Specialty: General Surgery Why: Please arrive 15 minutes prior to your appointment at 9:15am with [name] on behalf of Dr. Tanda Pass information: [address] [facility] [place] [address] [phone]                Discharge Diagnoses:  Principal Problem:   S/P laparoscopic sleeve gastrectomy   Surgical Procedure: Laparoscopic Sleeve Gastrectomy, upper endoscopy  Discharge Condition: Good Disposition: Home  Diet recommendation: Postoperative sleeve gastrectomy diet (liquids only)  Filed Weights   02/15/24 0703  Weight: 108.9 kg     Hospital Course:  The patient was admitted for a planned laparoscopic sleeve gastrectomy. Please see operative note. Preoperatively the patient was given 5000 units of subcutaneous heparin  for DVT prophylaxis. Postoperative prophylactic heparin  dosing was started on the evening of postoperative day 0. ERAS protocol was used. On the evening of postoperative day 0, the patient was started on water  and ice chips. On postoperative day 1 the patient had no fever or tachycardia and was tolerating water  in their diet was gradually advanced throughout the day. The patient was ambulating without difficulty. Their vital signs are stable without fever or tachycardia. Their hemoglobin had remained stable. The patient had received discharge instructions and counseling. They were deemed  stable for discharge and had met discharge criteria  SHe met criteria for extended VTE prophylaxis on discharge.  She received Lovenox  teaching.  BP 122/84 (BP Location: Left Arm)   Pulse 91   Temp 98.9 F (37.2 C)   Resp 16   Ht 5' 4 (1.626 m)   Wt 108.9 kg   SpO2 100%   BMI 41.20 kg/m   Gen: alert, NAD, non-toxic appearing Pupils: equal, no scleral icterus Pulm:  symmetric chest rise CV: regular rate and rhythm Abd: soft, mild approp tender, nondistended.  No cellulitis. No incisional hernia Ext: no edema, no calf tenderness Skin: no rash, no jaundice   Discharge Instructions  Discharge Instructions     Ambulate hourly while awake   Complete by: As directed    Call MD for:  difficulty breathing, headache or visual disturbances   Complete by: As directed    Call MD for:  persistant dizziness or light-headedness   Complete by: As directed    Call MD for:  persistant nausea and vomiting   Complete by: As directed    Call MD for:  redness, tenderness, or signs of infection (pain, swelling, redness, odor or green/yellow discharge around incision site)   Complete by: As directed    Call MD for:  severe uncontrolled pain   Complete by: As directed    Call MD for:  temperature >101 F   Complete by: As directed    Diet bariatric full liquid   Complete by: As directed    Discharge instructions   Complete by: As directed    See bariatric discharge instructions   Incentive spirometry   Complete by: As directed  Perform hourly while awake      Allergies as of 02/16/2024       Reactions   Macrobid [nitrofurantoin] Hives   Other    Walnuts - burns tongue    Sulfa Antibiotics Dermatitis   Hydroxychloroquine  Rash, Hives   Sulfamethoxazole-trimethoprim Rash        Medication List     STOP taking these medications    benzonatate  100 MG capsule Commonly known as: TESSALON    oseltamivir  75 MG capsule Commonly known as: Tamiflu        TAKE these medications     acetaminophen  500 MG tablet Commonly known as: TYLENOL  Take 2 tablets (1,000 mg total) by mouth every 8 (eight) hours for 5 days. Notes to patient: 6 am,  2 pm, 10 pm   enoxaparin  40 MG/0.4ML injection Commonly known as: LOVENOX  Inject 0.4 mLs (40 mg total) into the skin every 12 (twelve) hours.   gabapentin  100 MG capsule Commonly known as: NEURONTIN  Take 1 capsule (100 mg total) by mouth every 12 (twelve) hours. Notes to patient: 9 am & 9 pm   levothyroxine  50 MCG tablet Commonly known as: SYNTHROID  Take 1 tablet (50 mcg total) by mouth daily before breakfast.   Mirena  (52 MG) 20 MCG/DAY Iud Generic drug: levonorgestrel    ondansetron  4 MG disintegrating tablet Commonly known as: ZOFRAN -ODT Take 1 tablet (4 mg total) by mouth every 6 (six) hours as needed for nausea or vomiting.   oxyCODONE  5 MG immediate release tablet Commonly known as: Oxy IR/ROXICODONE  Take 1 tablet (5 mg total) by mouth every 6 (six) hours as needed for breakthrough pain or severe pain (pain score 7-10).   pantoprazole  40 MG tablet Commonly known as: PROTONIX  Take 1 tablet (40 mg total) by mouth daily.   Retin-A  0.025 % cream Generic drug: tretinoin  Apply topically at bedtime.   sertraline  100 MG tablet Commonly known as: ZOLOFT  Take 1 tablet (100 mg total) by mouth daily.   VITAMIN D -3 PO Take 1 tablet by mouth in the morning.        Follow-up Information     Tanda Locus, MD Follow up on 03/09/2024.   Specialty: General Surgery Why: Please arrive 15 minutes prior to your appointment at 9:30am Contact information: [address] [place] [address] [phone]         [name], [place] Follow up on 04/08/2024.   Specialty: General Surgery Why: Please arrive 15 minutes prior to your appointment at 9:15am with [name] on behalf of Dr. Tanda Pass information: [address] [facility] [place]  [address] [phone]                  The results of significant diagnostics from this hospitalization (including imaging, microbiology, ancillary and laboratory) are listed below for reference.    Significant Diagnostic Studies: No results found.  Labs: Basic Metabolic Panel: Recent Labs  Lab 02/15/24 1213 02/16/24 0503  NA  --  137  K  --  4.3  CL  --  104  CO2  --  23  GLUCOSE  --  137*  BUN  --  5*  CREATININE 0.94 0.77  CALCIUM   --  9.5   Liver Function Tests: Recent Labs  Lab 02/16/24 0503  AST 43*  ALT 54*  ALKPHOS 63  BILITOT 0.3  PROT 7.1  ALBUMIN 4.0    CBC: Recent Labs  Lab 02/15/24 1355 02/16/24 0503  WBC  --  16.4*  NEUTROABS  --  13.1*  HGB 14.6 14.3  HCT 43.1 41.8  MCV  --  89.3  PLT  --  221    CBG: No results for input(s): GLUCAP in the last 168 hours.  Principal Problem:   S/P laparoscopic sleeve gastrectomy   Time coordinating discharge: 15 min  Signed:  Camellia CHRISTELLA Blush, MD [facility] [phone] 02/16/2024, 4:38 PM

## 2024-02-16 NOTE — Progress Notes (Signed)
 Patient alert and oriented, pain is controlled. Patient is tolerating fluids, advanced to protein shake today, patient is tolerating well. Reviewed Gastric sleeve/bypass discharge instructions with patient and patient is able to articulate understanding. Provided information on BELT program, Support Group, BSTOP-D, and [facility] outpatient pharmacy. Communicated general update of patient status to surgeon. All questions answered. 24hr fluid recall is 250 mL  per hydration protocol, bariatric nurse coordinator to make follow-up phone call within one week.   Thank you,  Roseann Medley, RN, MSN Bariatric Nurse Coordinator [phone] (office)

## 2024-02-16 NOTE — Progress Notes (Signed)
 Pt verbalized she has finished 5 cups(2 ounces each) of water .

## 2024-02-18 ENCOUNTER — Telehealth: Payer: Self-pay | Admitting: Skilled Nursing Facility1

## 2024-02-18 ENCOUNTER — Encounter: Payer: Self-pay | Admitting: Skilled Nursing Facility1

## 2024-02-18 NOTE — Telephone Encounter (Signed)
 Pt called enquiring about choices food broth.   Advised pt to ensure she strains in properly.

## 2024-02-23 ENCOUNTER — Telehealth: Payer: Self-pay | Admitting: Dietician

## 2024-02-23 NOTE — Telephone Encounter (Signed)
 Pt called with questions about advancing from the modified full liquid diet, stating she is doing really well with everything so far. Dietitian advised that she continue the modified full liquid diet (protein). Pt is scheduled for the post-op class a week from today, and will advance to solid foods. Dietitian advised that the first week adding in solid foods, is still just focusing on protein. Pt expressed understanding and all questions were answered to patients satisfaction.

## 2024-02-24 ENCOUNTER — Telehealth (HOSPITAL_COMMUNITY): Payer: Self-pay | Admitting: *Deleted

## 2024-02-24 NOTE — Telephone Encounter (Signed)
 Post-op follow-up  Mail box is full messages couldn't be left

## 2024-02-25 ENCOUNTER — Telehealth (HOSPITAL_COMMUNITY): Payer: Self-pay | Admitting: Pharmacist

## 2024-02-25 ENCOUNTER — Telehealth (HOSPITAL_COMMUNITY): Payer: Self-pay | Admitting: *Deleted

## 2024-02-25 DIAGNOSIS — E860 Dehydration: Secondary | ICD-10-CM | POA: Insufficient documentation

## 2024-02-25 NOTE — Telephone Encounter (Signed)
 1. Tell me about your pain and pain management?     Pt denies any pain.  2. Let's talk about fluid intake. How much total fluid are you taking in?   Pt states that s/he is getting in at least 20 oz of fluid including protein shakes, bottled water , jello and soup for the past 3 days. Pt states that she feels fine, but has noticed her mouth is dry.  Pt instructed to assess status and suggestions daily utilizing Hydration Action Plan on discharge folder and to go ahead and call [facility], especially if in the red zone.      3. How much protein have you taken in the last day?   Pt states that she is working to meet the goal of 60g of protein today. Pt plans to drink the reminder of goal throughout the day to meet criteria. Patient stated that she has had aversions to the sweet shake flavors    4. Have you had nausea? Tell me about when you have experienced nausea and what you did to help?   Pt denies nausea - unless associated with the sweetened protein shakes  5. Has the frequency or color changed with your urine?   Pt states that s/he is urinating fine with no changes in frequency or urgency.    6. Have you been passing gas? BM?   Pt states that they are having BMs.   Pt states that they have had a BM. Pt instructed to take either Miralax or MoM as instructed per Gastric Bypass/Sleeve Discharge Home Care Instructions. Pt to call surgeon's office if not able to have BM with medication.    7. If a problem or question were to arise who would you call? Do you know contact numbers for [facility], [facility], and [facility]?   Pt knows to call [facility] for surgical, [facility] for nutrition, and [facility] for non-urgent questions or concerns. Pt denies dehydration symptoms. Pt can describe s/sx of dehydration.   8. How has the walking going?   Pt states s/he is walking around and able to be active without difficulty.    9. How are your vitamins and calcium  going? How are you taking them?     Pt states that s/he is  taking his/her supplements and vitamins without difficulty.

## 2024-02-25 NOTE — Telephone Encounter (Signed)
 Received IV fluids order  Direction: 2 Liters NS - - 1 Liter administer over one hour  Sherry Pennant, PharmD, MPH, BCPS, CPP Clinical Pharmacist

## 2024-02-26 ENCOUNTER — Encounter: Attending: General Surgery | Admitting: Emergency Medicine

## 2024-02-26 VITALS — BP 117/82 | HR 88 | Temp 97.9°F | Resp 16

## 2024-02-26 DIAGNOSIS — E860 Dehydration: Secondary | ICD-10-CM

## 2024-02-26 MED ORDER — SODIUM CHLORIDE 0.9 % IV BOLUS
2000.0000 mL | Freq: Once | INTRAVENOUS | Status: AC
  Administered 2024-02-26: 2000 mL via INTRAVENOUS
  Filled 2024-02-26: qty 2000

## 2024-02-26 NOTE — Progress Notes (Signed)
 Diagnosis:  Dehydration  Provider:  Camellia Blush MD  Procedure: IV Infusion  IV Type: Peripheral, IV Location: L Antecubital   Normal Saline, Dose: 2000 mLs  Infusion Start Time: 0923  Infusion Stop Time: 1130  Post Infusion IV Care: Peripheral IV Discontinued  Discharge: Condition: Good, Destination: Home . AVS Declined  Performed by:  Delon ONEIDA Officer, RN

## 2024-03-01 ENCOUNTER — Encounter: Attending: General Surgery | Admitting: Dietician

## 2024-03-01 ENCOUNTER — Encounter: Payer: Self-pay | Admitting: Dietician

## 2024-03-01 VITALS — Ht 64.0 in | Wt 223.8 lb

## 2024-03-01 DIAGNOSIS — E669 Obesity, unspecified: Secondary | ICD-10-CM | POA: Insufficient documentation

## 2024-03-01 NOTE — Progress Notes (Signed)
 2 Week Post-Operative Nutrition Follow-up   Start Time: 1520   End Time: 1604  Patient was seen on 03/01/2024 for Post-Operative Nutrition education at [facility]    Surgery date: 02/15/2024 Surgery type: Sleeve Gastrectomy  Anthropometrics  Start weight at [facility]: 244.5 lbs (date: 10/08/2023)  Height: 64 in Weight today: 223.8 lbs   Clinical  Medical hx: obesity Medications: synthroid , Zoloft , vit D, mirena , retin-A   Labs: see EMR Allergies: sulfa, hydro chlorquine, walnuts Notable signs/symptoms: none noted Any previous deficiencies? No Bowel Habits: Every day to every other day no complaints   Body Composition Scale 03/01/2024  Current Body Weight 223.8  Total Body Fat % 41.5  Visceral Fat 11  Fat-Free Mass % 58.4   Total Body Water  % 43.7  Muscle-Mass lbs 32.6  BMI 38.3  Body Fat Displacement          Torso  lbs 57.6         Left Leg  lbs 11.5         Right Leg  lbs 11.5         Left Arm  lbs 5.7         Right Arm  lbs 5.7    The following the learning objectives were met by the patient during this course: Identifies Soft Prepped Plan Advancement Guide  Identifies Soft, High Proteins (Phase 1), beginning 2 weeks post-operatively to 3 weeks post-operatively Identifies Additional Soft High Proteins, soft non-starchy vegetables, fruits and starches (Phase 2), beginning 3 weeks post-operatively to 3 months post-operatively Identifies appropriate sources of fluids, proteins, vegetables, fruits and starches Identifies appropriate fat sources and healthy verses unhealthy fat types   States protein, vegetable, fruit and starch recommendations and appropriate sources post-operatively Identifies the need for appropriate texture modifications, mastication, and bite sizes when consuming solids Identifies appropriate fat consumption and sources Identifies appropriate multivitamin and calcium  sources post-operatively Describes the need for  physical activity post-operatively and will follow MD recommendations States when to call healthcare provider regarding medication questions or post-operative complications   Handouts given during class include: Soft Prepped Plan Advancement Guide   Follow-Up Plan: Patient will follow-up at [facility] in 10 weeks for 3 month post-op nutrition visit for diet advancement per MD.

## 2024-03-10 ENCOUNTER — Telehealth: Payer: Self-pay | Admitting: Dietician

## 2024-03-10 NOTE — Telephone Encounter (Signed)
 RD called pt to verify fluid intake once starting soft, solid proteins 2 week post-bariatric surgery.   Daily Fluid intake: 64 oz Daily Protein intake: 60 grams Bowel Habits: every other day; no concerns  Concerns/issues: Pt states everything is going well; no concerns or questions

## 2024-04-05 ENCOUNTER — Encounter: Admitting: Dietician

## 2024-05-16 ENCOUNTER — Encounter: Admitting: Dietician

## 2024-10-18 ENCOUNTER — Encounter: Payer: Self-pay | Admitting: Family Medicine
